# Patient Record
Sex: Female | Born: 1950
Health system: Southern US, Community
[De-identification: ages and names within clinical notes are randomized; demographics above are authoritative.]

## PROBLEM LIST (undated history)

## (undated) DIAGNOSIS — K760 Fatty (change of) liver, not elsewhere classified: Secondary | ICD-10-CM

## (undated) DIAGNOSIS — E785 Hyperlipidemia, unspecified: Secondary | ICD-10-CM

## (undated) DIAGNOSIS — R748 Abnormal levels of other serum enzymes: Secondary | ICD-10-CM

## (undated) DIAGNOSIS — F419 Anxiety disorder, unspecified: Secondary | ICD-10-CM

## (undated) DIAGNOSIS — B019 Varicella without complication: Secondary | ICD-10-CM

## (undated) DIAGNOSIS — I10 Essential (primary) hypertension: Secondary | ICD-10-CM

## (undated) DIAGNOSIS — B029 Zoster without complications: Secondary | ICD-10-CM

## (undated) DIAGNOSIS — Z973 Presence of spectacles and contact lenses: Secondary | ICD-10-CM

## (undated) DIAGNOSIS — R42 Dizziness and giddiness: Secondary | ICD-10-CM

## (undated) DIAGNOSIS — M1990 Unspecified osteoarthritis, unspecified site: Secondary | ICD-10-CM

## (undated) DIAGNOSIS — F32A Depression, unspecified: Secondary | ICD-10-CM

## (undated) DIAGNOSIS — F329 Major depressive disorder, single episode, unspecified: Secondary | ICD-10-CM

## (undated) HISTORY — DX: Hyperlipidemia, unspecified: E78.5

## (undated) HISTORY — DX: Abnormal levels of other serum enzymes: R74.8

## (undated) HISTORY — DX: Varicella without complication: B01.9

## (undated) HISTORY — DX: Anxiety disorder, unspecified: F41.9

## (undated) HISTORY — DX: Essential (primary) hypertension: I10

## (undated) HISTORY — DX: Depression, unspecified: F32.A

## (undated) HISTORY — DX: Major depressive disorder, single episode, unspecified: F32.9

## (undated) HISTORY — DX: Fatty (change of) liver, not elsewhere classified: K76.0

---
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
## 1984-06-23 HISTORY — PX: DILATION AND CURETTAGE OF UTERUS: SHX78

## 2014-10-31 ENCOUNTER — Ambulatory Visit: Payer: Self-pay | Admitting: Internal Medicine

## 2014-11-13 ENCOUNTER — Ambulatory Visit (INDEPENDENT_AMBULATORY_CARE_PROVIDER_SITE_OTHER): Payer: Managed Care, Other (non HMO) | Admitting: Internal Medicine

## 2014-11-13 ENCOUNTER — Encounter (INDEPENDENT_AMBULATORY_CARE_PROVIDER_SITE_OTHER): Payer: Self-pay

## 2014-11-13 ENCOUNTER — Encounter: Payer: Self-pay | Admitting: Internal Medicine

## 2014-11-13 VITALS — BP 150/100 | HR 65 | Temp 98.6°F | Ht 63.75 in | Wt 153.0 lb

## 2014-11-13 DIAGNOSIS — K700 Alcoholic fatty liver: Secondary | ICD-10-CM | POA: Insufficient documentation

## 2014-11-13 DIAGNOSIS — F32A Depression, unspecified: Secondary | ICD-10-CM

## 2014-11-13 DIAGNOSIS — F419 Anxiety disorder, unspecified: Secondary | ICD-10-CM

## 2014-11-13 DIAGNOSIS — Z720 Tobacco use: Secondary | ICD-10-CM

## 2014-11-13 DIAGNOSIS — E785 Hyperlipidemia, unspecified: Secondary | ICD-10-CM | POA: Diagnosis not present

## 2014-11-13 DIAGNOSIS — I10 Essential (primary) hypertension: Secondary | ICD-10-CM | POA: Diagnosis not present

## 2014-11-13 DIAGNOSIS — F329 Major depressive disorder, single episode, unspecified: Secondary | ICD-10-CM | POA: Insufficient documentation

## 2014-11-13 DIAGNOSIS — F418 Other specified anxiety disorders: Secondary | ICD-10-CM

## 2014-11-13 DIAGNOSIS — F17200 Nicotine dependence, unspecified, uncomplicated: Secondary | ICD-10-CM

## 2014-11-13 MED ORDER — ATORVASTATIN CALCIUM 20 MG PO TABS: 20.0000 mg | ORAL_TABLET | Freq: Every day | ORAL | Status: DC

## 2014-11-13 MED ORDER — MIRTAZAPINE 15 MG PO TABS: 15.0000 mg | ORAL_TABLET | Freq: Every day | ORAL | Status: DC

## 2014-11-13 MED ORDER — AMLODIPINE BESYLATE-VALSARTAN 5-320 MG PO TABS: 1.0000 | ORAL_TABLET | Freq: Every day | ORAL | Status: DC

## 2014-11-13 MED ORDER — PRISTIQ 100 MG PO TB24: 100.0000 mg | ORAL_TABLET | Freq: Every day | ORAL | Status: DC

## 2014-11-13 NOTE — Assessment & Plan Note (Signed)
Will check CMET and Lipid profile at her next visit Encouraged her to consume a low fat diet

## 2014-11-13 NOTE — Assessment & Plan Note (Signed)
Handout given on smoking cessation: tips for success

## 2014-11-13 NOTE — Progress Notes (Signed)
Pre visit review using our clinic review tool, if applicable. No additional management support is needed unless otherwise documented below in the visit note. 
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
## 2014-11-13 NOTE — Assessment & Plan Note (Signed)
Discussed how chronic alcohol intake affects the liver She understands but is not ready to abstain at this time Discussed referral for therapy to better help her manage her anxiety and depression so that she can cut back on the alcohol, she declines at this time

## 2014-11-13 NOTE — Assessment & Plan Note (Signed)
BP elevated today but she seems very anxious Will check CBC and CMET at next visit Advised her to keep a record of her home BP and bring them to me at her next visit Discussed DASH diet

## 2014-11-13 NOTE — Patient Instructions (Signed)
Smoking Cessation, Tips for Success  If you are ready to quit smoking, congratulations! You have chosen to help yourself be healthier. Cigarettes bring nicotine, tar, carbon monoxide, and other irritants into your body. Your lungs, heart, and blood vessels will be able to work better without these poisons. There are many different ways to quit smoking. Nicotine gum, nicotine patches, a nicotine inhaler, or nicotine nasal spray can help with physical craving. Hypnosis, support groups, and medicines help break the habit of smoking.  WHAT THINGS CAN I DO TO MAKE QUITTING EASIER?   Here are some tips to help you quit for good:  · Pick a date when you will quit smoking completely. Tell all of your friends and family about your plan to quit on that date.  · Do not try to slowly cut down on the number of cigarettes you are smoking. Pick a quit date and quit smoking completely starting on that day.  · Throw away all cigarettes.    · Clean and remove all ashtrays from your home, work, and car.  · On a card, write down your reasons for quitting. Carry the card with you and read it when you get the urge to smoke.  · Cleanse your body of nicotine. Drink enough water and fluids to keep your urine clear or pale yellow. Do this after quitting to flush the nicotine from your body.  · Learn to predict your moods. Do not let a bad situation be your excuse to have a cigarette. Some situations in your life might tempt you into wanting a cigarette.  · Never have "just one" cigarette. It leads to wanting another and another. Remind yourself of your decision to quit.  · Change habits associated with smoking. If you smoked while driving or when feeling stressed, try other activities to replace smoking. Stand up when drinking your coffee. Brush your teeth after eating. Sit in a different chair when you read the paper. Avoid alcohol while trying to quit, and try to drink fewer caffeinated beverages. Alcohol and caffeine may urge you to  smoke.  · Avoid foods and drinks that can trigger a desire to smoke, such as sugary or spicy foods and alcohol.  · Ask people who smoke not to smoke around you.  · Have something planned to do right after eating or having a cup of coffee. For example, plan to take a walk or exercise.  · Try a relaxation exercise to calm you down and decrease your stress. Remember, you may be tense and nervous for the first 2 weeks after you quit, but this will pass.  · Find new activities to keep your hands busy. Play with a pen, coin, or rubber band. Doodle or draw things on paper.  · Brush your teeth right after eating. This will help cut down on the craving for the taste of tobacco after meals. You can also try mouthwash.    · Use oral substitutes in place of cigarettes. Try using lemon drops, carrots, cinnamon sticks, or chewing gum. Keep them handy so they are available when you have the urge to smoke.  · When you have the urge to smoke, try deep breathing.  · Designate your home as a nonsmoking area.  · If you are a heavy smoker, ask your health care provider about a prescription for nicotine chewing gum. It can ease your withdrawal from nicotine.  · Reward yourself. Set aside the cigarette money you save and buy yourself something nice.  · Look for   support from others. Join a support group or smoking cessation program. Ask someone at home or at work to help you with your plan to quit smoking.  · Always ask yourself, "Do I need this cigarette or is this just a reflex?" Tell yourself, "Today, I choose not to smoke," or "I do not want to smoke." You are reminding yourself of your decision to quit.  · Do not replace cigarette smoking with electronic cigarettes (commonly called e-cigarettes). The safety of e-cigarettes is unknown, and some may contain harmful chemicals.  · If you relapse, do not give up! Plan ahead and think about what you will do the next time you get the urge to smoke.  HOW WILL I FEEL WHEN I QUIT SMOKING?  You  may have symptoms of withdrawal because your body is used to nicotine (the addictive substance in cigarettes). You may crave cigarettes, be irritable, feel very hungry, cough often, get headaches, or have difficulty concentrating. The withdrawal symptoms are only temporary. They are strongest when you first quit but will go away within 10-14 days. When withdrawal symptoms occur, stay in control. Think about your reasons for quitting. Remind yourself that these are signs that your body is healing and getting used to being without cigarettes. Remember that withdrawal symptoms are easier to treat than the major diseases that smoking can cause.   Even after the withdrawal is over, expect periodic urges to smoke. However, these cravings are generally short lived and will go away whether you smoke or not. Do not smoke!  WHAT RESOURCES ARE AVAILABLE TO HELP ME QUIT SMOKING?  Your health care provider can direct you to community resources or hospitals for support, which may include:  · Group support.  · Education.  · Hypnosis.  · Therapy.  Document Released: 03/07/2004 Document Revised: 10/24/2013 Document Reviewed: 11/25/2012  Exit[employer]® Patient Information ©2015 [employer]. This information is not intended to replace advice given to you by your health care provider. Make sure you discuss any questions you have with your health care provider.
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
## 2014-12-08 ENCOUNTER — Encounter: Payer: Self-pay | Admitting: Internal Medicine

## 2014-12-08 ENCOUNTER — Ambulatory Visit (INDEPENDENT_AMBULATORY_CARE_PROVIDER_SITE_OTHER): Payer: Managed Care, Other (non HMO) | Admitting: Internal Medicine

## 2014-12-08 ENCOUNTER — Other Ambulatory Visit (HOSPITAL_COMMUNITY)
Admission: RE | Admit: 2014-12-08 | Discharge: 2014-12-08 | Disposition: A | Discharge status: Home / Self Care | Payer: Managed Care, Other (non HMO) | Source: Ambulatory Visit | Attending: Internal Medicine | Admitting: Internal Medicine

## 2014-12-08 VITALS — BP 148/94 | HR 78 | Temp 98.9°F | Ht 63.75 in | Wt 147.0 lb

## 2014-12-08 DIAGNOSIS — Z13820 Encounter for screening for osteoporosis: Secondary | ICD-10-CM | POA: Diagnosis not present

## 2014-12-08 DIAGNOSIS — Z1211 Encounter for screening for malignant neoplasm of colon: Secondary | ICD-10-CM | POA: Diagnosis not present

## 2014-12-08 DIAGNOSIS — Z01419 Encounter for gynecological examination (general) (routine) without abnormal findings: Secondary | ICD-10-CM | POA: Diagnosis not present

## 2014-12-08 DIAGNOSIS — Z124 Encounter for screening for malignant neoplasm of cervix: Secondary | ICD-10-CM

## 2014-12-08 DIAGNOSIS — Z1239 Encounter for other screening for malignant neoplasm of breast: Secondary | ICD-10-CM

## 2014-12-08 DIAGNOSIS — Z1151 Encounter for screening for human papillomavirus (HPV): Secondary | ICD-10-CM | POA: Insufficient documentation

## 2014-12-08 DIAGNOSIS — Z0000 Encounter for general adult medical examination without abnormal findings: Secondary | ICD-10-CM | POA: Diagnosis not present

## 2014-12-08 LAB — CBC
HCT: 38.9 % (ref 36.0–46.0)
Hemoglobin: 13.3 g/dL (ref 12.0–15.0)
MCH: 32.1 pg (ref 26.0–34.0)
MCHC: 34.2 g/dL (ref 30.0–36.0)
MCV: 94 fL (ref 78.0–100.0)
MPV: 9.9 fL (ref 8.6–12.4)
Platelets: 185 10*3/uL (ref 150–400)
RBC: 4.14 MIL/uL (ref 3.87–5.11)
RDW: 13.3 % (ref 11.5–15.5)
WBC: 6.9 10*3/uL (ref 4.0–10.5)

## 2014-12-08 LAB — COMPREHENSIVE METABOLIC PANEL
ALT: 16 U/L (ref 0–35)
AST: 15 U/L (ref 0–37)
Albumin: 4.4 g/dL (ref 3.5–5.2)
Alkaline Phosphatase: 69 U/L (ref 39–117)
BILIRUBIN TOTAL: 0.5 mg/dL (ref 0.2–1.2)
BUN: 12 mg/dL (ref 6–23)
CHLORIDE: 99 meq/L (ref 96–112)
CO2: 28 mEq/L (ref 19–32)
Calcium: 9.4 mg/dL (ref 8.4–10.5)
Creat: 0.68 mg/dL (ref 0.50–1.10)
Glucose, Bld: 81 mg/dL (ref 70–99)
Potassium: 4.4 mEq/L (ref 3.5–5.3)
Sodium: 135 mEq/L (ref 135–145)
Total Protein: 6.3 g/dL (ref 6.0–8.3)

## 2014-12-08 LAB — LIPID PANEL
Cholesterol: 133 mg/dL (ref 0–200)
HDL: 47 mg/dL (ref >=46)
LDL Cholesterol: 73 mg/dL (ref 0–99)
TRIGLYCERIDES: 67 mg/dL (ref <150)
Total CHOL/HDL Ratio: 2.8 Ratio
VLDL: 13 mg/dL (ref 0–40)

## 2014-12-08 NOTE — Addendum Note (Signed)
Addended by: Roena Malady on: 12/08/2014 03:52 PM   Modules accepted: Orders

## 2014-12-08 NOTE — Progress Notes (Signed)
Pre visit review using our clinic review tool, if applicable. No additional management support is needed unless otherwise documented below in the visit note. 

## 2014-12-08 NOTE — Patient Instructions (Signed)

## 2014-12-08 NOTE — Progress Notes (Signed)
Subjective:    Patient ID: Audrey [name], female    DOB: [birth year], 64 y.o.   MRN: [id number]  HPI  Pt presents to the clinic today for her annual exam.  Flu: 2014 Tetanus: 2012 Zostovax: 12/2013 Pap Smear: > 5 years ago Mammogram,: > 5 years ago Bone Density:  never Colonoocopy: 2013 Vision Screening: as needed Dentist: biannually   Diet: she is on a "stay fit" She does not eat a lot of fast food or processed fruits. She does consume low fat meat, fruits and veggies.  Exercise: She walks 3.7 miles per day.  Review of Systems      Past Medical History  Diagnosis Date  . Hyperlipidemia   . Hypertension   . Depression   . Chicken pox   . Fatty liver   . Elevated liver enzymes     Current Outpatient Prescriptions  Medication Sig Dispense Refill  . amLODipine-valsartan (EXFORGE) 5-320 MG per tablet Take 1 tablet by mouth daily. 90 tablet 1  . atorvastatin (LIPITOR) 20 MG tablet Take 1 tablet (20 mg total) by mouth daily at 6 PM. 90 tablet 1  . metoprolol (LOPRESSOR) 100 MG tablet Take 100 mg by mouth 2 (two) times daily.    . mirtazapine (REMERON) 15 MG tablet Take 1 tablet (15 mg total) by mouth at bedtime. 90 tablet 1  . PRISTIQ 100 MG 24 hr tablet Take 1 tablet (100 mg total) by mouth daily. 90 tablet 1   No current facility-administered medications for this visit.    Allergies  Allergen Reactions  . Zoloft [Sertraline Hcl] Diarrhea  . Lisinopril Rash  . Wellbutrin [Bupropion] Rash    Family History  Problem Relation Age of Onset  . Multiple sclerosis Mother   . Mental illness Mother   . Arthritis Father   . Cancer Maternal Aunt     colon  . Mental illness Maternal Aunt   . Heart disease Maternal Grandmother   . Arthritis Paternal Grandmother   . Hyperlipidemia Paternal Grandmother   . Hypertension Paternal Grandmother   . Heart disease Paternal Grandfather   . Hypertension Paternal Grandfather   . Mental illness Paternal Grandfather      History   Social History  . Marital Status: Married    Spouse Name: N/A  . Number of Children: N/A  . Years of Education: N/A   Occupational History  . Not on file.   Social History Main Topics  . Smoking status: Current Every Day Smoker -- 1.00 packs/day    Types: Cigarettes  . Smokeless tobacco: Never Used  . Alcohol Use: 15.0 oz/week    0 Standard drinks or equivalent, 15 Glasses of wine, 10 Cans of beer per week     Comment: moderate--red wine and beer--daily at times   . Drug Use: Yes    Special: Marijuana  . Sexual Activity: Yes   Other Topics Concern  . Not on file   Social History Narrative     Constitutional: Denies fever, malaise, fatigue, headache or abrupt weight changes.  HEENT: Denies eye pain, eye redness, ear pain, ringing in the ears, wax buildup, runny nose, nasal congestion, bloody nose, or sore throat. Respiratory: Denies difficulty breathing, shortness of breath, cough or sputum production.   Cardiovascular: Denies chest pain, chest tightness, palpitations or swelling in the hands or feet.  Gastrointestinal: Denies abdominal pain, bloating, constipation, diarrhea or blood in the stool.  GU: Denies urgency, frequency, pain with urination, burning sensation, blood in urine,  odor or discharge. Musculoskeletal: Denies decrease in range of motion, difficulty with gait, muscle pain or joint pain and swelling.  Skin: Denies redness, rashes, lesions or ulcercations.  Neurological: Denies dizziness, difficulty with memory, difficulty with speech or problems with balance and coordination.  Psych: Pt reports anxiety. Denies depression, SI/HI.  No other specific complaints in a complete review of systems (except as listed in HPI above).  Objective:   Physical Exam   BP 148/94 mmHg  Pulse 78  Temp(Src) 98.9 F (37.2 C) (Oral)  Ht 5' 3.75" (1.619 m)  Wt 147 lb (66.679 kg)  BMI 25.44 kg/m2  SpO2 98% Wt Readings from Last 3 Encounters:  12/08/14 147  lb (66.679 kg)  11/13/14 153 lb (69.4 kg)    Constitutional:  Alert, oriented x 4, well developed, well nourished in no apparent distress. Skin: Skin is warm and dry.  No erythema, lesion or ulceration noted. HEENT: Head: normal shape and size; Eyes: sclera white, no icterus, conjunctiva pink, PERRLA and EOMs intact; Ears: Tm's gray and intact, normal light reflex; Nose: mucosa pink and moist, septum midline; Throat/Mouth: Teeth present, , mucosa pink and moist, no lesions or ulcerations noted. Neck:  Neck supple, trachea midline. No masses, lumps or thyromegaly present.  Cardiovascular: Normal rate and rhythm. S1,S2 noted.  No murmur, rubs or gallops noted. No JVD or BLE edema. No carotid bruits noted. Pulmonary/Chest: Normal effort and positive vesicular breath sounds. No respiratory distress. No wheezes, rales or ronchi noted.  Abdomenl: Soft and nontender. Normal bowel sounds, no bruits noted. No distention or masses noted. Liver, spleen and kidneys non palpable. Genitourinary: Normal female anatomy. Uterus midline, anterior and soft. No CMT or discharge noted. [place] non palpable. Breast without lumps or masses.  Musculoskeletal: Normal range of motion. Patient exhibits no effusions.  Neurological: Alert and oriented. Cranial nerves II-XII grossly intact. Coordination normal.  Psychiatric: She seems anxious. Behavior is normal. Judgment and thought content normal.        Assessment & Plan:   Preventative Health Maintenance:  Encouraged her to get a flu shot in the fall Tetanus UTD Pap smear obtained today, will call you with the results Mammogram ordered, she will call [facility] to schedule Bone density ordered, see Shirlee Limerick to schedule GI referral for repeat colonoscopy, see Shirlee Limerick to schedule Encouraged her to see a eye doctor at least annually CBC, CMET, Lipid and Vit D today  RTC in 6 months to follow up chronic conditions

## 2014-12-09 LAB — VITAMIN D 25 HYDROXY (VIT D DEFICIENCY, FRACTURES): Vit D, 25-Hydroxy: 51 ng/mL (ref 30–100)

## 2014-12-11 ENCOUNTER — Encounter: Payer: Self-pay | Admitting: Internal Medicine

## 2014-12-12 LAB — CYTOLOGY - PAP

## 2014-12-19 ENCOUNTER — Ambulatory Visit
Admission: RE | Admit: 2014-12-19 | Discharge: 2014-12-19 | Disposition: A | Discharge status: Home / Self Care | Payer: Managed Care, Other (non HMO) | Source: Ambulatory Visit | Attending: Internal Medicine | Admitting: Internal Medicine

## 2014-12-19 DIAGNOSIS — Z1231 Encounter for screening mammogram for malignant neoplasm of breast: Secondary | ICD-10-CM | POA: Diagnosis present

## 2014-12-19 DIAGNOSIS — Z1239 Encounter for other screening for malignant neoplasm of breast: Secondary | ICD-10-CM

## 2014-12-19 DIAGNOSIS — Z13820 Encounter for screening for osteoporosis: Secondary | ICD-10-CM

## 2014-12-21 ENCOUNTER — Encounter: Payer: Managed Care, Other (non HMO) | Admitting: Internal Medicine

## 2015-01-05 ENCOUNTER — Other Ambulatory Visit: Payer: Self-pay

## 2015-01-05 MED ORDER — METOPROLOL SUCCINATE ER 100 MG PO TB24: 100.0000 mg | ORAL_TABLET | Freq: Every day | ORAL | Status: DC

## 2015-01-05 NOTE — Telephone Encounter (Signed)
This medication has not been filled by you, please advise if okay to refill

## 2015-01-05 NOTE — Telephone Encounter (Signed)
Med sent electronically

## 2015-03-01 ENCOUNTER — Encounter: Payer: Self-pay | Admitting: Gastroenterology

## 2015-03-01 ENCOUNTER — Ambulatory Visit (AMBULATORY_SURGERY_CENTER): Payer: Self-pay | Admitting: *Deleted

## 2015-03-01 VITALS — Ht 64.0 in | Wt 156.4 lb

## 2015-03-01 DIAGNOSIS — Z1211 Encounter for screening for malignant neoplasm of colon: Secondary | ICD-10-CM

## 2015-03-01 MED ORDER — NA SULFATE-K SULFATE-MG SULF 17.5-3.13-1.6 GM/177ML PO SOLN: ORAL | Status: DC

## 2015-03-01 NOTE — Progress Notes (Signed)
No allergies to eggs or soy. No problems with anesthesia.  Pt given Emmi instructions for colonoscopy  No oxygen use  No diet drug use  

## 2015-03-15 ENCOUNTER — Encounter: Payer: Self-pay | Admitting: Gastroenterology

## 2015-03-15 ENCOUNTER — Ambulatory Visit (AMBULATORY_SURGERY_CENTER): Payer: Managed Care, Other (non HMO) | Admitting: Gastroenterology

## 2015-03-15 VITALS — BP 123/73 | HR 53 | Temp 98.3°F | Resp 24 | Ht 64.0 in | Wt 156.0 lb

## 2015-03-15 DIAGNOSIS — Z1211 Encounter for screening for malignant neoplasm of colon: Secondary | ICD-10-CM | POA: Diagnosis not present

## 2015-03-15 MED ORDER — SODIUM CHLORIDE 0.9 % IV SOLN: 500.0000 mL | INTRAVENOUS | Status: DC

## 2015-03-15 NOTE — Op Note (Signed)
[facility] [address] [place], [address]   COLONOSCOPY PROCEDURE REPORT  PATIENT: Audrey [name], Audrey [name]  MR#: [id number] BIRTHDATE: September 07, 1950 , 64  yrs. old GENDER: female ENDOSCOPIST: Louis Meckel, MD REFERRED BY: Nicki Reaper, NP PROCEDURE DATE:  03/15/2015 PROCEDURE:   Colonoscopy, screening First Screening Colonoscopy - Avg.  risk and is 50 yrs.  old or older - No.  Prior Negative Screening - Now for repeat screening. 10 or more years since last screening  History of Adenoma - Now for follow-up colonoscopy & has been > or = to 3 yrs.  N/A  Polyps removed today? No Recommend repeat exam, <10 yrs? No ASA CLASS:   Class II INDICATIONS:Colorectal Neoplasm Risk Assessment for this procedure is average risk. MEDICATIONS: Monitored anesthesia care and Propofol 350 mg IV  DESCRIPTION OF PROCEDURE:   After the risks benefits and alternatives of the procedure were thoroughly explained, informed consent was obtained.  The digital rectal exam revealed no abnormalities of the rectum.   The [id number]  endoscope was introduced through the anus and advanced to the cecum, which was identified by both the appendix and ileocecal valve. No adverse events experienced.   The quality of the prep was (Suprep was used) excellent.  The instrument was then slowly withdrawn as the colon was fully examined. Estimated blood loss is zero unless otherwise noted in this procedure report.      COLON FINDINGS: A normal appearing cecum, ileocecal valve, and appendiceal orifice were identified.  The ascending, transverse, descending, sigmoid colon, and rectum appeared unremarkable. Retroflexed views revealed no abnormalities. The time to cecum = 3.7 Withdrawal time = 6.2   The scope was withdrawn and the procedure completed. COMPLICATIONS: There were no immediate complications.  ENDOSCOPIC IMPRESSION: Normal colonoscopy  RECOMMENDATIONS: Continue current colorectal  screening recommendations for "routine risk" patients with a repeat colonoscopy in 10 years.  eSigned:  Louis Meckel, MD 03/15/2015 10:00 AM   cc:

## 2015-03-15 NOTE — Patient Instructions (Signed)
YOU HAD AN ENDOSCOPIC PROCEDURE TODAY AT THE [facility]:   Refer to the procedure report that was given to you for any specific questions about what was found during the examination.  If the procedure report does not answer your questions, please call your gastroenterologist to clarify.  If you requested that your care partner not be given the details of your procedure findings, then the procedure report has been included in a sealed envelope for you to review at your convenience later.  YOU SHOULD EXPECT: Some feelings of bloating in the abdomen. Passage of more gas than usual.  Walking can help get rid of the air that was put into your GI tract during the procedure and reduce the bloating. If you had a lower endoscopy (such as a colonoscopy or flexible sigmoidoscopy) you may notice spotting of blood in your stool or on the toilet paper. If you underwent a bowel prep for your procedure, you may not have a normal bowel movement for a few days.  Please Note:  You might notice some irritation and congestion in your nose or some drainage.  This is from the oxygen used during your procedure.  There is no need for concern and it should clear up in a day or so.  SYMPTOMS TO REPORT IMMEDIATELY:   Following lower endoscopy (colonoscopy or flexible sigmoidoscopy):  Excessive amounts of blood in the stool  Significant tenderness or worsening of abdominal pains  Swelling of the abdomen that is new, acute  Fever of 100F or higher   For urgent or emergent issues, a gastroenterologist can be reached at any hour by calling [phone].   DIET: Your first meal following the procedure should be a small meal and then it is ok to progress to your normal diet. Heavy or fried foods are harder to digest and may make you feel nauseous or bloated.  Likewise, meals heavy in dairy and vegetables can increase bloating.  Drink plenty of fluids but you should avoid alcoholic beverages for 24  hours.  ACTIVITY:  You should plan to take it easy for the rest of today and you should NOT DRIVE or use heavy machinery until tomorrow (because of the sedation medicines used during the test).    FOLLOW UP: Our staff will call the number listed on your records the next business day following your procedure to check on you and address any questions or concerns that you may have regarding the information given to you following your procedure. If we do not reach you, we will leave a message.  However, if you are feeling well and you are not experiencing any problems, there is no need to return our call.  We will assume that you have returned to your regular daily activities without incident.  If any biopsies were taken you will be contacted by phone or by letter within the next 1-3 weeks.  Please call us at [phone] if you have not heard about the biopsies in 3 weeks.    SIGNATURES/CONFIDENTIALITY: You and/or your care partner have signed paperwork which will be entered into your electronic medical record.  These signatures attest to the fact that that the information above on your After Visit Summary has been reviewed and is understood.  Full responsibility of the confidentiality of this discharge information lies with you and/or your care-partner. 

## 2015-03-15 NOTE — Progress Notes (Signed)
Transferred to recovery room. A/O x3, pleased with MAC.  VSS.  Report to Jill, RN. 

## 2015-03-16 ENCOUNTER — Telehealth: Payer: Self-pay | Admitting: Emergency Medicine

## 2015-03-16 NOTE — Telephone Encounter (Signed)
Left message, no identifier 

## 2015-06-04 ENCOUNTER — Ambulatory Visit: Payer: Managed Care, Other (non HMO) | Admitting: Internal Medicine

## 2015-06-06 ENCOUNTER — Ambulatory Visit (INDEPENDENT_AMBULATORY_CARE_PROVIDER_SITE_OTHER)
Admission: RE | Admit: 2015-06-06 | Discharge: 2015-06-06 | Disposition: A | Discharge status: Home / Self Care | Payer: Managed Care, Other (non HMO) | Source: Ambulatory Visit | Attending: Internal Medicine | Admitting: Internal Medicine

## 2015-06-06 ENCOUNTER — Encounter: Payer: Self-pay | Admitting: Internal Medicine

## 2015-06-06 ENCOUNTER — Ambulatory Visit (INDEPENDENT_AMBULATORY_CARE_PROVIDER_SITE_OTHER): Payer: Managed Care, Other (non HMO) | Admitting: Internal Medicine

## 2015-06-06 VITALS — BP 146/88 | HR 78 | Temp 98.3°F | Wt 159.0 lb

## 2015-06-06 DIAGNOSIS — Z720 Tobacco use: Secondary | ICD-10-CM

## 2015-06-06 DIAGNOSIS — Z23 Encounter for immunization: Secondary | ICD-10-CM | POA: Diagnosis not present

## 2015-06-06 DIAGNOSIS — F418 Other specified anxiety disorders: Secondary | ICD-10-CM

## 2015-06-06 DIAGNOSIS — F32A Depression, unspecified: Secondary | ICD-10-CM

## 2015-06-06 DIAGNOSIS — K700 Alcoholic fatty liver: Secondary | ICD-10-CM | POA: Diagnosis not present

## 2015-06-06 DIAGNOSIS — I10 Essential (primary) hypertension: Secondary | ICD-10-CM | POA: Diagnosis not present

## 2015-06-06 DIAGNOSIS — M25551 Pain in right hip: Secondary | ICD-10-CM | POA: Diagnosis not present

## 2015-06-06 DIAGNOSIS — F419 Anxiety disorder, unspecified: Secondary | ICD-10-CM

## 2015-06-06 DIAGNOSIS — F17200 Nicotine dependence, unspecified, uncomplicated: Secondary | ICD-10-CM

## 2015-06-06 DIAGNOSIS — F329 Major depressive disorder, single episode, unspecified: Secondary | ICD-10-CM

## 2015-06-06 DIAGNOSIS — E785 Hyperlipidemia, unspecified: Secondary | ICD-10-CM

## 2015-06-06 NOTE — Patient Instructions (Signed)
Hip Bursitis Bursitis is a swelling and soreness (inflammation) of a fluid-filled sac (bursa). This sac overlies and protects the joints.  CAUSES   Injury.  Overuse of the muscles surrounding the joint.  Arthritis.  Gout.  Infection.  Cold weather.  Inadequate warm-up and conditioning prior to activities. The cause may not be known.  SYMPTOMS   Mild to severe irritation.  Tenderness and swelling over the outside of the hip.  Pain with motion of the hip.  If the bursa becomes infected, a fever may be present. Redness, tenderness, and warmth will develop over the hip. Symptoms usually lessen in 3 to 4 weeks with treatment, but can come back. TREATMENT If conservative treatment does not work, your caregiver may advise draining the bursa and injecting cortisone into the area. This may speed up the healing process. This may also be used as an initial treatment of choice. HOME CARE INSTRUCTIONS   Apply ice to the affected area for 15-20 minutes every 3 to 4 hours while awake for the first 2 days. Put the ice in a plastic bag and place a towel between the bag of ice and your skin.  Rest the painful joint as much as possible, but continue to put the joint through a normal range of motion at least 4 times per day. When the pain lessens, begin normal, slow movements and usual activities to help prevent stiffness of the hip.  Only take over-the-counter or prescription medicines for pain, discomfort, or fever as directed by your caregiver.  Use crutches to limit weight bearing on the hip joint, if advised.  Elevate your painful hip to reduce swelling. Use pillows for propping and cushioning your legs and hips.  Gentle massage may provide comfort and decrease swelling. SEEK IMMEDIATE MEDICAL CARE IF:   Your pain increases even during treatment, or you are not improving.  You have a fever.  You have heat and inflammation over the involved bursa.  You have any other questions or  concerns. MAKE SURE YOU:   Understand these instructions.  Will watch your condition.  Will get help right away if you are not doing well or get worse.   This information is not intended to replace advice given to you by your health care provider. Make sure you discuss any questions you have with your health care provider.   Document Released: 11/29/2001 Document Revised: 09/01/2011 Document Reviewed: 01/09/2015 Elsevier Interactive Patient Education 2016 [employer]vier Inc.  

## 2015-06-06 NOTE — Progress Notes (Signed)
Pre visit review using our clinic review tool, if applicable. No additional management support is needed unless otherwise documented below in the visit note. 

## 2015-06-06 NOTE — Assessment & Plan Note (Signed)
Controlled on Amlodipine-Valsartan and Metoprolol Will get ECG at next annual exam

## 2015-06-06 NOTE — Assessment & Plan Note (Signed)
LDL at goal on Lipitor Encouraged her to consume a low fat diet Advised her to start a baby ASA

## 2015-06-06 NOTE — Assessment & Plan Note (Signed)
Controlled on Pristiq and Remeron Support offered today

## 2015-06-06 NOTE — Assessment & Plan Note (Signed)
Encouraged her to quit 

## 2015-06-06 NOTE — Assessment & Plan Note (Signed)
Encouraged her to cut back on her alcohol intake She is not ready to do this at this time Will check Hep C with annual exam
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
## 2015-06-07 ENCOUNTER — Other Ambulatory Visit: Payer: Self-pay | Admitting: Internal Medicine

## 2015-06-07 DIAGNOSIS — M1611 Unilateral primary osteoarthritis, right hip: Secondary | ICD-10-CM

## 2015-06-08 ENCOUNTER — Other Ambulatory Visit: Payer: Self-pay | Admitting: Internal Medicine

## 2015-06-08 MED ORDER — ALPRAZOLAM 0.5 MG PO TABS: ORAL_TABLET | ORAL | Status: DC

## 2015-06-11 ENCOUNTER — Ambulatory Visit: Payer: Managed Care, Other (non HMO) | Admitting: Internal Medicine

## 2015-06-11 ENCOUNTER — Telehealth: Payer: Self-pay | Admitting: Internal Medicine

## 2015-06-11 NOTE — Telephone Encounter (Signed)
[facility]

## 2015-06-11 NOTE — Telephone Encounter (Signed)
[name]

## 2015-06-11 NOTE — Telephone Encounter (Signed)
Xanax for MRI was called into mail order pharmacy. Please call into local pharmacy [facility] at [phone], [place]. Please advise. MRI is scheduled for tomorrow 06/12/15 at 315p.

## 2015-06-19 ENCOUNTER — Encounter: Payer: Self-pay | Admitting: Family Medicine

## 2015-06-21 ENCOUNTER — Other Ambulatory Visit: Payer: Self-pay | Admitting: Internal Medicine

## 2015-06-21 NOTE — Telephone Encounter (Signed)
Pt wanted to verify multiple meds were refilled today to [facility]; advised pt already done. Pt voiced understanding.

## 2015-07-02 ENCOUNTER — Encounter: Payer: Self-pay | Admitting: Family Medicine

## 2015-07-02 ENCOUNTER — Ambulatory Visit: Payer: Managed Care, Other (non HMO) | Admitting: Family Medicine

## 2015-07-02 ENCOUNTER — Ambulatory Visit (INDEPENDENT_AMBULATORY_CARE_PROVIDER_SITE_OTHER): Payer: Managed Care, Other (non HMO) | Admitting: Family Medicine

## 2015-07-02 VITALS — BP 168/86 | HR 72 | Temp 98.6°F | Ht 63.75 in | Wt 162.5 lb

## 2015-07-02 DIAGNOSIS — M1611 Unilateral primary osteoarthritis, right hip: Secondary | ICD-10-CM

## 2015-07-02 MED ORDER — TRAMADOL HCL 50 MG PO TABS: 50.0000 mg | ORAL_TABLET | Freq: Four times a day (QID) | ORAL | Status: DC | PRN

## 2015-07-02 NOTE — Progress Notes (Signed)
Dr. Karleen Hampshire T. Fatuma Dowers, MD, CAQ [facility] [address] [place], [address] Phone: [phone] Fax: [phone]  07/02/2015  Patient: Audrey [name], MRN: [id number], DOB: 1950-12-17, 65 y.o.  Primary Physician:  Nicki Reaper, NP   Chief Complaint  Patient presents with  . Hip Pain    Right   Subjective:   Audrey [name] is a 65 y.o. very pleasant female patient who presents with the following:  R, Severe hip OA:  Long time bothering her - at least a couple of years. Pain with walking. Able to walk now 3-5 miles. Really trying to get her in shape. She has pain posteriorly as well as anteriorly, it is a deep ache, and it is bothering her much of the time.  She has recently lost 30 pounds, and she has been exercising as much as she can walking a lot, and her hip is really impairing her functionally.  Plain films were reviewed with the patient face-to-face, and they show advanced degenerative changes with flattening of the femoral head.  Radiology raised a concern about potential avascular necrosis, so the patient had a follow-up MRI of the right hip.  I do not have these films for my independent review, but reports indicate that the patient has extensive advanced arthritis without evidence for avascular necrosis.  Lost 30+ pounds. Got a dog.   Hurts the most posteriorly.   Past Medical History, Surgical History, Social History, Family History, Problem List, Medications, and Allergies have been reviewed and updated if relevant.  Patient Active Problem List   Diagnosis Date Noted  . Anxiety and depression 11/13/2014  . HLD (hyperlipidemia) 11/13/2014  . Essential hypertension 11/13/2014  . Smoker 11/13/2014  . Fatty liver, alcoholic 11/13/2014    Past Medical History  Diagnosis Date  . Hyperlipidemia   . Hypertension   . Depression   . Chicken pox   . Fatty liver   . Elevated liver enzymes   . Anxiety     Past Surgical  History  Procedure Laterality Date  . Tonsillectomy  1958  . Pilonidal cyst excision  1972  . Cesarean section  1987, 1989  . Dilation and curettage of uterus  1986    Social History   Social History  . Marital Status: Married    Spouse Name: N/A  . Number of Children: N/A  . Years of Education: N/A   Occupational History  . Not on file.   Social History Main Topics  . Smoking status: Former Smoker -- 1.00 packs/day    Types: Cigarettes    Quit date: 12/28/2014  . Smokeless tobacco: Never Used  . Alcohol Use: 15.0 oz/week    15 Glasses of wine, 10 Cans of beer, 0 Standard drinks or equivalent per week     Comment: moderate--red wine and beer--daily at times   . Drug Use: Yes    Special: Marijuana     Comment: last use: 02/23/15  . Sexual Activity: Yes   Other Topics Concern  . Not on file   Social History Narrative    Family History  Problem Relation Age of Onset  . Multiple sclerosis Mother   . Mental illness Mother   . Arthritis Father   . Mental illness Maternal Aunt   . Colon cancer Maternal Aunt 70  . Heart disease Maternal Grandmother   . Arthritis Paternal Grandmother   . Hyperlipidemia Paternal Grandmother   . Hypertension Paternal Grandmother   . Heart  disease Paternal Grandfather   . Hypertension Paternal Grandfather   . Mental illness Paternal Grandfather   . Stomach cancer Neg Hx   . Rectal cancer Neg Hx   . Esophageal cancer Neg Hx     Allergies  Allergen Reactions  . Zoloft [Sertraline Hcl] Diarrhea  . Lisinopril Rash  . Wellbutrin [Bupropion] Rash    Medication list reviewed and updated in full in [facility].  GEN: No fevers, chills. Nontoxic. Primarily MSK c/o today. MSK: Detailed in the HPI GI: tolerating PO intake without difficulty Neuro: No numbness, parasthesias, or tingling associated. Otherwise the pertinent positives of the ROS are noted above.   Objective:   BP 168/86 mmHg  Pulse 72  Temp(Src) 98.6 F (37 C)  (Oral)  Ht 5' 3.75" (1.619 m)  Wt 162 lb 8 oz (73.71 kg)  BMI 28.12 kg/m2   GEN: WDWN, NAD, Non-toxic, Alert & Oriented x 3 HEENT: Atraumatic, Normocephalic.  Ears and Nose: No external deformity. EXTR: No clubbing/cyanosis/edema NEURO: Normal gait.  PSYCH: Normally interactive. Conversant. Not depressed or anxious appearing.  Calm demeanor.   HIP EXAM: SIDE: R ROM: Abduction, Flexion, Internal and External range of motion: abduction limited to 10 deg. Cannot flex to 90. approx 10-15 deg of rotational movement Pain with terminal IROM and EROM: yes GTB: NT SLR: NEG Knees: No effusion FABER: NT REVERSE FABER: NT, neg Piriformis: NT at direct palpation Str: flexion: 4++/5 abduction: 4++/5 adduction: 5/5 Strength testing non-tender  Radiology: Dg Hip Unilat W Or W/o Pelvis 2-3 Views Right  06/06/2015  CLINICAL DATA:  Right hip pain. EXAM: DG HIP (WITH OR WITHOUT PELVIS) 2-3V RIGHT COMPARISON:  No prior. FINDINGS: Degenerative changes lumbar spine and both hips. Degenerative changes about the right hip per particular severe. Scleroses of the right femoral head noted. Avascular necrosis cannot be excluded. IMPRESSION: Degenerative changes lumbar spine and both hips. Degenerative changes about the right hip are particularly severe. Right femoral head is sclerotic, this may be secondary to degenerative change however avascular necrosis cannot be excluded. MRI of the right hip can be obtained to further evaluate. Electronically Signed   By: Maisie Fus  Register   On: 06/06/2015 16:22    Assessment and Plan:   Primary osteoarthritis of right hip - Plan: Ambulatory referral to Orthopedic Surgery, CANCELED: Ambulatory referral to Orthopedic Surgery  The patient has end-stage degenerative joint disease of the right hip, and I do not think that anything short of the total hip arthroplasty will give the patient significant relief and increase in her functionality.  She failed traditional  conservative management with multiple medications including Tylenol, NSAIDs, tramadol, as well as trousers of exercise.  Consult either Dr. Merlyn Albert or Charlann Boxer for their expertise.   Follow-up: No Follow-up on file.  New Prescriptions   TRAMADOL (ULTRAM) 50 MG TABLET    Take 1 tablet (50 mg total) by mouth every 6 (six) hours as needed.   Modified Medications   No medications on file   Orders Placed This Encounter  Procedures  . Ambulatory referral to Orthopedic Surgery    Signed,  Karleen Hampshire T. Johnie Makki, MD   Patient's Medications  New Prescriptions   TRAMADOL (ULTRAM) 50 MG TABLET    Take 1 tablet (50 mg total) by mouth every 6 (six) hours as needed.  Previous Medications   ALPRAZOLAM (XANAX) 0.5 MG TABLET    Take 1-2 tabs 30 minutes prior to MRI   AMLODIPINE-VALSARTAN (EXFORGE) 5-320 MG TABLET    Take 1  tablet by mouth daily. MUST SCHEDULE ANNUAL PHYSICAL   ATORVASTATIN (LIPITOR) 20 MG TABLET    Take 1 tablet (20 mg total) by mouth daily at 6 PM. MUST SCHEDULE ANNUAL PHYSICAL   IBUPROFEN (ADVIL) 200 MG CAPS    Take by mouth as needed.   METOPROLOL SUCCINATE (TOPROL-XL) 100 MG 24 HR TABLET    Take 1 tablet (100 mg total) by mouth daily. MUST SCHEDULE ANNUAL PHYSICAL   MIRTAZAPINE (REMERON) 15 MG TABLET    Take 1 tablet (15 mg total) by mouth at bedtime. MUST SCHEDULE ANNUAL PHYSICAL   PRISTIQ 100 MG 24 HR TABLET    Take 1 tablet (100 mg total) by mouth daily. MUST SCHEDULE ANNUAL PHYSICAL  Modified Medications   No medications on file  Discontinued Medications   No medications on file

## 2015-07-02 NOTE — Progress Notes (Signed)
Pre visit review using our clinic review tool, if applicable. No additional management support is needed unless otherwise documented below in the visit note. 

## 2015-08-31 ENCOUNTER — Telehealth: Payer: Self-pay | Admitting: Internal Medicine

## 2015-08-31 NOTE — Telephone Encounter (Signed)
Pt called wanting to see if you could get her a handicap sticker .She is having  hip replacement surgery 4/25   She would like the sticker from now until her recovery

## 2015-09-03 NOTE — Telephone Encounter (Signed)
Pt is aware as instructed 

## 2015-09-26 NOTE — H&P (Signed)
TOTAL HIP ADMISSION H&P  Patient is admitted for right total hip arthroplasty, anterior approach.  Subjective:  Chief Complaint:    Right hip primary OA / pain  HPI: [name], 65 y.o. female, has a history of pain and functional disability in the right hip(s) due to arthritis and patient has failed non-surgical conservative treatments for greater than 12 weeks to include NSAID's and/or analgesics and activity modification.  Onset of symptoms was gradual starting 5+ years ago with gradually worsening course since that time.The patient noted no past surgery on the right hip(s).  Patient currently rates pain in the right hip at 8 out of 10 with activity. Patient has night pain, worsening of pain with activity and weight bearing, trendelenberg gait, pain that interfers with activities of daily living and pain with passive range of motion. Patient has evidence of periarticular osteophytes and joint space narrowing by imaging studies. This condition presents safety issues increasing the risk of falls.   There is no current active infection.   Risks, benefits and expectations were discussed with the patient.  Risks including but not limited to the risk of anesthesia, blood clots, nerve damage, blood vessel damage, failure of the prosthesis, infection and up to and including death.  Patient understand the risks, benefits and expectations and wishes to proceed with surgery.   PCP: Nicki Reaper, NP  D/C Plans:      Home with HHPT  Post-op Meds:       No Rx given  Tranexamic Acid:      To be given - IV   Decadron:      Is to be given  FYI:     ASA  Norco    Patient Active Problem List   Diagnosis Date Noted  . Anxiety and depression 11/13/2014  . HLD (hyperlipidemia) 11/13/2014  . Essential hypertension 11/13/2014  . Smoker 11/13/2014  . Fatty liver, alcoholic 11/13/2014   Past Medical History  Diagnosis Date  . Hyperlipidemia   . Hypertension   . Depression   . Chicken pox   .  Fatty liver   . Elevated liver enzymes   . Anxiety     Past Surgical History  Procedure Laterality Date  . Tonsillectomy  1958  . Pilonidal cyst excision  1972  . Cesarean section  1987, 1989  . Dilation and curettage of uterus  1986    No prescriptions prior to admission   Allergies  Allergen Reactions  . Zoloft [Sertraline Hcl] Diarrhea  . Lisinopril Rash  . Wellbutrin [Bupropion] Rash    Social History  Substance Use Topics  . Smoking status: Former Smoker -- 1.00 packs/day    Types: Cigarettes    Quit date: 12/28/2014  . Smokeless tobacco: Never Used  . Alcohol Use: 15.0 oz/week    15 Glasses of wine, 10 Cans of beer, 0 Standard drinks or equivalent per week     Comment: moderate--red wine and beer--daily at times     Family History  Problem Relation Age of Onset  . Multiple sclerosis Mother   . Mental illness Mother   . Arthritis Father   . Mental illness Maternal Aunt   . Colon cancer Maternal Aunt 70  . Heart disease Maternal Grandmother   . Arthritis Paternal Grandmother   . Hyperlipidemia Paternal Grandmother   . Hypertension Paternal Grandmother   . Heart disease Paternal Grandfather   . Hypertension Paternal Grandfather   . Mental illness Paternal Grandfather   . Stomach cancer Neg  Hx   . Rectal cancer Neg Hx   . Esophageal cancer Neg Hx      Review of Systems  Constitutional: Negative.   HENT: Negative.   Eyes: Negative.   Respiratory: Negative.   Cardiovascular: Negative.   Gastrointestinal: Negative.   Genitourinary: Negative.   Musculoskeletal: Positive for back pain and joint pain.  Skin: Negative.   Neurological: Negative.   Endo/Heme/Allergies: Negative.   Psychiatric/Behavioral: Positive for depression. The patient is nervous/anxious.     Objective:  Physical Exam  Constitutional: She is oriented to person, place, and time. She appears well-developed.  HENT:  Head: Normocephalic.  Eyes: Pupils are equal, round, and reactive to  light.  Neck: Neck supple. No JVD present. No tracheal deviation present. No thyromegaly present.  Cardiovascular: Normal rate, regular rhythm, normal heart sounds and intact distal pulses.   Respiratory: Effort normal and breath sounds normal. No stridor. No respiratory distress. She has no wheezes.  GI: Soft. There is no tenderness. There is no guarding.  Musculoskeletal:       Right hip: She exhibits decreased range of motion, decreased strength, tenderness and bony tenderness. She exhibits no swelling, no deformity and no laceration.  Lymphadenopathy:    She has no cervical adenopathy.  Neurological: She is alert and oriented to person, place, and time.  Skin: Skin is warm and dry.  Psychiatric: She has a normal mood and affect.      Labs:  Estimated body mass index is 28.12 kg/(m^2) as calculated from the following:   Height as of 07/02/15: 5' 3.75" (1.619 m).   Weight as of 07/02/15: 73.71 kg (162 lb 8 oz).   Imaging Review Plain radiographs demonstrate severe degenerative joint disease of the right hip(s). The bone quality appears to be good for age and reported activity level.  Assessment/Plan:  End stage arthritis, right hip(s)  The patient history, physical examination, clinical judgement of the provider and imaging studies are consistent with end stage degenerative joint disease of the right hip(s) and total hip arthroplasty is deemed medically necessary. The treatment options including medical management, injection therapy, arthroscopy and arthroplasty were discussed at length. The risks and benefits of total hip arthroplasty were presented and reviewed. The risks due to aseptic loosening, infection, stiffness, dislocation/subluxation,  thromboembolic complications and other imponderables were discussed.  The patient acknowledged the explanation, agreed to proceed with the plan and consent was signed. Patient is being admitted for inpatient treatment for surgery, pain control,  PT, OT, prophylactic antibiotics, VTE prophylaxis, progressive ambulation and ADL's and discharge planning.The patient is planning to be discharged home with home health services.      Anastasio AuerbachMatthew S. Lexiana Spindel   PA-C  09/26/2015, 11:45 AM

## 2015-10-02 ENCOUNTER — Telehealth: Payer: Self-pay | Admitting: *Deleted

## 2015-10-02 NOTE — Telephone Encounter (Signed)
Received a fax from [facility] saying that pt's co-pay for pristiq is $100, but the copay for desvenlafaxine succinate 100mg  tabs is $25, pharmacy wants to see if it's okay to change Rx Order # on fax is [id number]

## 2015-10-03 ENCOUNTER — Other Ambulatory Visit: Payer: Self-pay | Admitting: Internal Medicine

## 2015-10-03 MED ORDER — DESVENLAFAXINE SUCCINATE ER 100 MG PO TB24: 100.0000 mg | ORAL_TABLET | Freq: Every day | ORAL | Status: DC

## 2015-10-03 NOTE — Telephone Encounter (Signed)
Generic sent to mail order  

## 2015-10-10 ENCOUNTER — Encounter (HOSPITAL_COMMUNITY): Payer: Self-pay

## 2015-10-10 ENCOUNTER — Encounter (HOSPITAL_COMMUNITY)
Admission: RE | Admit: 2015-10-10 | Discharge: 2015-10-10 | Disposition: A | Discharge status: Home / Self Care | Payer: Managed Care, Other (non HMO) | Source: Ambulatory Visit | Attending: Orthopedic Surgery | Admitting: Orthopedic Surgery

## 2015-10-10 DIAGNOSIS — Z01818 Encounter for other preprocedural examination: Secondary | ICD-10-CM | POA: Diagnosis present

## 2015-10-10 DIAGNOSIS — I10 Essential (primary) hypertension: Secondary | ICD-10-CM | POA: Insufficient documentation

## 2015-10-10 DIAGNOSIS — Z0183 Encounter for blood typing: Secondary | ICD-10-CM | POA: Diagnosis not present

## 2015-10-10 DIAGNOSIS — M1611 Unilateral primary osteoarthritis, right hip: Secondary | ICD-10-CM | POA: Diagnosis not present

## 2015-10-10 DIAGNOSIS — Z01812 Encounter for preprocedural laboratory examination: Secondary | ICD-10-CM | POA: Diagnosis not present

## 2015-10-10 HISTORY — DX: Unspecified osteoarthritis, unspecified site: M19.90

## 2015-10-10 HISTORY — DX: Dizziness and giddiness: R42

## 2015-10-10 HISTORY — DX: Presence of spectacles and contact lenses: Z97.3

## 2015-10-10 LAB — SURGICAL PCR SCREEN
MRSA, PCR: NEGATIVE
STAPHYLOCOCCUS AUREUS: POSITIVE — AB

## 2015-10-10 LAB — URINALYSIS, ROUTINE W REFLEX MICROSCOPIC
Bilirubin Urine: NEGATIVE
Glucose, UA: NEGATIVE mg/dL
Hgb urine dipstick: NEGATIVE
Ketones, ur: NEGATIVE mg/dL
Nitrite: NEGATIVE
PROTEIN: NEGATIVE mg/dL
SPECIFIC GRAVITY, URINE: 1.014 (ref 1.005–1.030)
pH: 6 (ref 5.0–8.0)

## 2015-10-10 LAB — BASIC METABOLIC PANEL
Anion gap: 7 (ref 5–15)
BUN: 12 mg/dL (ref 6–20)
CO2: 28 mmol/L (ref 22–32)
CREATININE: 0.65 mg/dL (ref 0.44–1.00)
Calcium: 9.4 mg/dL (ref 8.9–10.3)
Chloride: 106 mmol/L (ref 101–111)
GFR calc Af Amer: >60 mL/min (ref >60)
GLUCOSE: 93 mg/dL (ref 65–99)
POTASSIUM: 4.9 mmol/L (ref 3.5–5.1)
SODIUM: 141 mmol/L (ref 135–145)

## 2015-10-10 LAB — CBC
HEMATOCRIT: 37.9 % (ref 36.0–46.0)
Hemoglobin: 12.9 g/dL (ref 12.0–15.0)
MCH: 30.9 pg (ref 26.0–34.0)
MCHC: 34 g/dL (ref 30.0–36.0)
MCV: 90.9 fL (ref 78.0–100.0)
PLATELETS: 173 10*3/uL (ref 150–400)
RBC: 4.17 MIL/uL (ref 3.87–5.11)
RDW: 13.5 % (ref 11.5–15.5)
WBC: 7 10*3/uL (ref 4.0–10.5)

## 2015-10-10 LAB — PROTIME-INR
INR: 1.03 (ref 0.00–1.49)
Prothrombin Time: 13.3 seconds (ref 11.6–15.2)

## 2015-10-10 LAB — APTT: APTT: 28 s (ref 24–37)

## 2015-10-10 LAB — ABO/RH: ABO/RH(D): O POS

## 2015-10-10 LAB — URINE MICROSCOPIC-ADD ON
Bacteria, UA: NONE SEEN
RBC / HPF: NONE SEEN RBC/hpf (ref 0–5)

## 2015-10-10 NOTE — Patient Instructions (Signed)
Audrey [name]  10/10/2015   Your procedure is scheduled on: Tuesday October 16, 2015   Report to [address] [facility] Main  Entrance take [place]  elevators to 3rd floor to  Short Stay Center at 5:00 AM.  Call this number if you have problems the morning of surgery [phone]   Remember: ONLY 1 PERSON MAY GO WITH YOU TO SHORT STAY TO GET  READY MORNING OF YOUR SURGERY.  Do not eat food or drink liquids :After Midnight.     Take these medicines the morning of surgery with A SIP OF WATER: Desvenlafaxine (Pristiq); Metoprolol                                You may not have any metal on your body including hair pins and              piercings  Do not wear jewelry, make-up, lotions, powders or perfumes, deodorant             Do not wear nail polish.  Do not shave  48 hours prior to surgery.                Do not bring valuables to the hospital. [facility] IS NOT             RESPONSIBLE   FOR VALUABLES.  Contacts, dentures or bridgework may not be worn into surgery.  Leave suitcase in the car. After surgery it may be brought to your room.                Please read over the following fact sheets you were given:MRSA INFORMATION SHEET; INCENTIVE SPIROMETER; BLOOD TRANSFUSION INFORMATION SHEET  _____________________________________________________________________             [facility] - Preparing for Surgery Before surgery, you can play an important role.  Because skin is not sterile, your skin needs to be as free of germs as possible.  You can reduce the number of germs on your skin by washing with CHG (chlorahexidine gluconate) soap before surgery.  CHG is an antiseptic cleaner which kills germs and bonds with the skin to continue killing germs even after washing. Please DO NOT use if you have an allergy to CHG or antibacterial soaps.  If your skin becomes reddened/irritated stop using the CHG and inform your nurse when you arrive at Short Stay. Do not shave (including  legs and underarms) for at least 48 hours prior to the first CHG shower.  You may shave your face/neck. Please follow these instructions carefully:  1.  Shower with CHG Soap the night before surgery and the  morning of Surgery.  2.  If you choose to wash your hair, wash your hair first as usual with your  normal  shampoo.  3.  After you shampoo, rinse your hair and body thoroughly to remove the  shampoo.                           4.  Use CHG as you would any other liquid soap.  You can apply chg directly  to the skin and wash                       Gently with a scrungie or clean washcloth.  5.  Apply the CHG Soap to your body ONLY FROM THE NECK DOWN.   Do not use on face/ open                           Wound or open sores. Avoid contact with eyes, ears mouth and genitals (private parts).                       Wash face,  Genitals (private parts) with your normal soap.             6.  Wash thoroughly, paying special attention to the area where your surgery  will be performed.  7.  Thoroughly rinse your body with warm water from the neck down.  8.  DO NOT shower/wash with your normal soap after using and rinsing off  the CHG Soap.                9.  Pat yourself dry with a clean towel.            10.  Wear clean pajamas.            11.  Place clean sheets on your bed the night of your first shower and do not  sleep with pets. Day of Surgery : Do not apply any lotions/deodorants the morning of surgery.  Please wear clean clothes to the hospital/surgery center.  FAILURE TO FOLLOW THESE INSTRUCTIONS MAY RESULT IN THE CANCELLATION OF YOUR SURGERY PATIENT SIGNATURE_________________________________  NURSE SIGNATURE__________________________________  ________________________________________________________________________  WHAT IS A BLOOD TRANSFUSION? Blood Transfusion Information  A transfusion is the replacement of blood or some of its parts. Blood is made up of multiple cells which provide  different functions.  Red blood cells carry oxygen and are used for blood loss replacement.  White blood cells fight against infection.  Platelets control bleeding.  Plasma helps clot blood.  Other blood products are available for specialized needs, such as hemophilia or other clotting disorders. BEFORE THE TRANSFUSION  Who gives blood for transfusions?   Healthy volunteers who are fully evaluated to make sure their blood is safe. This is blood bank blood. Transfusion therapy is the safest it has ever been in the practice of medicine. Before blood is taken from a donor, a complete history is taken to make sure that person has no history of diseases nor engages in risky social behavior (examples are intravenous drug use or sexual activity with multiple partners). The donor's travel history is screened to minimize risk of transmitting infections, such as malaria. The donated blood is tested for signs of infectious diseases, such as HIV and hepatitis. The blood is then tested to be sure it is compatible with you in order to minimize the chance of a transfusion reaction. If you or a relative donates blood, this is often done in anticipation of surgery and is not appropriate for emergency situations. It takes many days to process the donated blood. RISKS AND COMPLICATIONS Although transfusion therapy is very safe and saves many lives, the main dangers of transfusion include:   Getting an infectious disease.  Developing a transfusion reaction. This is an allergic reaction to something in the blood you were given. Every precaution is taken to prevent this. The decision to have a blood transfusion has been considered carefully by your caregiver before blood is given. Blood is not given unless the benefits outweigh the risks. AFTER THE TRANSFUSION  Right after  receiving a blood transfusion, you will usually feel much better and more energetic. This is especially true if your red blood cells have  gotten low (anemic). The transfusion raises the level of the red blood cells which carry oxygen, and this usually causes an energy increase.  The nurse administering the transfusion will monitor you carefully for complications. HOME CARE INSTRUCTIONS  No special instructions are needed after a transfusion. You may find your energy is better. Speak with your caregiver about any limitations on activity for underlying diseases you may have. SEEK MEDICAL CARE IF:   Your condition is not improving after your transfusion.  You develop redness or irritation at the intravenous (IV) site. SEEK IMMEDIATE MEDICAL CARE IF:  Any of the following symptoms occur over the next 12 hours:  Shaking chills.  You have a temperature by mouth above 102 F (38.9 C), not controlled by medicine.  Chest, back, or muscle pain.  People around you feel you are not acting correctly or are confused.  Shortness of breath or difficulty breathing.  Dizziness and fainting.  You get a rash or develop hives.  You have a decrease in urine output.  Your urine turns a dark color or changes to pink, red, or brown. Any of the following symptoms occur over the next 10 days:  You have a temperature by mouth above 102 F (38.9 C), not controlled by medicine.  Shortness of breath.  Weakness after normal activity.  The white part of the eye turns yellow (jaundice).  You have a decrease in the amount of urine or are urinating less often.  Your urine turns a dark color or changes to pink, red, or brown. Document Released: 06/06/2000 Document Revised: 09/01/2011 Document Reviewed: 01/24/2008 [facility] Patient Information 2014 [place], [place].  _______________________________________________________________________

## 2015-10-10 NOTE — Progress Notes (Signed)
Clearance note per chart per Dr Nicki Reaperegina Baity NP 06/06/2015

## 2015-10-11 NOTE — Progress Notes (Signed)
PCR/epic per PAT visit 10/10/2015 positive for STAPH. Results sent to Dr Charlann Boxerlin. Prescription for Mupriocin Ointment called to [facility]. Pt is aware.

## 2015-10-15 NOTE — Anesthesia Preprocedure Evaluation (Addendum)
Anesthesia Evaluation  Patient identified by MRN, date of birth, ID band Patient awake    Reviewed: Allergy & Precautions, NPO status , Patient's Chart, lab work & pertinent test results, reviewed documented beta blocker date and time   Airway Mallampati: I  TM Distance: >3 FB Neck ROM: Full    Dental  (+) Teeth Intact, Dental Advisory Given   Pulmonary former smoker,    breath sounds clear to auscultation       Cardiovascular hypertension, Pt. on medications and Pt. on home beta blockers  Rhythm:Regular Rate:Normal     Neuro/Psych PSYCHIATRIC DISORDERS Anxiety Depression    GI/Hepatic negative GI ROS, Neg liver ROS,   Endo/Other  negative endocrine ROS  Renal/GU negative Renal ROS  negative genitourinary   Musculoskeletal  (+) Arthritis ,   Abdominal Normal abdominal exam  (+)   Peds negative pediatric ROS (+)  Hematology negative hematology ROS (+)   Anesthesia Other Findings - HLD  Reproductive/Obstetrics negative OB ROS                            Lab Results  Component Value Date   WBC 7.0 10/10/2015   HGB 12.9 10/10/2015   HCT 37.9 10/10/2015   MCV 90.9 10/10/2015   PLT 173 10/10/2015   Lab Results  Component Value Date   INR 1.03 10/10/2015   09/2015 EKG: normal sinus rhythm.    Anesthesia Physical Anesthesia Plan  ASA: II  Anesthesia Plan: Spinal   Post-op Pain Management:    Induction: Intravenous  Airway Management Planned: Natural Airway and Simple Face Mask  Additional Equipment:   Intra-op Plan:   Post-operative Plan:   Informed Consent: I have reviewed the patients History and Physical, chart, labs and discussed the procedure including the risks, benefits and alternatives for the proposed anesthesia with the patient or authorized representative who has indicated his/her understanding and acceptance.     Plan Discussed with: CRNA  Anesthesia Plan  Comments:         Anesthesia Quick Evaluation

## 2015-10-16 ENCOUNTER — Inpatient Hospital Stay (HOSPITAL_COMMUNITY): Payer: Managed Care, Other (non HMO) | Admitting: Anesthesiology

## 2015-10-16 ENCOUNTER — Inpatient Hospital Stay (HOSPITAL_COMMUNITY)
Admission: RE | Admit: 2015-10-16 | Discharge: 2015-10-17 | DRG: 470 | Disposition: A | Discharge status: Home Health | Payer: Managed Care, Other (non HMO) | Source: Ambulatory Visit | Attending: Orthopedic Surgery | Admitting: Orthopedic Surgery

## 2015-10-16 ENCOUNTER — Inpatient Hospital Stay (HOSPITAL_COMMUNITY): Payer: Managed Care, Other (non HMO)

## 2015-10-16 ENCOUNTER — Encounter (HOSPITAL_COMMUNITY)
Admission: RE | Disposition: A | Discharge status: Home Health | Payer: Self-pay | Source: Ambulatory Visit | Attending: Orthopedic Surgery

## 2015-10-16 ENCOUNTER — Encounter (HOSPITAL_COMMUNITY): Payer: Self-pay | Admitting: *Deleted

## 2015-10-16 DIAGNOSIS — I10 Essential (primary) hypertension: Secondary | ICD-10-CM | POA: Diagnosis present

## 2015-10-16 DIAGNOSIS — Z6826 Body mass index (BMI) 26.0-26.9, adult: Secondary | ICD-10-CM | POA: Diagnosis not present

## 2015-10-16 DIAGNOSIS — M1611 Unilateral primary osteoarthritis, right hip: Principal | ICD-10-CM | POA: Diagnosis present

## 2015-10-16 DIAGNOSIS — Z8261 Family history of arthritis: Secondary | ICD-10-CM

## 2015-10-16 DIAGNOSIS — Z87891 Personal history of nicotine dependence: Secondary | ICD-10-CM

## 2015-10-16 DIAGNOSIS — Z01812 Encounter for preprocedural laboratory examination: Secondary | ICD-10-CM | POA: Diagnosis not present

## 2015-10-16 DIAGNOSIS — Z96649 Presence of unspecified artificial hip joint: Secondary | ICD-10-CM

## 2015-10-16 DIAGNOSIS — E663 Overweight: Secondary | ICD-10-CM | POA: Diagnosis present

## 2015-10-16 DIAGNOSIS — M25551 Pain in right hip: Secondary | ICD-10-CM | POA: Diagnosis present

## 2015-10-16 HISTORY — PX: TOTAL HIP ARTHROPLASTY: SHX124

## 2015-10-16 LAB — TYPE AND SCREEN
ABO/RH(D): O POS
Antibody Screen: NEGATIVE

## 2015-10-16 SURGERY — ARTHROPLASTY, HIP, TOTAL, ANTERIOR APPROACH
Anesthesia: Spinal | Site: Hip | Laterality: Right

## 2015-10-16 MED ORDER — METHOCARBAMOL 500 MG PO TABS
500.0000 mg | ORAL_TABLET | Freq: Four times a day (QID) | ORAL | Status: DC | PRN
  Filled 2015-10-16: qty 1

## 2015-10-16 MED ORDER — LACTATED RINGERS IV SOLN
INTRAVENOUS | Status: DC | PRN
  Administered 2015-10-16 (×4): via INTRAVENOUS

## 2015-10-16 MED ORDER — MIDAZOLAM HCL 2 MG/2ML IJ SOLN
INTRAMUSCULAR | Status: AC
  Filled 2015-10-16: qty 2

## 2015-10-16 MED ORDER — AMLODIPINE BESYLATE-VALSARTAN 5-320 MG PO TABS: 1.0000 | ORAL_TABLET | Freq: Every day | ORAL | Status: DC

## 2015-10-16 MED ORDER — FENTANYL CITRATE (PF) 100 MCG/2ML IJ SOLN
INTRAMUSCULAR | Status: DC | PRN
  Administered 2015-10-16: 100 ug via INTRAVENOUS

## 2015-10-16 MED ORDER — BISACODYL 10 MG RE SUPP: 10.0000 mg | Freq: Every day | RECTAL | Status: DC | PRN

## 2015-10-16 MED ORDER — DEXAMETHASONE SODIUM PHOSPHATE 10 MG/ML IJ SOLN
10.0000 mg | Freq: Once | INTRAMUSCULAR | Status: AC
  Administered 2015-10-17: 10 mg via INTRAVENOUS
  Filled 2015-10-16: qty 1

## 2015-10-16 MED ORDER — ASPIRIN EC 325 MG PO TBEC
325.0000 mg | DELAYED_RELEASE_TABLET | Freq: Two times a day (BID) | ORAL | Status: DC
  Administered 2015-10-17: 325 mg via ORAL
  Filled 2015-10-16 (×3): qty 1

## 2015-10-16 MED ORDER — TRANEXAMIC ACID 1000 MG/10ML IV SOLN
1000.0000 mg | Freq: Once | INTRAVENOUS | Status: AC
  Administered 2015-10-16: 1000 mg via INTRAVENOUS
  Filled 2015-10-16: qty 10

## 2015-10-16 MED ORDER — HYDROMORPHONE HCL 2 MG/ML IJ SOLN
INTRAMUSCULAR | Status: AC
  Filled 2015-10-16: qty 1

## 2015-10-16 MED ORDER — METOCLOPRAMIDE HCL 10 MG PO TABS: 5.0000 mg | ORAL_TABLET | Freq: Three times a day (TID) | ORAL | Status: DC | PRN

## 2015-10-16 MED ORDER — MIDAZOLAM HCL 5 MG/5ML IJ SOLN
INTRAMUSCULAR | Status: DC | PRN
  Administered 2015-10-16: 2 mg via INTRAVENOUS

## 2015-10-16 MED ORDER — DEXAMETHASONE SODIUM PHOSPHATE 10 MG/ML IJ SOLN
INTRAMUSCULAR | Status: AC
  Filled 2015-10-16: qty 1

## 2015-10-16 MED ORDER — DEXAMETHASONE SODIUM PHOSPHATE 10 MG/ML IJ SOLN
10.0000 mg | Freq: Once | INTRAMUSCULAR | Status: AC
  Administered 2015-10-16: 10 mg via INTRAVENOUS

## 2015-10-16 MED ORDER — PROPOFOL 10 MG/ML IV BOLUS
INTRAVENOUS | Status: AC
  Filled 2015-10-16: qty 40

## 2015-10-16 MED ORDER — MEPERIDINE HCL 50 MG/ML IJ SOLN: 6.2500 mg | INTRAMUSCULAR | Status: DC | PRN

## 2015-10-16 MED ORDER — HYDROMORPHONE HCL 1 MG/ML IJ SOLN
INTRAMUSCULAR | Status: DC | PRN
  Administered 2015-10-16 (×2): 1 mg via INTRAVENOUS

## 2015-10-16 MED ORDER — FERROUS SULFATE 325 (65 FE) MG PO TABS
325.0000 mg | ORAL_TABLET | Freq: Three times a day (TID) | ORAL | Status: DC
  Administered 2015-10-16 – 2015-10-17 (×4): 325 mg via ORAL
  Filled 2015-10-16 (×6): qty 1

## 2015-10-16 MED ORDER — VENLAFAXINE HCL ER 150 MG PO CP24
150.0000 mg | ORAL_CAPSULE | Freq: Every day | ORAL | Status: DC
  Administered 2015-10-17: 150 mg via ORAL
  Filled 2015-10-16 (×2): qty 1

## 2015-10-16 MED ORDER — METOPROLOL SUCCINATE ER 100 MG PO TB24
100.0000 mg | ORAL_TABLET | Freq: Every day | ORAL | Status: DC
  Administered 2015-10-17: 100 mg via ORAL
  Filled 2015-10-16: qty 1

## 2015-10-16 MED ORDER — LACTATED RINGERS IV SOLN: INTRAVENOUS | Status: DC

## 2015-10-16 MED ORDER — AMLODIPINE BESYLATE 5 MG PO TABS
5.0000 mg | ORAL_TABLET | Freq: Every day | ORAL | Status: DC
  Administered 2015-10-16 – 2015-10-17 (×2): 5 mg via ORAL
  Filled 2015-10-16 (×2): qty 1

## 2015-10-16 MED ORDER — ONDANSETRON HCL 4 MG/2ML IJ SOLN
INTRAMUSCULAR | Status: AC
  Filled 2015-10-16: qty 2

## 2015-10-16 MED ORDER — BUPIVACAINE IN DEXTROSE 0.75-8.25 % IT SOLN
INTRATHECAL | Status: DC | PRN
  Administered 2015-10-16: 2 mL via INTRATHECAL

## 2015-10-16 MED ORDER — DIPHENHYDRAMINE HCL 25 MG PO CAPS: 25.0000 mg | ORAL_CAPSULE | Freq: Four times a day (QID) | ORAL | Status: DC | PRN

## 2015-10-16 MED ORDER — HYDROMORPHONE HCL 1 MG/ML IJ SOLN: 0.2500 mg | INTRAMUSCULAR | Status: DC | PRN

## 2015-10-16 MED ORDER — MIRTAZAPINE 15 MG PO TABS
15.0000 mg | ORAL_TABLET | Freq: Every day | ORAL | Status: DC
  Administered 2015-10-16: 15 mg via ORAL
  Filled 2015-10-16 (×2): qty 1

## 2015-10-16 MED ORDER — CHLORHEXIDINE GLUCONATE 4 % EX LIQD: 60.0000 mL | Freq: Once | CUTANEOUS | Status: DC

## 2015-10-16 MED ORDER — ATORVASTATIN CALCIUM 20 MG PO TABS
20.0000 mg | ORAL_TABLET | Freq: Every day | ORAL | Status: DC
  Administered 2015-10-16: 20 mg via ORAL
  Filled 2015-10-16 (×2): qty 1

## 2015-10-16 MED ORDER — POLYETHYLENE GLYCOL 3350 17 G PO PACK
17.0000 g | PACK | Freq: Two times a day (BID) | ORAL | Status: DC
  Administered 2015-10-17: 17 g via ORAL

## 2015-10-16 MED ORDER — PROPOFOL 500 MG/50ML IV EMUL
INTRAVENOUS | Status: DC | PRN
  Administered 2015-10-16: 100 ug/kg/min via INTRAVENOUS

## 2015-10-16 MED ORDER — DEXTROSE 5 % IV SOLN
500.0000 mg | Freq: Four times a day (QID) | INTRAVENOUS | Status: DC | PRN
  Administered 2015-10-16: 500 mg via INTRAVENOUS
  Filled 2015-10-16 (×2): qty 5

## 2015-10-16 MED ORDER — CELECOXIB 200 MG PO CAPS
200.0000 mg | ORAL_CAPSULE | Freq: Two times a day (BID) | ORAL | Status: DC
  Administered 2015-10-16 – 2015-10-17 (×3): 200 mg via ORAL
  Filled 2015-10-16 (×4): qty 1

## 2015-10-16 MED ORDER — MAGNESIUM CITRATE PO SOLN: 1.0000 | Freq: Once | ORAL | Status: DC | PRN

## 2015-10-16 MED ORDER — PHENOL 1.4 % MT LIQD: 1.0000 | OROMUCOSAL | Status: DC | PRN

## 2015-10-16 MED ORDER — SODIUM CHLORIDE 0.9 % IV SOLN
INTRAVENOUS | Status: DC
  Administered 2015-10-16: 13:00:00 via INTRAVENOUS

## 2015-10-16 MED ORDER — ALUM & MAG HYDROXIDE-SIMETH 200-200-20 MG/5ML PO SUSP: 30.0000 mL | ORAL | Status: DC | PRN

## 2015-10-16 MED ORDER — PROPOFOL 10 MG/ML IV BOLUS
INTRAVENOUS | Status: DC | PRN
  Administered 2015-10-16 (×2): 30 mg via INTRAVENOUS

## 2015-10-16 MED ORDER — DOCUSATE SODIUM 100 MG PO CAPS
100.0000 mg | ORAL_CAPSULE | Freq: Two times a day (BID) | ORAL | Status: DC
  Administered 2015-10-16 – 2015-10-17 (×3): 100 mg via ORAL

## 2015-10-16 MED ORDER — 0.9 % SODIUM CHLORIDE (POUR BTL) OPTIME
TOPICAL | Status: DC | PRN
  Administered 2015-10-16: 1000 mL

## 2015-10-16 MED ORDER — FENTANYL CITRATE (PF) 100 MCG/2ML IJ SOLN
INTRAMUSCULAR | Status: AC
  Filled 2015-10-16: qty 2

## 2015-10-16 MED ORDER — ONDANSETRON HCL 4 MG PO TABS: 4.0000 mg | ORAL_TABLET | Freq: Four times a day (QID) | ORAL | Status: DC | PRN

## 2015-10-16 MED ORDER — CEFAZOLIN SODIUM-DEXTROSE 2-4 GM/100ML-% IV SOLN
INTRAVENOUS | Status: AC
  Filled 2015-10-16: qty 100

## 2015-10-16 MED ORDER — CEFAZOLIN SODIUM-DEXTROSE 2-4 GM/100ML-% IV SOLN
2.0000 g | Freq: Four times a day (QID) | INTRAVENOUS | Status: AC
  Administered 2015-10-16 (×2): 2 g via INTRAVENOUS
  Filled 2015-10-16 (×2): qty 100

## 2015-10-16 MED ORDER — HYDROMORPHONE HCL 1 MG/ML IJ SOLN: 0.5000 mg | INTRAMUSCULAR | Status: DC | PRN

## 2015-10-16 MED ORDER — IRBESARTAN 300 MG PO TABS
300.0000 mg | ORAL_TABLET | Freq: Every day | ORAL | Status: DC
  Administered 2015-10-16 – 2015-10-17 (×2): 300 mg via ORAL
  Filled 2015-10-16 (×2): qty 1

## 2015-10-16 MED ORDER — METOCLOPRAMIDE HCL 5 MG/ML IJ SOLN: 5.0000 mg | Freq: Three times a day (TID) | INTRAMUSCULAR | Status: DC | PRN

## 2015-10-16 MED ORDER — MENTHOL 3 MG MT LOZG: 1.0000 | LOZENGE | OROMUCOSAL | Status: DC | PRN

## 2015-10-16 MED ORDER — STERILE WATER FOR IRRIGATION IR SOLN
Status: DC | PRN
  Administered 2015-10-16: 2000 mL

## 2015-10-16 MED ORDER — CEFAZOLIN SODIUM-DEXTROSE 2-4 GM/100ML-% IV SOLN
2.0000 g | INTRAVENOUS | Status: AC
  Administered 2015-10-16: 2 g via INTRAVENOUS

## 2015-10-16 MED ORDER — ONDANSETRON HCL 4 MG/2ML IJ SOLN: 4.0000 mg | Freq: Four times a day (QID) | INTRAMUSCULAR | Status: DC | PRN

## 2015-10-16 MED ORDER — HYDROCODONE-ACETAMINOPHEN 7.5-325 MG PO TABS
1.0000 | ORAL_TABLET | ORAL | Status: DC
  Administered 2015-10-16: 1 via ORAL
  Administered 2015-10-16 (×2): 2 via ORAL
  Administered 2015-10-17 (×4): 1 via ORAL
  Filled 2015-10-16 (×2): qty 1
  Filled 2015-10-16 (×3): qty 2
  Filled 2015-10-16: qty 1

## 2015-10-16 SURGICAL SUPPLY — 36 items
CAPT HIP TOTAL 2 ×2 IMPLANT
CLOTH BEACON ORANGE TIMEOUT ST (SAFETY) ×2 IMPLANT
COVER PERINEAL POST (MISCELLANEOUS) ×2 IMPLANT
DRAPE STERI IOBAN 125X83 (DRAPES) ×2 IMPLANT
DRAPE U-SHAPE 47X51 STRL (DRAPES) ×4 IMPLANT
DRSG AQUACEL AG ADV 3.5X10 (GAUZE/BANDAGES/DRESSINGS) ×2 IMPLANT
DURAPREP 26ML APPLICATOR (WOUND CARE) ×2 IMPLANT
ELECT REM PT RETURN 9FT ADLT (ELECTROSURGICAL) ×2
ELECTRODE REM PT RTRN 9FT ADLT (ELECTROSURGICAL) ×1 IMPLANT
GLOVE BIO SURGEON STRL SZ7.5 (GLOVE) ×2 IMPLANT
GLOVE BIOGEL PI IND STRL 7.0 (GLOVE) ×1 IMPLANT
GLOVE BIOGEL PI IND STRL 7.5 (GLOVE) ×3 IMPLANT
GLOVE BIOGEL PI IND STRL 8 (GLOVE) ×1 IMPLANT
GLOVE BIOGEL PI IND STRL 8.5 (GLOVE) ×1 IMPLANT
GLOVE BIOGEL PI INDICATOR 7.0 (GLOVE) ×1
GLOVE BIOGEL PI INDICATOR 7.5 (GLOVE) ×3
GLOVE BIOGEL PI INDICATOR 8 (GLOVE) ×1
GLOVE BIOGEL PI INDICATOR 8.5 (GLOVE) ×1
GLOVE ECLIPSE 8.0 STRL XLNG CF (GLOVE) ×4 IMPLANT
GLOVE ORTHO TXT STRL SZ7.5 (GLOVE) ×2 IMPLANT
GLOVE SURG SIGNA 7.5 PF LTX (GLOVE) ×2 IMPLANT
GLOVE SURG SS PI 7.5 STRL IVOR (GLOVE) ×2 IMPLANT
GOWN STRL REUS W/ TWL LRG LVL3 (GOWN DISPOSABLE) ×1 IMPLANT
GOWN STRL REUS W/TWL LRG LVL3 (GOWN DISPOSABLE) ×5 IMPLANT
GOWN STRL REUS W/TWL XL LVL3 (GOWN DISPOSABLE) ×4 IMPLANT
HOLDER FOLEY CATH W/STRAP (MISCELLANEOUS) ×2 IMPLANT
LIQUID BAND (GAUZE/BANDAGES/DRESSINGS) ×2 IMPLANT
PACK ANTERIOR HIP CUSTOM (KITS) ×2 IMPLANT
SAW OSC TIP CART 19.5X105X1.3 (SAW) ×2 IMPLANT
SUT MNCRL AB 4-0 PS2 18 (SUTURE) ×2 IMPLANT
SUT VIC AB 1 CT1 36 (SUTURE) ×6 IMPLANT
SUT VIC AB 2-0 CT1 27 (SUTURE) ×4
SUT VIC AB 2-0 CT1 TAPERPNT 27 (SUTURE) ×4 IMPLANT
SUT VLOC 180 0 24IN GS25 (SUTURE) ×2 IMPLANT
TRAY FOLEY W/METER SILVER 14FR (SET/KITS/TRAYS/PACK) ×2 IMPLANT
YANKAUER SUCT BULB TIP 10FT TU (MISCELLANEOUS) ×2 IMPLANT

## 2015-10-16 NOTE — Interval H&P Note (Signed)
History and Physical Interval Note:  10/16/2015 7:12 AM  Audrey PersonLinda V Plaisted  has presented today for surgery, with the diagnosis of RIGHT HIP OA  The various methods of treatment have been discussed with the patient and family. After consideration of risks, benefits and other options for treatment, the patient has consented to  Procedure(s): RIGHT TOTAL HIP ARTHROPLASTY ANTERIOR APPROACH (Right) as a surgical intervention .  The patient's history has been reviewed, patient examined, no change in status, stable for surgery.  I have reviewed the patient's chart and labs.  Questions were answered to the patient's satisfaction.     Shelda PalLIN,Rye Dorado D

## 2015-10-16 NOTE — Anesthesia Postprocedure Evaluation (Signed)
Anesthesia Post Note  Patient: Audrey PersonLinda V [name]  Procedure(s) Performed: Procedure(s) (LRB): RIGHT TOTAL HIP ARTHROPLASTY ANTERIOR APPROACH (Right)  Patient location during evaluation: PACU Anesthesia Type: Spinal Level of consciousness: oriented and awake and alert Pain management: pain level controlled Vital Signs Assessment: post-procedure vital signs reviewed and stable Respiratory status: spontaneous breathing, respiratory function stable and patient connected to nasal cannula oxygen Cardiovascular status: blood pressure returned to baseline and stable Postop Assessment: no headache, no backache and spinal receding Anesthetic complications: no    Last Vitals:  Filed Vitals:   10/16/15 1000 10/16/15 1024  BP: 122/66 132/78  Pulse: 61 61  Temp: 36.8 C 36.8 C  Resp: 12 15    Last Pain: There were no vitals filed for this visit.               Shelton SilvasKevin D Hollis

## 2015-10-16 NOTE — Anesthesia Procedure Notes (Signed)
Spinal Patient location during procedure: OR End time: 10/16/2015 7:29 AM Staffing Performed by: anesthesiologist  Preanesthetic Checklist Completed: patient identified, site marked, surgical consent, pre-op evaluation, timeout performed, IV checked, risks and benefits discussed and monitors and equipment checked Spinal Block Patient position: sitting Prep: ChloraPrep Patient monitoring: heart rate, continuous pulse ox and blood pressure Approach: midline Location: L4-5 Injection technique: single-shot Needle Needle type: Sprotte  Needle gauge: 24 G Needle length: 9 cm Assessment Sensory level: T6 Additional Notes Expiration date of kit checked and confirmed. Patient tolerated procedure well, without complications.

## 2015-10-16 NOTE — Op Note (Signed)
NAME:  Audrey HoughLinda V [name]                ACCOUNT NO.: [id number]      MEDICAL RECORD NO.: [id number]      [facility]      PHYSICIAN:  Durene RomansLIN,Kenlyn Lose D  DATE OF BIRTH:  1950-08-19     DATE OF PROCEDURE:  10/16/2015                                 OPERATIVE REPORT         PREOPERATIVE DIAGNOSIS: Right  hip osteoarthritis.      POSTOPERATIVE DIAGNOSIS:  Right hip osteoarthritis with severe periarticular osteophytes      PROCEDURE:  Right total hip replacement through an anterior approach   utilizing DePuy THR system, component size 52mm pinnacle cup, a size 36+4 neutral   Altrex liner, a size 2 Hi Tri Lock stem with a 36+8.5 delta ceramic   ball.      SURGEON:  Madlyn FrankelMatthew D. Charlann Boxerlin, M.D.      ASSISTANT:  Lanney GinsMatthew Babish, PA-C     ANESTHESIA:  Spinal.      SPECIMENS:  None.      COMPLICATIONS:  None.      BLOOD LOSS:  550 cc     DRAINS:  None      INDICATION OF THE PROCEDURE:  Audrey PersonLinda V [name] is a 65 y.o. female who had   presented to office for evaluation of right hip pain.  Radiographs revealed   progressive degenerative changes with bone-on-bone   articulation to the  hip joint.  The patient had painful limited range of   motion significantly affecting their overall quality of life.  The patient was failing to    respond to conservative measures, and at this point was ready   to proceed with more definitive measures.  The patient has noted progressive   degenerative changes in his hip, progressive problems and dysfunction   with regarding the hip prior to surgery.  Consent was obtained for   benefit of pain relief.  Specific risk of infection, DVT, component   failure, dislocation, need for revision surgery, as well discussion of   the anterior versus posterior approach were reviewed.  Consent was   obtained for benefit of anterior pain relief through an anterior   approach.      PROCEDURE IN DETAIL:  The patient was brought to operative theater.    Once adequate anesthesia, preoperative antibiotics, 2gm of Ancef, 1 gm of Tranexamic Acid, and 10 mg Decadron administered.   The patient was positioned supine on the OSI Hanna table.  Once adequate   padding of boney process was carried out, we had predraped out the hip, and  used fluoroscopy to confirm orientation of the pelvis and position.      The right hip was then prepped and draped from proximal iliac crest to   mid thigh with shower curtain technique.      Time-out was performed identifying the patient, planned procedure, and   extremity.     An incision was then made 2 cm distal and lateral to the   anterior superior iliac spine extending over the orientation of the   tensor fascia lata muscle and sharp dissection was carried down to the   fascia of the muscle and protractor placed in the soft tissues.  The fascia was then incised.  The muscle belly was identified and swept   laterally and retractor placed along the superior neck.  Following   cauterization of the circumflex vessels and removing some pericapsular   fat, a second cobra retractor was placed on the inferior neck.  A third   retractor was placed on the anterior acetabulum after elevating the   anterior rectus.  A L-capsulotomy was along the line of the   superior neck to the trochanteric fossa, then extended proximally and   distally.  Tag sutures were placed and the retractors were then placed   intracapsular.  We then identified the trochanteric fossa and   orientation of my neck cut, confirmed this radiographically   and then made a neck osteotomy with the femur on traction.  The femoral   head was removed without difficulty or complication.  Traction was let   off and retractors were placed posterior and anterior around the   acetabulum.      The labrum and foveal tissue were debrided.  I began reaming with a 45mm   reamer and reamed up to 51mm reamer with good bony bed preparation and a 52mm   cup  was chosen.  The final 52mm Pinnacle cup was then impacted under fluoroscopy  to confirm the depth of penetration and orientation with respect to   abduction.  A screw was placed followed by the hole eliminator.  The final   36+4 neutral Altrex liner was impacted with good visualized rim fit.  The cup was positioned anatomically within the acetabular portion of the pelvis.  I removed significant circumferential acetabular osteophytes.      At this point, the femur was rolled at 80 degrees.  Further capsule was   released off the inferior aspect of the femoral neck.  I then   released the superior capsule proximally.  The hook was placed laterally   along the femur and elevated manually and held in position with the bed   hook.  The leg was then extended and adducted with the leg rolled to 100   degrees of external rotation.  Once the proximal femur was fully   exposed, I used a box osteotome to set orientation.  I then began   broaching with the starting chili pepper broach and passed this by hand and then broached up to 2.  With the 2 broach in place I chose a high offset neck and did several trial reductions.  The offset was appropriate, leg lengths   appeared to be equal best with the +8.5 head ball, confirmed radiographically. I di identify during this process a significant hypertrophic anterior synovial capsular tissue layer which I excised to help prevent impingement issues  Given these findings, I went ahead and dislocated the hip, repositioned all   retractors and positioned the right hip in the extended and abducted position.  The final 2 Hi Tri Lock stem was   chosen and it was impacted down to the level of neck cut.  Based on this   and the trial reduction, a 36+8.5 delta ceramic ball was chosen and   impacted onto a clean and dry trunnion, and the hip was reduced.  The   hip had been irrigated throughout the case again at this point.  The fascia of the   tensor fascia lata muscle was  then reapproximated using #1 Vicryl and #0 V-lock sutures.  The   remaining wound was closed with 2-0 Vicryl and  running 4-0 Monocryl.   The hip was cleaned, dried, and dressed sterilely using Dermabond and   Aquacel dressing.  She was then brought   to recovery room in stable condition tolerating the procedure well.    Lanney Gins, PA-C was present for the entirety of the case involved from   preoperative positioning, perioperative retractor management, general   facilitation of the case, as well as primary wound closure as assistant.            Madlyn Frankel Charlann Boxer, M.D.        10/16/2015 9:01 AM

## 2015-10-16 NOTE — Evaluation (Signed)
Physical Therapy Evaluation Patient Details Name: Audrey [name] PersonLinda V Royce MRN: [id number] DOB: September 19, 1950 Today's Date: 10/16/2015   History of Present Illness  s/p R  DA THA   Clinical Impression  Pt is s/p THA resulting in the deficits listed below (see PT Problem List).   Pt will benefit from skilled PT to increase their independence and safety with mobility to allow discharge to the venue listed below.      Follow Up Recommendations Home health PT;Outpatient PT;No PT follow up (vs)    Equipment Recommendations  None recommended by PT    Recommendations for Other Services       Precautions / Restrictions Precautions Precautions: Anterior Hip;Fall Precaution Comments: per Dr. Charlann Boxerlin pt does have anterior hip precautions; Restrictions Other Position/Activity Restrictions: WBAT      Mobility  Bed Mobility Overal bed mobility: Needs Assistance Bed Mobility: Supine to Sit     Supine to sit: Min assist     General bed mobility comments: min assist with RLE, incr time and cues to self assist  Transfers Overall transfer level: Needs assistance Equipment used: Rolling walker (2 wheeled) Transfers: Sit to/from Stand Sit to Stand: Min assist         General transfer comment: cues for hand placement and LE position  Ambulation/Gait   Ambulation Distance (Feet): 10 Feet Assistive device: Rolling walker (2 wheeled) Gait Pattern/deviations: Step-to pattern;Trunk flexed     General Gait Details: cues for sequence, RW position, upward gaze; pt nauseous after ~9' --chair brought to pt  Stairs            Wheelchair Mobility    Modified Rankin (Stroke Patients Only)       Balance                                             Pertinent Vitals/Pain Pain Assessment: 0-10 Pain Score: 2  Pain Location: right hip Pain Descriptors / Indicators: Sore Pain Intervention(s): Limited activity within patient's tolerance;Monitored during  session;Premedicated before session;Repositioned;Ice applied    Home Living Family/patient expects to be discharged to:: Private residence Living Arrangements: Spouse/significant other   Type of Home: House Home Access: Stairs to enter Entrance Stairs-Rails: Right (not reliable handrail) Entrance Stairs-Number of Steps: 3 Home Layout: One level Home Equipment: None      Prior Function Level of Independence: Independent               Hand Dominance        Extremity/Trunk Assessment   Upper Extremity Assessment: Defer to OT evaluation;Overall WFL for tasks assessed           Lower Extremity Assessment: RLE deficits/detail RLE Deficits / Details: ankle WFL, hip and knee at least 3/5       Communication   Communication: No difficulties  Cognition Arousal/Alertness: Awake/alert Behavior During Therapy: WFL for tasks assessed/performed Overall Cognitive Status: Within Functional Limits for tasks assessed                      General Comments      Exercises Total Joint Exercises Ankle Circles/Pumps: AROM;Both;10 reps (deferred further ex d/t pt nauseous and tearful )      Assessment/Plan    PT Assessment Patient needs continued PT services  PT Diagnosis Difficulty walking   PT Problem List Decreased strength;Decreased range of motion;Decreased activity tolerance;Decreased mobility;Decreased  knowledge of precautions;Decreased safety awareness;Decreased knowledge of use of DME;Pain  PT Treatment Interventions DME instruction;Gait training;Functional mobility training;Stair training;Therapeutic activities;Therapeutic exercise;Patient/family education   PT Goals (Current goals can be found in the Care Plan section) Acute Rehab PT Goals Patient Stated Goal: home PT Goal Formulation: With patient Time For Goal Achievement: 10/19/15 Potential to Achieve Goals: Good    Frequency 7X/week   Barriers to discharge        Co-evaluation                End of Session   Activity Tolerance: Patient tolerated treatment well;Treatment limited secondary to medical complications (Comment) (nausea) Patient left: in chair;with call bell/phone within reach;with family/visitor present;with chair alarm set           Time: 1557-1616 PT Time Calculation (min) (ACUTE ONLY): 19 min   Charges:   PT Evaluation $PT Eval Low Complexity: 1 Procedure     PT G Codes:        Cheryllynn Sarff Nov 02, 2015, 5:17 PM

## 2015-10-16 NOTE — Transfer of Care (Signed)
Immediate Anesthesia Transfer of Care Note  Patient: Audrey [name]  Procedure(s) Performed: Procedure(s): RIGHT TOTAL HIP ARTHROPLASTY ANTERIOR APPROACH (Right)  Patient Location: PACU  Anesthesia Type:Spinal  Level of Consciousness: awake, alert , oriented and patient cooperative  Airway & Oxygen Therapy: Patient Spontanous Breathing and Patient connected to face mask oxygen  Post-op Assessment: Report given to RN, Post -op Vital signs reviewed and stable and Patient moving all extremities X 4  Post vital signs: stable  Last Vitals:  Filed Vitals:   10/16/15 0521  BP: 175/91  Pulse: 76  Temp: 36.7 C  Resp: 20    Complications: No apparent anesthesia complications able to move legs L3 level spinal

## 2015-10-17 ENCOUNTER — Encounter (HOSPITAL_COMMUNITY): Payer: Self-pay | Admitting: Orthopedic Surgery

## 2015-10-17 DIAGNOSIS — E663 Overweight: Secondary | ICD-10-CM | POA: Diagnosis present

## 2015-10-17 LAB — BASIC METABOLIC PANEL
Anion gap: 5 (ref 5–15)
BUN: 13 mg/dL (ref 6–20)
CALCIUM: 8.5 mg/dL — AB (ref 8.9–10.3)
CO2: 28 mmol/L (ref 22–32)
Chloride: 106 mmol/L (ref 101–111)
Creatinine, Ser: 0.54 mg/dL (ref 0.44–1.00)
Glucose, Bld: 147 mg/dL — ABNORMAL HIGH (ref 65–99)
Potassium: 4.6 mmol/L (ref 3.5–5.1)
Sodium: 139 mmol/L (ref 135–145)

## 2015-10-17 LAB — CBC
HCT: 25 % — ABNORMAL LOW (ref 36.0–46.0)
Hemoglobin: 8.5 g/dL — ABNORMAL LOW (ref 12.0–15.0)
MCH: 32 pg (ref 26.0–34.0)
MCHC: 34 g/dL (ref 30.0–36.0)
MCV: 94 fL (ref 78.0–100.0)
PLATELETS: 123 10*3/uL — AB (ref 150–400)
RBC: 2.66 MIL/uL — ABNORMAL LOW (ref 3.87–5.11)
RDW: 13.8 % (ref 11.5–15.5)
WBC: 6.2 10*3/uL (ref 4.0–10.5)

## 2015-10-17 MED ORDER — FERROUS SULFATE 325 (65 FE) MG PO TABS: 325.0000 mg | ORAL_TABLET | Freq: Three times a day (TID) | ORAL | Status: DC

## 2015-10-17 MED ORDER — HYDROCODONE-ACETAMINOPHEN 7.5-325 MG PO TABS: 1.0000 | ORAL_TABLET | ORAL | Status: DC | PRN

## 2015-10-17 MED ORDER — METHOCARBAMOL 500 MG PO TABS: 500.0000 mg | ORAL_TABLET | Freq: Four times a day (QID) | ORAL | Status: DC | PRN

## 2015-10-17 MED ORDER — POLYETHYLENE GLYCOL 3350 17 G PO PACK: 17.0000 g | PACK | Freq: Two times a day (BID) | ORAL | Status: DC

## 2015-10-17 MED ORDER — ASPIRIN 325 MG PO TBEC: 325.0000 mg | DELAYED_RELEASE_TABLET | Freq: Two times a day (BID) | ORAL | Status: AC

## 2015-10-17 MED ORDER — DOCUSATE SODIUM 100 MG PO CAPS: 100.0000 mg | ORAL_CAPSULE | Freq: Two times a day (BID) | ORAL | Status: DC

## 2015-10-17 NOTE — Progress Notes (Signed)
Physical Therapy Treatment Patient Details Name: Audrey [name] PersonLinda V Bergren MRN: [id number] DOB: 1951/06/01 Today's Date: 10/17/2015    History of Present Illness s/p R  DA THA     PT Comments    Pt progressing, will see second time prior to D/C  Follow Up Recommendations  Home health PT;Supervision/Assistance - 24 hour     Equipment Recommendations  None recommended by PT    Recommendations for Other Services       Precautions / Restrictions Precautions Precautions: Anterior Hip;Fall Precaution Comments: per Dr. Charlann Boxerlin pt does have anterior hip precautions; Restrictions Weight Bearing Restrictions: No Other Position/Activity Restrictions: WBAT    Mobility  Bed Mobility Overal bed mobility: Needs Assistance Bed Mobility: Supine to Sit     Supine to sit: Min guard;Min assist     General bed mobility comments: min assist with RLE, incr time and cues to self assist  Transfers Overall transfer level: Needs assistance Equipment used: Rolling walker (2 wheeled) Transfers: Sit to/from Stand Sit to Stand: Min guard;Supervision         General transfer comment: cues for hand placement and LE position  Ambulation/Gait Ambulation/Gait assistance: Min guard Ambulation Distance (Feet): 80 Feet Assistive device: Rolling walker (2 wheeled) Gait Pattern/deviations: Step-to pattern;Antalgic     General Gait Details: cues for sequence, RW position, upward gaze; pt nauseous after ~9' --chair brought to pt   Stairs            Wheelchair Mobility    Modified Rankin (Stroke Patients Only)       Balance                                    Cognition Arousal/Alertness: Awake/alert Behavior During Therapy: WFL for tasks assessed/performed Overall Cognitive Status: Within Functional Limits for tasks assessed                      Exercises Total Joint Exercises Ankle Circles/Pumps: AROM;Both;10 reps Quad Sets: AROM;Strengthening;Both;10 reps Heel  Slides: AAROM;Right;10 reps    General Comments        Pertinent Vitals/Pain Pain Assessment: 0-10 Pain Score: 2  Pain Location: right hip Pain Descriptors / Indicators: Aching Pain Intervention(s): Limited activity within patient's tolerance;Monitored during session;Premedicated before session    Home Living                      Prior Function            PT Goals (current goals can now be found in the care plan section) Acute Rehab PT Goals Patient Stated Goal: home PT Goal Formulation: With patient Time For Goal Achievement: 10/19/15 Potential to Achieve Goals: Good Progress towards PT goals: Progressing toward goals    Frequency  7X/week    PT Plan Current plan remains appropriate    Co-evaluation             End of Session Equipment Utilized During Treatment: Gait belt Activity Tolerance: Patient tolerated treatment well Patient left: in chair;with call bell/phone within reach;with family/visitor present;with chair alarm set     Time: [id number] PT Time Calculation (min) (ACUTE ONLY): 37 min  Charges:  $Gait Training: 8-22 mins $Therapeutic Exercise: 8-22 mins                    G Codes:      Kamaal Cast 10/17/2015, 10:20 AM

## 2015-10-17 NOTE — Care Management Note (Signed)
Case Management Note  Patient Details  Name: Audrey [name] MRN: [id number] Date of Birth: 01-18-1951  Subjective/Objective:                  RIGHT TOTAL HIP ARTHROPLASTY ANTERIOR APPROACH (Right)  Action/Plan: Discharge planning Expected Discharge Date:  10/17/15               Expected Discharge Plan:  [place]  In-House Referral:     Discharge planning Services  CM Consult  Post Acute Care Choice:  Home Health Choice offered to:  Patient  DME Arranged:  Walker rolling DME Agency:  [place]:  PT [facility] Agency:  [facility]  Status of Service:  Completed, signed off  Medicare Important Message Given:    Date Medicare IM Given:    Medicare IM give by:    Date Additional Medicare IM Given:    Additional Medicare Important Message give by:     If discussed at [place] of Stay Meetings, dates discussed:    Additional Comments: CM met with pt in room to offer choice of home health agency.  Pt chooses [facility].  Referral called to [phone] and request for facesheet, orders, F2F, H&P, OP note, and PT eval be faxed.  CM faxed requested information to [phone].  CM called [facility] DME rep, Lecretia to please deliver the rolling walker to room prior to discharge.  No other CM needs were communicated. Dellie Catholic, RN 10/17/2015, 12:20 PM

## 2015-10-17 NOTE — Discharge Instructions (Signed)

## 2015-10-17 NOTE — Progress Notes (Signed)
   10/17/15 1400  PT Visit Information  Last PT Received On 10/17/15  Assistance Needed +1  History of Present Illness s/p R  DA THA   Subjective Data  Patient Stated Goal home  Precautions  Precautions Anterior Hip;Fall  Precaution Comments per Dr. Charlann Boxerlin pt does have anterior hip precautions;  Restrictions  Other Position/Activity Restrictions WBAT  Pain Assessment  Pain Assessment 0-10  Pain Score 1  Pain Location R hip  Pain Descriptors / Indicators Aching  Pain Intervention(s) Limited activity within patient's tolerance;Monitored during session;Premedicated before session;Ice applied  Cognition  Arousal/Alertness Awake/alert  Behavior During Therapy WFL for tasks assessed/performed  Overall Cognitive Status Within Functional Limits for tasks assessed  Bed Mobility  General bed mobility comments oob  Transfers  Equipment used Rolling walker (2 wheeled)  Transfers Sit to/from Stand  Sit to Stand Modified independent (Device/Increase time)  Ambulation/Gait  Ambulation/Gait assistance Supervision  Ambulation Distance (Feet) 90 Feet  Assistive device Rolling walker (2 wheeled)  Gait Pattern/deviations Step-to pattern;Step-through pattern  General Gait Details cues for gait progression  Stairs Yes  Stairs assistance Min guard  Stair Management No rails;Step to pattern;Backwards;With walker  Number of Stairs 1 (x2)  General stair comments cues for sequence  General Comments  General comments (skin integrity, edema, etc.) pt assisted with UB/LB dressing  PT - End of Session  Equipment Utilized During Treatment Gait belt  Activity Tolerance Patient tolerated treatment well  Patient left in chair;with call bell/phone within reach;with family/visitor present;with chair alarm set  PT - Assessment/Plan  PT Plan Current plan remains appropriate  PT Frequency (ACUTE ONLY) 7X/week  Follow Up Recommendations Home health PT;Supervision/Assistance - 24 hour  PT equipment None  recommended by PT  PT Goal Progression  Progress towards PT goals Progressing toward goals  Acute Rehab PT Goals  PT Goal Formulation With patient  Time For Goal Achievement 10/19/15  Potential to Achieve Goals Good  PT Time Calculation  PT Start Time (ACUTE ONLY) 1340  PT Stop Time (ACUTE ONLY) 1410  PT Time Calculation (min) (ACUTE ONLY) 30 min  PT General Charges  $$ ACUTE PT VISIT 1 Procedure  PT Treatments  $Gait Training 23-37 mins

## 2015-10-17 NOTE — Progress Notes (Signed)
Nurse reviewed discharge instructions with patient and husband. Patient and husband voice understanding and have no questions at this time.  

## 2015-10-17 NOTE — Evaluation (Signed)
Occupational Therapy Evaluation Patient Details Name: Audrey PersonLinda V Whiting MRN: [id number] DOB: 1950-08-11 Today's Date: 10/17/2015    History of Present Illness s/p R  DA THA    Clinical Impression   This 65 year old female was admitted for the above surgery. All education was completed. No further OT is needed at this time    Follow Up Recommendations  No OT follow up    Equipment Recommendations  None recommended by OT    Recommendations for Other Services       Precautions / Restrictions Precautions Precautions: Anterior Hip;Fall Precaution Comments: per Dr. Charlann Boxerlin pt does have anterior hip precautions; Restrictions Weight Bearing Restrictions: No Other Position/Activity Restrictions: WBAT      Mobility Bed Mobility Overal bed mobility: Needs Assistance           General bed mobility comments: oob  Transfers Overall transfer level: Needs assistance Equipment used: Rolling walker (2 wheeled) Transfers: Sit to/from Stand Sit to Stand: Supervision         General transfer comment: cues for hand placement and LE position    Balance                                            ADL Overall ADL's : Needs assistance/impaired     Grooming: Oral care;Supervision/safety;Standing       Lower Body Bathing: Supervison/ safety;Sit to/from stand;With adaptive equipment       Lower Body Dressing: Minimal assistance;Sit to/from stand;With adaptive equipment   Toilet Transfer: Min guard;Ambulation;Comfort height toilet;RW   Toileting- ArchitectClothing Manipulation and Hygiene: Min guard;Sit to/from stand         General ADL Comments: educated on shower transfer, but pt did not want to practice.  Practiced with sock aide. She has a long sponge and reacher at home.       Vision     Perception     Praxis      Pertinent Vitals/Pain Pain Assessment: 0-10 Pain Score: 2  Pain Location: R hip Pain Descriptors / Indicators: Aching Pain  Intervention(s): Limited activity within patient's tolerance;Monitored during session;Premedicated before session;Repositioned;Ice applied     Hand Dominance     Extremity/Trunk Assessment Upper Extremity Assessment Upper Extremity Assessment: Overall WFL for tasks assessed           Communication Communication Communication: No difficulties   Cognition Arousal/Alertness: Awake/alert Behavior During Therapy: WFL for tasks assessed/performed Overall Cognitive Status: Within Functional Limits for tasks assessed                     General Comments       Exercises       Shoulder Instructions      Home Living Family/patient expects to be discharged to:: Private residence Living Arrangements: Spouse/significant other Available Help at Discharge: Family               Bathroom Shower/Tub: Walk-in Human resources officershower   Bathroom Toilet: Handicapped height         Additional Comments: has BSC without bucket and AE from husband's surgery      Prior Functioning/Environment Level of Independence: Independent             OT Diagnosis: Acute pain   OT Problem List:     OT Treatment/Interventions:      OT Goals(Current goals can be found in the care plan  section) Acute Rehab OT Goals Patient Stated Goal: home OT Goal Formulation: All assessment and education complete, DC therapy  OT Frequency:     Barriers to D/C:            Co-evaluation              End of Session    Activity Tolerance: Patient tolerated treatment well Patient left: in chair;with call bell/phone within reach;with chair alarm set;with family/visitor present   Time: 1610-9604 OT Time Calculation (min): 35 min Charges:  OT General Charges $OT Visit: 1 Procedure OT Evaluation $OT Eval Low Complexity: 1 Procedure OT Treatments $Self Care/Home Management : 8-22 mins G-Codes:    Karma Hiney 10/27/15, 12:46 PM  [name], OTR/L [phone] 10-27-2015

## 2015-10-17 NOTE — Progress Notes (Signed)
     Subjective: 1 Day Post-Op Procedure(s) (LRB): RIGHT TOTAL HIP ARTHROPLASTY ANTERIOR APPROACH (Right)   Seen by Dr. Charlann Boxerlin. Patient reports pain as mild, pain controlled. No events throughout the night. Ready to be discharged home if she does well with PT.   Objective:   VITALS:   Filed Vitals:   10/17/15 0239 10/17/15 0607  BP: 100/47 120/56  Pulse: 64 64  Temp: 98.3 F (36.8 C) 97.7 F (36.5 C)  Resp: 16 16    Dorsiflexion/Plantar flexion intact Incision: dressing C/D/I No cellulitis present Compartment soft  LABS  Recent Labs  10/17/15 0423  HGB 8.5*  HCT 25.0*  WBC 6.2  PLT 123*     Recent Labs  10/17/15 0423  NA 139  K 4.6  BUN 13  CREATININE 0.54  GLUCOSE 147*     Assessment/Plan: 1 Day Post-Op Procedure(s) (LRB): RIGHT TOTAL HIP ARTHROPLASTY ANTERIOR APPROACH (Right) Foley cath d/c'ed Advance diet Up with therapy D/C IV fluids Discharge home with home health  [facility]. Follow up with OLIN,Mckoy Bhakta D in 2 weeks.  Contact information:  [facility] [address] [place] [id number] [phone]    Overweight (BMI 25-29.9) Estimated body mass index is 26.42 kg/(m^2) as calculated from the following:   Height as of this encounter: 5\' 4"  (1.626 m).   Weight as of this encounter: 69.854 kg (154 lb). Patient also counseled that weight may inhibit the healing process Patient counseled that losing weight will help with future health issues       Anastasio AuerbachMatthew S. Janeisha Ryle   PAC  10/17/2015, 8:28 AM

## 2015-10-23 NOTE — Discharge Summary (Signed)
Physician Discharge Summary  Patient ID: Audrey [name] MRN: [id number] DOB/AGE: May 01, 1951 65 y.o.  Admit date: 10/16/2015 Discharge date: 10/17/2015   Procedures:  Procedure(s) (LRB): RIGHT TOTAL HIP ARTHROPLASTY ANTERIOR APPROACH (Right)  Attending Physician:  Dr. Durene Romans   Admission Diagnoses:   Right hip primary OA / pain  Discharge Diagnoses:  Principal Problem:   S/P right THA, AA Active Problems:   Overweight (BMI 25.0-29.9)  Past Medical History  Diagnosis Date  . Hyperlipidemia   . Hypertension   . Depression   . Chicken pox   . Fatty liver   . Elevated liver enzymes   . Anxiety   . Wears glasses   . Dizziness     when bending over   . Arthritis     HPI:    Audrey [name], 65 y.o. female, has a history of pain and functional disability in the right hip(s) due to arthritis and patient has failed non-surgical conservative treatments for greater than 12 weeks to include NSAID's and/or analgesics and activity modification. Onset of symptoms was gradual starting 5+ years ago with gradually worsening course since that time.The patient noted no past surgery on the right hip(s). Patient currently rates pain in the right hip at 8 out of 10 with activity. Patient has night pain, worsening of pain with activity and weight bearing, trendelenberg gait, pain that interfers with activities of daily living and pain with passive range of motion. Patient has evidence of periarticular osteophytes and joint space narrowing by imaging studies. This condition presents safety issues increasing the risk of falls. There is no current active infection. Risks, benefits and expectations were discussed with the patient. Risks including but not limited to the risk of anesthesia, blood clots, nerve damage, blood vessel damage, failure of the prosthesis, infection and up to and including death. Patient understand the risks, benefits and expectations and wishes to proceed with  surgery.  PCP: Nicki Reaper, NP   Discharged Condition: good  Hospital Course:  Patient underwent the above stated procedure on 10/16/2015. Patient tolerated the procedure well and brought to the recovery room in good condition and subsequently to the floor.  POD #1 BP: 120/56 ; Pulse: 64 ; Temp: 97.7 F (36.5 C) ; Resp: 16 Patient reports pain as mild, pain controlled. No events throughout the night. Ready to be discharged home. Dorsiflexion/plantar flexion intact, incision: dressing C/D/I, no cellulitis present and compartment soft.   LABS  Basename    HGB     8.5  HCT     25.0    Discharge Exam: General appearance: alert, cooperative and no distress Extremities: Homans sign is negative, no sign of DVT, no edema, redness or tenderness in the calves or thighs and no ulcers, gangrene or trophic changes  Disposition: Home with follow up in 2 weeks   Follow-up Information    Follow up with Shelda Pal, MD. Schedule an appointment as soon as possible for a visit in 2 weeks.   Specialty:  Orthopedic Surgery   Contact information:   [address] [place] [address] [phone]       Follow up with [facility].   Specialty:  Home Health Services   Why:  home health physical therapy   Contact information:   [address] [place] [address] [phone]       Follow up with [facility].   Why:  rolling walker   Contact information:  [address] [place] [address] [phone]       Discharge Instructions    Call MD / Call 911    Complete by:  As directed   If you experience chest pain or shortness of breath, CALL 911 and be transported to the hospital emergency room.  If you develope a fever above 101 F, pus (white drainage) or increased drainage or redness at the wound, or calf pain, call your surgeon's office.     Change dressing    Complete by:  As directed   Maintain surgical dressing  until follow up in the clinic. If the edges start to pull up, may reinforce with tape. If the dressing is no longer working, may remove and cover with gauze and tape, but must keep the area dry and clean.  Call with any questions or concerns.     Constipation Prevention    Complete by:  As directed   Drink plenty of fluids.  Prune juice may be helpful.  You may use a stool softener, such as Colace (over the counter) 100 mg twice a day.  Use MiraLax (over the counter) for constipation as needed.     Diet - low sodium heart healthy    Complete by:  As directed      Discharge instructions    Complete by:  As directed   Maintain surgical dressing until follow up in the clinic. If the edges start to pull up, may reinforce with tape. If the dressing is no longer working, may remove and cover with gauze and tape, but must keep the area dry and clean.  [facility] Call with any questions or concerns.     Increase activity slowly as tolerated    Complete by:  As directed   Weight bearing as tolerated with assist device (walker, cane, etc) as directed, use it as long as suggested by your surgeon or therapist, typically at least 4-6 weeks.     TED hose    Complete by:  As directed   Use stockings (TED hose) for 2 weeks on both leg(s).  You may remove them at night for sleeping.             Medication List    STOP taking these medications        acetaminophen 325 MG tablet  Commonly known as:  TYLENOL     ALPRAZolam 0.5 MG tablet  Commonly known as:  XANAX     ibuprofen 200 MG tablet  Commonly known as:  ADVIL,MOTRIN     traMADol 50 MG tablet  Commonly known as:  ULTRAM      TAKE these medications        amLODipine-valsartan 5-320 MG tablet  Commonly known as:  EXFORGE  Take 1 tablet by mouth daily. MUST SCHEDULE ANNUAL PHYSICAL     aspirin 325 MG EC tablet  Take 1 tablet (325 mg total) by mouth 2 (two) times daily.     atorvastatin 20 MG tablet   Commonly known as:  LIPITOR  Take 1 tablet (20 mg total) by mouth daily at 6 PM. MUST SCHEDULE ANNUAL PHYSICAL     desvenlafaxine 100 MG 24 hr tablet  Commonly known as:  PRISTIQ  Take 1 tablet (100 mg total) by mouth daily.     docusate sodium 100 MG capsule  Commonly known as:  COLACE  Take 1 capsule (100 mg total) by mouth 2 (two) times daily.  ferrous sulfate 325 (65 FE) MG tablet  Take 1 tablet (325 mg total) by mouth 3 (three) times daily after meals.     HYDROcodone-acetaminophen 7.5-325 MG tablet  Commonly known as:  NORCO  Take 1-2 tablets by mouth every 4 (four) hours as needed for moderate pain.     methocarbamol 500 MG tablet  Commonly known as:  ROBAXIN  Take 1 tablet (500 mg total) by mouth every 6 (six) hours as needed for muscle spasms.     metoprolol succinate 100 MG 24 hr tablet  Commonly known as:  TOPROL-XL  Take 1 tablet (100 mg total) by mouth daily. MUST SCHEDULE ANNUAL PHYSICAL     mirtazapine 15 MG tablet  Commonly known as:  REMERON  Take 1 tablet (15 mg total) by mouth at bedtime. MUST SCHEDULE ANNUAL PHYSICAL     polyethylene glycol packet  Commonly known as:  MIRALAX / GLYCOLAX  Take 17 g by mouth 2 (two) times daily.         Signed: Anastasio AuerbachMatthew S. Caelyn Route   PA-C  10/23/2015, 3:17 PM

## 2016-02-06 ENCOUNTER — Other Ambulatory Visit: Payer: Self-pay

## 2016-02-06 ENCOUNTER — Other Ambulatory Visit: Payer: Self-pay | Admitting: Internal Medicine

## 2016-02-06 DIAGNOSIS — Z1231 Encounter for screening mammogram for malignant neoplasm of breast: Secondary | ICD-10-CM

## 2016-02-06 MED ORDER — MIRTAZAPINE 15 MG PO TABS: 15.0000 mg | ORAL_TABLET | Freq: Every day | ORAL | 0 refills | Status: DC

## 2016-02-06 MED ORDER — ATORVASTATIN CALCIUM 20 MG PO TABS: 20.0000 mg | ORAL_TABLET | Freq: Every day | ORAL | 0 refills | Status: DC

## 2016-02-06 MED ORDER — AMLODIPINE BESYLATE-VALSARTAN 5-320 MG PO TABS: 1.0000 | ORAL_TABLET | Freq: Every day | ORAL | 0 refills | Status: DC

## 2016-02-06 MED ORDER — METOPROLOL SUCCINATE ER 100 MG PO TB24: 100.0000 mg | ORAL_TABLET | Freq: Every day | ORAL | 0 refills | Status: DC

## 2016-02-06 NOTE — Telephone Encounter (Signed)
Pt has new ins and has cpx scheduled for 03/04/16 but does not have med to last until appt; pt request refill amlodipine-valsartan,atorvastatin, metoprolol succinate and mirtazapine to [facility]. Pt has set up acct with [facility]. Refilled per protocol amlodipine-valsartan,metoprolol succinate and atorvastatin; sending mirtazapine request to R Baity NP.pt voiced understanding.

## 2016-02-07 ENCOUNTER — Ambulatory Visit
Admission: RE | Admit: 2016-02-07 | Discharge: 2016-02-07 | Disposition: A | Discharge status: Home / Self Care | Payer: Managed Care, Other (non HMO) | Source: Ambulatory Visit | Attending: Internal Medicine | Admitting: Internal Medicine

## 2016-02-07 DIAGNOSIS — Z1231 Encounter for screening mammogram for malignant neoplasm of breast: Secondary | ICD-10-CM | POA: Diagnosis not present

## 2016-03-04 ENCOUNTER — Ambulatory Visit (INDEPENDENT_AMBULATORY_CARE_PROVIDER_SITE_OTHER)
Admission: RE | Admit: 2016-03-04 | Discharge: 2016-03-04 | Disposition: A | Discharge status: Home / Self Care | Payer: Managed Care, Other (non HMO) | Source: Ambulatory Visit | Attending: Internal Medicine | Admitting: Internal Medicine

## 2016-03-04 ENCOUNTER — Encounter: Payer: Self-pay | Admitting: Internal Medicine

## 2016-03-04 ENCOUNTER — Ambulatory Visit (INDEPENDENT_AMBULATORY_CARE_PROVIDER_SITE_OTHER): Payer: Managed Care, Other (non HMO) | Admitting: Internal Medicine

## 2016-03-04 VITALS — BP 144/94 | HR 69 | Temp 98.4°F | Ht 64.0 in | Wt 168.5 lb

## 2016-03-04 DIAGNOSIS — Z96649 Presence of unspecified artificial hip joint: Secondary | ICD-10-CM

## 2016-03-04 DIAGNOSIS — F32A Depression, unspecified: Secondary | ICD-10-CM

## 2016-03-04 DIAGNOSIS — K700 Alcoholic fatty liver: Secondary | ICD-10-CM | POA: Diagnosis not present

## 2016-03-04 DIAGNOSIS — Z0000 Encounter for general adult medical examination without abnormal findings: Secondary | ICD-10-CM

## 2016-03-04 DIAGNOSIS — F418 Other specified anxiety disorders: Secondary | ICD-10-CM | POA: Diagnosis not present

## 2016-03-04 DIAGNOSIS — I10 Essential (primary) hypertension: Secondary | ICD-10-CM | POA: Diagnosis not present

## 2016-03-04 DIAGNOSIS — E785 Hyperlipidemia, unspecified: Secondary | ICD-10-CM | POA: Diagnosis not present

## 2016-03-04 DIAGNOSIS — F329 Major depressive disorder, single episode, unspecified: Secondary | ICD-10-CM

## 2016-03-04 DIAGNOSIS — M25531 Pain in right wrist: Secondary | ICD-10-CM

## 2016-03-04 DIAGNOSIS — Z9660 Presence of unspecified orthopedic joint implant: Secondary | ICD-10-CM

## 2016-03-04 DIAGNOSIS — F419 Anxiety disorder, unspecified: Secondary | ICD-10-CM

## 2016-03-04 LAB — LIPID PANEL
CHOL/HDL RATIO: 3
Cholesterol: 159 mg/dL (ref 0–200)
HDL: 55 mg/dL (ref >39.00)
LDL CALC: 89 mg/dL (ref 0–99)
NONHDL: 103.86
TRIGLYCERIDES: 73 mg/dL (ref 0.0–149.0)
VLDL: 14.6 mg/dL (ref 0.0–40.0)

## 2016-03-04 LAB — CBC
HCT: 38.8 % (ref 36.0–46.0)
HEMOGLOBIN: 13.1 g/dL (ref 12.0–15.0)
MCHC: 33.6 g/dL (ref 30.0–36.0)
MCV: 88.7 fl (ref 78.0–100.0)
PLATELETS: 200 10*3/uL (ref 150.0–400.0)
RBC: 4.38 Mil/uL (ref 3.87–5.11)
RDW: 16 % — AB (ref 11.5–15.5)
WBC: 8.8 10*3/uL (ref 4.0–10.5)

## 2016-03-04 LAB — COMPREHENSIVE METABOLIC PANEL
ALT: 15 U/L (ref 0–35)
AST: 15 U/L (ref 0–37)
Albumin: 4.3 g/dL (ref 3.5–5.2)
Alkaline Phosphatase: 136 U/L — ABNORMAL HIGH (ref 39–117)
BILIRUBIN TOTAL: 0.4 mg/dL (ref 0.2–1.2)
BUN: 14 mg/dL (ref 6–23)
CALCIUM: 9.1 mg/dL (ref 8.4–10.5)
CHLORIDE: 101 meq/L (ref 96–112)
CO2: 31 meq/L (ref 19–32)
Creatinine, Ser: 0.65 mg/dL (ref 0.40–1.20)
GFR: 97.41 mL/min (ref >60.00)
GLUCOSE: 89 mg/dL (ref 70–99)
POTASSIUM: 3.9 meq/L (ref 3.5–5.1)
Sodium: 136 mEq/L (ref 135–145)
Total Protein: 6.9 g/dL (ref 6.0–8.3)

## 2016-03-04 LAB — VITAMIN D 25 HYDROXY (VIT D DEFICIENCY, FRACTURES): VITD: 51.83 ng/mL (ref 30.00–100.00)

## 2016-03-04 LAB — HEMOGLOBIN A1C: Hgb A1c MFr Bld: 5.9 % (ref 4.6–6.5)

## 2016-03-04 NOTE — Assessment & Plan Note (Signed)
Will check CMET and Lipid Profile today Encouraged her to  Consume a low fat diet Continue Lipitor unless directed otherwise

## 2016-03-04 NOTE — Assessment & Plan Note (Signed)
Controlled on Exforge and Metoprolol CMET today

## 2016-03-04 NOTE — Assessment & Plan Note (Signed)
CMET today Discussed stopping the alcohol, she is not ready at this time

## 2016-03-04 NOTE — Patient Instructions (Signed)
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
## 2016-03-04 NOTE — Assessment & Plan Note (Signed)
Improved No intervention needed at this time

## 2016-03-04 NOTE — Assessment & Plan Note (Signed)
Controlled on Pristiq and Remeron Support offered today 

## 2016-04-18 ENCOUNTER — Other Ambulatory Visit: Payer: Self-pay

## 2016-04-18 MED ORDER — METOPROLOL SUCCINATE ER 100 MG PO TB24: 100.0000 mg | ORAL_TABLET | Freq: Every day | ORAL | 3 refills | Status: DC

## 2016-04-18 MED ORDER — AMLODIPINE BESYLATE-VALSARTAN 5-320 MG PO TABS: 1.0000 | ORAL_TABLET | Freq: Every day | ORAL | 3 refills | Status: DC

## 2016-04-18 MED ORDER — ATORVASTATIN CALCIUM 20 MG PO TABS: 20.0000 mg | ORAL_TABLET | Freq: Every day | ORAL | 3 refills | Status: DC

## 2016-04-21 ENCOUNTER — Other Ambulatory Visit: Payer: Self-pay

## 2016-04-21 MED ORDER — DESVENLAFAXINE SUCCINATE ER 100 MG PO TB24: 100.0000 mg | ORAL_TABLET | Freq: Every day | ORAL | 1 refills | Status: DC

## 2016-04-23 NOTE — Telephone Encounter (Signed)
Pt request status of generic pristiq to [employer]; advised pt sent electronically on 04/21/16. Pt will ck with pharmacy.

## 2016-05-28 ENCOUNTER — Other Ambulatory Visit: Payer: Self-pay

## 2016-05-28 MED ORDER — MIRTAZAPINE 15 MG PO TABS: 15.0000 mg | ORAL_TABLET | Freq: Every day | ORAL | 0 refills | Status: DC

## 2016-05-28 NOTE — Telephone Encounter (Signed)
Pt request refill mirtazapine to [employer] home delivery. Last annual 03/04/16 and last refilled # 90 on 02/06/16.pt advised refill done per protocol and pt voiced understanding.

## 2016-06-06 ENCOUNTER — Ambulatory Visit (INDEPENDENT_AMBULATORY_CARE_PROVIDER_SITE_OTHER): Payer: Managed Care, Other (non HMO) | Admitting: Family Medicine

## 2016-06-06 ENCOUNTER — Encounter: Payer: Self-pay | Admitting: Family Medicine

## 2016-06-06 DIAGNOSIS — R202 Paresthesia of skin: Secondary | ICD-10-CM

## 2016-06-06 DIAGNOSIS — M545 Low back pain, unspecified: Secondary | ICD-10-CM

## 2016-06-06 DIAGNOSIS — M25552 Pain in left hip: Secondary | ICD-10-CM | POA: Diagnosis not present

## 2016-06-06 DIAGNOSIS — Z23 Encounter for immunization: Secondary | ICD-10-CM | POA: Diagnosis not present

## 2016-06-06 DIAGNOSIS — R200 Anesthesia of skin: Secondary | ICD-10-CM | POA: Diagnosis not present

## 2016-06-06 MED ORDER — PREDNISONE 20 MG PO TABS: 40.0000 mg | ORAL_TABLET | Freq: Every day | ORAL | 0 refills | Status: DC

## 2016-06-06 NOTE — Progress Notes (Signed)
Subjective:    Patient ID: Audrey [name], female    DOB: [birth year], 65 y.o.   MRN: [id number]  HPI This is a very pleasant 65 yo female who presents today with left sided low back pain and back spasm x 2 weeks.  Has had left hip pain off and on x several months. This feels similar to how right hip pain started.  Had hip replacement 4/17. Right hip has started clicking over last couple of months- she can hear it. No pain in right hip. Did great with surgery and post op course was smooth, she was feeling great until left hip started to hurt. Surgeon was [facility] Olin. Walks 5 miles a day. Had trouble doing her daily walk yesterday. Takes advil 2 tablets 1-2 times a day with some relief.   Has numbness and tingling of right arm from hand up arm for 1-2 weeks. Remembers sleeping funny prior to pain starting and also was out of town sleeping on different bed. No headache. No weakness. Neck muscles feel tight and noticed decreased ROM to right. She is left handed.   Had eye exam last week and bp was elevated 190/90. Takes metoprolol in am and switched amlodipine-valsartan to the morning. Checks blood pressure at home. Smokes 1 ppd, had stopped for several months and restarted. Has decreased alcohol consumption to only Sunday nights- was drinking several glasses to a bottle of wine most nights.   Past Medical History:  Diagnosis Date  . Anxiety   . Arthritis   . Chicken pox   . Depression   . Dizziness    when bending over   . Elevated liver enzymes   . Fatty liver   . Hyperlipidemia   . Hypertension   . Wears glasses    Past Surgical History:  Procedure Laterality Date  . CESAREAN SECTION  1987, 1989  . DILATION AND CURETTAGE OF UTERUS  1986  . PILONIDAL CYST EXCISION  1972  . TONSILLECTOMY  1958  . TOTAL HIP ARTHROPLASTY Right 10/16/2015   Procedure: RIGHT TOTAL HIP ARTHROPLASTY ANTERIOR APPROACH;  Surgeon: Durene RomansMatthew Olin, MD;  Location: [facility];  Service: Orthopedics;  Laterality: Right;     Family History  Problem Relation Age of Onset  . Multiple sclerosis Mother   . Mental illness Mother   . Arthritis Father   . Mental illness Maternal Aunt   . Colon cancer Maternal Aunt 70  . Heart disease Maternal Grandmother   . Arthritis Paternal Grandmother   . Hyperlipidemia Paternal Grandmother   . Hypertension Paternal Grandmother   . Heart disease Paternal Grandfather   . Hypertension Paternal Grandfather   . Mental illness Paternal Grandfather   . Stomach cancer Neg Hx   . Rectal cancer Neg Hx   . Esophageal cancer Neg Hx    Social History  Substance Use Topics  . Smoking status: Former Smoker    Packs/day: 1.00    Years: 50.00    Types: Cigarettes    Quit date: 09/23/2015  . Smokeless tobacco: Never Used  . Alcohol use 15.0 oz/week    15 Glasses of wine, 10 Cans of beer per week     Comment: moderate--red wine and beer--daily at times      Review of Systems Per HPI    Objective:   Physical Exam  Constitutional: She is oriented to person, place, and time. She appears well-developed and well-nourished. No distress.  HENT:  Head: Normocephalic and atraumatic.  Eyes: Conjunctivae are normal.  Pupils are equal, round, and reactive to light.  Neck: Normal range of motion. Neck supple. Muscular tenderness present. No spinous process tenderness present.  Cardiovascular: Normal rate, regular rhythm and normal heart sounds.   Pulmonary/Chest: Effort normal and breath sounds normal.  Musculoskeletal:       Right hip: She exhibits normal range of motion, normal strength and no tenderness.       Left hip: She exhibits normal range of motion, normal strength, no tenderness and no bony tenderness.  Unable to reproduce click in right hip.  Pain with adduction and abduction, radiates to back.   Lymphadenopathy:    She has no cervical adenopathy.  Neurological: She is alert and oriented to person, place, and time.  Skin: Skin is warm and dry. She is not diaphoretic.   Psychiatric: She has a normal mood and affect. Her behavior is normal. Judgment and thought content normal.  Vitals reviewed.     BP (!) 144/80   Pulse 69   Temp 98.4 F (36.9 C) (Oral)   Wt 165 lb 8 oz (75.1 kg)   SpO2 97%   BMI 28.41 kg/m  Wt Readings from Last 3 Encounters:  06/06/16 165 lb 8 oz (75.1 kg)  03/04/16 168 lb 8 oz (76.4 kg)  10/16/15 154 lb (69.9 kg)       Assessment & Plan:  1. Numbness and tingling of right arm - acute onset, suspect pinched nerve in neck, no worrisome exam findings - predniSONE (DELTASONE) 20 MG tablet; Take 2 tablets (40 mg total) by mouth daily with breakfast.  Dispense: 10 tablet; Refill: 0- she was instructed to avoid NSAIDs while taking prednisone - follow up if no improvement in 5-7 days, sooner if worsening symptoms  2. Left hip pain - has history or right hip replacement and may need consideration for replacement of left hip, will have her go back to Dr. Charlann Boxerlin - Ambulatory referral to Orthopedic Surgery  3. Acute left-sided low back pain without sciatica - exam suggests this is related to left hip, encouraged her to do some gentle back exercises and walk shorter distances  4. Need for influenza vaccination - Flu Vaccine QUAD 36+ mos PF IM (Fluarix & Fluzone Quad PF)   Olean Reeeborah Gessner, FNP-BC  [facility], [facility]  06/06/2016 11:50 AM

## 2016-06-06 NOTE — Patient Instructions (Signed)

## 2016-07-14 ENCOUNTER — Telehealth: Payer: Self-pay | Admitting: *Deleted

## 2016-07-14 NOTE — Telephone Encounter (Signed)
PT dropped of a clearance form to be filled out. Please fill it out and fax to [phone]. Form placed in Rx tower.

## 2016-07-16 NOTE — Telephone Encounter (Signed)
Done, given to MYD to fax 

## 2016-07-17 NOTE — Telephone Encounter (Signed)
Form faxed to [facility]

## 2016-08-11 ENCOUNTER — Other Ambulatory Visit (HOSPITAL_COMMUNITY): Payer: Self-pay | Admitting: Emergency Medicine

## 2016-08-11 NOTE — H&P (Signed)
TOTAL HIP ADMISSION H&P  Patient is admitted for left total hip arthroplasty, anterior approach.  Subjective:  Chief Complaint:   Left hip primary OA / pain  HPI: Audrey [name], 67 y.o. female, has a history of pain and functional disability in the left hip(s) due to arthritis and patient has failed non-surgical conservative treatments for greater than 12 weeks to include NSAID's and/or analgesics, use of assistive devices and activity modification.  Onset of symptoms was abrupt starting 5 months ago with rapidlly worsening course since that time.The patient noted prior procedures of the hip to include arthroplasty on the right hip in April 2017.  Patient currently rates pain in the left hip at 8 out of 10 with activity. Patient has night pain, worsening of pain with activity and weight bearing, trendelenberg gait, pain that interfers with activities of daily living and pain with passive range of motion. Patient has evidence of periarticular osteophytes and joint space narrowing by imaging studies. This condition presents safety issues increasing the risk of falls.  There is no current active infection.   Risks, benefits and expectations were discussed with the patient.  Risks including but not limited to the risk of anesthesia, blood clots, nerve damage, blood vessel damage, failure of the prosthesis, infection and up to and including death.  Patient understand the risks, benefits and expectations and wishes to proceed with surgery.   PCP: Nicki Reaper, NP  D/C Plans:       Home - No PT  Post-op Meds:       No Rx given   Tranexamic Acid:      To be given - IV  Decadron:      Is to be given  FYI:     ASA  Norco    Patient Active Problem List   Diagnosis Date Noted  . Overweight (BMI 25.0-29.9) 10/17/2015  . S/P right THA, AA 10/16/2015  . Anxiety and depression 11/13/2014  . HLD (hyperlipidemia) 11/13/2014  . Essential hypertension 11/13/2014  . Smoker 11/13/2014  . Fatty liver,  alcoholic 11/13/2014   Past Medical History:  Diagnosis Date  . Anxiety   . Arthritis   . Chicken pox   . Depression   . Dizziness    when bending over   . Elevated liver enzymes   . Fatty liver   . Hyperlipidemia   . Hypertension   . Wears glasses     Past Surgical History:  Procedure Laterality Date  . CESAREAN SECTION  1987, 1989  . DILATION AND CURETTAGE OF UTERUS  1986  . PILONIDAL CYST EXCISION  1972  . TONSILLECTOMY  1958  . TOTAL HIP ARTHROPLASTY Right 10/16/2015   Procedure: RIGHT TOTAL HIP ARTHROPLASTY ANTERIOR APPROACH;  Surgeon: Durene Romans, MD;  Location: [facility];  Service: Orthopedics;  Laterality: Right;    No prescriptions prior to admission.   Allergies  Allergen Reactions  . Zoloft [Sertraline Hcl] Diarrhea  . Lisinopril Rash  . Wellbutrin [Bupropion] Rash    Social History  Substance Use Topics  . Smoking status: Former Smoker    Packs/day: 1.00    Years: 50.00    Types: Cigarettes    Quit date: 09/23/2015  . Smokeless tobacco: Never Used  . Alcohol use 15.0 oz/week    15 Glasses of wine, 10 Cans of beer per week     Comment: moderate--red wine and beer--daily at times     Family History  Problem Relation Age of Onset  . Multiple sclerosis  Mother   . Mental illness Mother   . Arthritis Father   . Mental illness Maternal Aunt   . Colon cancer Maternal Aunt 70  . Heart disease Maternal Grandmother   . Arthritis Paternal Grandmother   . Hyperlipidemia Paternal Grandmother   . Hypertension Paternal Grandmother   . Heart disease Paternal Grandfather   . Hypertension Paternal Grandfather   . Mental illness Paternal Grandfather   . Stomach cancer Neg Hx   . Rectal cancer Neg Hx   . Esophageal cancer Neg Hx      Review of Systems  Constitutional: Negative.   HENT: Negative.   Eyes: Negative.   Respiratory: Negative.   Cardiovascular: Negative.   Gastrointestinal: Negative.   Genitourinary: Negative.   Musculoskeletal: Positive for joint  pain.  Skin: Negative.   Neurological: Negative.   Endo/Heme/Allergies: Negative.   Psychiatric/Behavioral: Positive for depression. The patient is nervous/anxious.     Objective:  Physical Exam  Constitutional: She is oriented to [name], place, and time. She appears well-developed.  HENT:  Head: Normocephalic.  Eyes: Pupils are equal, round, and reactive to light.  Neck: Neck supple. No JVD present. No tracheal deviation present. No thyromegaly present.  Cardiovascular: Normal rate, regular rhythm, normal heart sounds and intact distal pulses.   Respiratory: Effort normal and breath sounds normal. No stridor. No respiratory distress. She has no wheezes.  GI: Soft. There is no tenderness. There is no guarding.  Musculoskeletal:       Left hip: She exhibits decreased range of motion, decreased strength, tenderness and bony tenderness. She exhibits no swelling, no deformity and no laceration.  Lymphadenopathy:    She has no cervical adenopathy.  Neurological: She is alert and oriented to [name], place, and time.  Skin: Skin is warm and dry.  Psychiatric: She has a normal mood and affect.      Labs:  Estimated body mass index is 28.41 kg/m as calculated from the following:   Height as of 03/04/16: 5\' 4"  (1.626 m).   Weight as of 06/06/16: 75.1 kg (165 lb 8 oz).   Imaging Review Plain radiographs demonstrate severe degenerative joint disease of the left hip(s). The bone quality appears to be good for age and reported activity level.  Assessment/Plan:  End stage arthritis, left hip(s)  The patient history, physical examination, clinical judgement of the provider and imaging studies are consistent with end stage degenerative joint disease of the left hip(s) and total hip arthroplasty is deemed medically necessary. The treatment options including medical management, injection therapy, arthroscopy and arthroplasty were discussed at length. The risks and benefits of total hip  arthroplasty were presented and reviewed. The risks due to aseptic loosening, infection, stiffness, dislocation/subluxation,  thromboembolic complications and other imponderables were discussed.  The patient acknowledged the explanation, agreed to proceed with the plan and consent was signed. Patient is being admitted for inpatient treatment for surgery, pain control, PT, OT, prophylactic antibiotics, VTE prophylaxis, progressive ambulation and ADL's and discharge planning.The patient is planning to be discharged home.     Anastasio AuerbachMatthew S. Cuauhtemoc Huegel   PA-C  08/11/2016, 5:08 PM

## 2016-08-11 NOTE — Patient Instructions (Signed)
Audrey [name]  08/11/2016   Your procedure is scheduled on: 08-19-16  Report to [facility] Main  Entrance take [facility]  elevators to 3rd floor to  Short Stay Center at 630AM.  Call this number if you have problems the morning of surgery [phone]    Remember: ONLY 1 PERSON MAY GO WITH YOU TO SHORT STAY TO GET  READY MORNING OF YOUR SURGERY.  Do not eat food or drink liquids :After Midnight.     Take these medicines the morning of surgery with A SIP OF WATER: desvenlafaxine(pristiq),  Metoprolol (toprol xl)                                You may not have any metal on your body including hair pins and              piercings  Do not wear jewelry, make-up, lotions, powders or perfumes, deodorant             Do not wear nail polish.  Do not shave  48 hours prior to surgery.              Men may shave face and neck.   Do not bring valuables to the hospital. [place] IS NOT             RESPONSIBLE   FOR VALUABLES.  Contacts, dentures or bridgework may not be worn into surgery.  Leave suitcase in the car. After surgery it may be brought to your room.               Please read over the following fact sheets you were given: _____________________________________________________________________             [facility] - Preparing for Surgery Before surgery, you can play an important role.  Because skin is not sterile, your skin needs to be as free of germs as possible.  You can reduce the number of germs on your skin by washing with CHG (chlorahexidine gluconate) soap before surgery.  CHG is an antiseptic cleaner which kills germs and bonds with the skin to continue killing germs even after washing. Please DO NOT use if you have an allergy to CHG or antibacterial soaps.  If your skin becomes reddened/irritated stop using the CHG and inform your nurse when you arrive at Short Stay. Do not shave (including legs and underarms) for at least 48 hours prior to the first  CHG shower.  You may shave your face/neck. Please follow these instructions carefully:  1.  Shower with CHG Soap the night before surgery and the  morning of Surgery.  2.  If you choose to wash your hair, wash your hair first as usual with your  normal  shampoo.  3.  After you shampoo, rinse your hair and body thoroughly to remove the  shampoo.                           4.  Use CHG as you would any other liquid soap.  You can apply chg directly  to the skin and wash                       Gently with a scrungie or clean washcloth.  5.  Apply the CHG  Soap to your body ONLY FROM THE NECK DOWN.   Do not use on face/ open                           Wound or open sores. Avoid contact with eyes, ears mouth and genitals (private parts).                       Wash face,  Genitals (private parts) with your normal soap.             6.  Wash thoroughly, paying special attention to the area where your surgery  will be performed.  7.  Thoroughly rinse your body with warm water from the neck down.  8.  DO NOT shower/wash with your normal soap after using and rinsing off  the CHG Soap.                9.  Pat yourself dry with a clean towel.            10.  Wear clean pajamas.            11.  Place clean sheets on your bed the night of your first shower and do not  sleep with pets. Day of Surgery : Do not apply any lotions/deodorants the morning of surgery.  Please wear clean clothes to the hospital/surgery center.  FAILURE TO FOLLOW THESE INSTRUCTIONS MAY RESULT IN THE CANCELLATION OF YOUR SURGERY PATIENT SIGNATURE_________________________________  NURSE SIGNATURE__________________________________  ________________________________________________________________________   Adam Phenix  An incentive spirometer is a tool that can help keep your lungs clear and active. This tool measures how well you are filling your lungs with each breath. Taking long deep breaths may help reverse or decrease the  chance of developing breathing (pulmonary) problems (especially infection) following:  A long period of time when you are unable to move or be active. BEFORE THE PROCEDURE   If the spirometer includes an indicator to show your best effort, your nurse or respiratory therapist will set it to a desired goal.  If possible, sit up straight or lean slightly forward. Try not to slouch.  Hold the incentive spirometer in an upright position. INSTRUCTIONS FOR USE  1. Sit on the edge of your bed if possible, or sit up as far as you can in bed or on a chair. 2. Hold the incentive spirometer in an upright position. 3. Breathe out normally. 4. Place the mouthpiece in your mouth and seal your lips tightly around it. 5. Breathe in slowly and as deeply as possible, raising the piston or the ball toward the top of the column. 6. Hold your breath for 3-5 seconds or for as long as possible. Allow the piston or ball to fall to the bottom of the column. 7. Remove the mouthpiece from your mouth and breathe out normally. 8. Rest for a few seconds and repeat Steps 1 through 7 at least 10 times every 1-2 hours when you are awake. Take your time and take a few normal breaths between deep breaths. 9. The spirometer may include an indicator to show your best effort. Use the indicator as a goal to work toward during each repetition. 10. After each set of 10 deep breaths, practice coughing to be sure your lungs are clear. If you have an incision (the cut made at the time of surgery), support your incision when coughing by placing a pillow or rolled up towels  firmly against it. Once you are able to get out of bed, walk around indoors and cough well. You may stop using the incentive spirometer when instructed by your caregiver.  RISKS AND COMPLICATIONS  Take your time so you do not get dizzy or light-headed.  If you are in pain, you may need to take or ask for pain medication before doing incentive spirometry. It is harder  to take a deep breath if you are having pain. AFTER USE  Rest and breathe slowly and easily.  It can be helpful to keep track of a log of your progress. Your caregiver can provide you with a simple table to help with this. If you are using the spirometer at home, follow these instructions: Jane IF:   You are having difficultly using the spirometer.  You have trouble using the spirometer as often as instructed.  Your pain medication is not giving enough relief while using the spirometer.  You develop fever of 100.5 F (38.1 C) or higher. SEEK IMMEDIATE MEDICAL CARE IF:   You cough up bloody sputum that had not been present before.  You develop fever of 102 F (38.9 C) or greater.  You develop worsening pain at or near the incision site. MAKE SURE YOU:   Understand these instructions.  Will watch your condition.  Will get help right away if you are not doing well or get worse. Document Released: 10/20/2006 Document Revised: 09/01/2011 Document Reviewed: 12/21/2006 Exit[employer] Patient Information 2014 [employer], [place].   ________________________________________________________________________  WHAT IS A BLOOD TRANSFUSION? Blood Transfusion Information  A transfusion is the replacement of blood or some of its parts. Blood is made up of multiple cells which provide different functions.  Red blood cells carry oxygen and are used for blood loss replacement.  White blood cells fight against infection.  Platelets control bleeding.  Plasma helps clot blood.  Other blood products are available for specialized needs, such as hemophilia or other clotting disorders. BEFORE THE TRANSFUSION  Who gives blood for transfusions?   Healthy volunteers who are fully evaluated to make sure their blood is safe. This is blood bank blood. Transfusion therapy is the safest it has ever been in the practice of medicine. Before blood is taken from a donor, a complete history is taken  to make sure that person has no history of diseases nor engages in risky social behavior (examples are intravenous drug use or sexual activity with multiple partners). The donor's travel history is screened to minimize risk of transmitting infections, such as malaria. The donated blood is tested for signs of infectious diseases, such as HIV and hepatitis. The blood is then tested to be sure it is compatible with you in order to minimize the chance of a transfusion reaction. If you or a relative donates blood, this is often done in anticipation of surgery and is not appropriate for emergency situations. It takes many days to process the donated blood. RISKS AND COMPLICATIONS Although transfusion therapy is very safe and saves many lives, the main dangers of transfusion include:   Getting an infectious disease.  Developing a transfusion reaction. This is an allergic reaction to something in the blood you were given. Every precaution is taken to prevent this. The decision to have a blood transfusion has been considered carefully by your caregiver before blood is given. Blood is not given unless the benefits outweigh the risks. AFTER THE TRANSFUSION  Right after receiving a blood transfusion, you will usually feel much better  and more energetic. This is especially true if your red blood cells have gotten low (anemic). The transfusion raises the level of the red blood cells which carry oxygen, and this usually causes an energy increase.  The nurse administering the transfusion will monitor you carefully for complications. HOME CARE INSTRUCTIONS  No special instructions are needed after a transfusion. You may find your energy is better. Speak with your caregiver about any limitations on activity for underlying diseases you may have. SEEK MEDICAL CARE IF:   Your condition is not improving after your transfusion.  You develop redness or irritation at the intravenous (IV) site. SEEK IMMEDIATE MEDICAL CARE  IF:  Any of the following symptoms occur over the next 12 hours:  Shaking chills.  You have a temperature by mouth above 102 F (38.9 C), not controlled by medicine.  Chest, back, or muscle pain.  People around you feel you are not acting correctly or are confused.  Shortness of breath or difficulty breathing.  Dizziness and fainting.  You get a rash or develop hives.  You have a decrease in urine output.  Your urine turns a dark color or changes to pink, red, or brown. Any of the following symptoms occur over the next 10 days:  You have a temperature by mouth above 102 F (38.9 C), not controlled by medicine.  Shortness of breath.  Weakness after normal activity.  The white part of the eye turns yellow (jaundice).  You have a decrease in the amount of urine or are urinating less often.  Your urine turns a dark color or changes to pink, red, or brown. Document Released: 06/06/2000 Document Revised: 09/01/2011 Document Reviewed: 01/24/2008 [facility] Patient Information 2014 [place], [place].  _______________________________________________________________________

## 2016-08-11 NOTE — Progress Notes (Signed)
Medical clearance Baity, NP 03-04-16 in epic LOV 06-06-16 Leone PayorGessner epic EKG 10-10-15 epic

## 2016-08-12 ENCOUNTER — Encounter (HOSPITAL_COMMUNITY)
Admission: RE | Admit: 2016-08-12 | Discharge: 2016-08-12 | Disposition: A | Discharge status: Home / Self Care | Payer: Managed Care, Other (non HMO) | Source: Ambulatory Visit | Attending: Orthopedic Surgery | Admitting: Orthopedic Surgery

## 2016-08-12 ENCOUNTER — Encounter (HOSPITAL_COMMUNITY): Payer: Self-pay

## 2016-08-12 DIAGNOSIS — M1612 Unilateral primary osteoarthritis, left hip: Secondary | ICD-10-CM | POA: Diagnosis not present

## 2016-08-12 DIAGNOSIS — Z01812 Encounter for preprocedural laboratory examination: Secondary | ICD-10-CM | POA: Insufficient documentation

## 2016-08-12 DIAGNOSIS — M25552 Pain in left hip: Secondary | ICD-10-CM | POA: Insufficient documentation

## 2016-08-12 HISTORY — DX: Zoster without complications: B02.9

## 2016-08-12 LAB — COMPREHENSIVE METABOLIC PANEL
ALT: 18 U/L (ref 14–54)
AST: 18 U/L (ref 15–41)
Albumin: 4.4 g/dL (ref 3.5–5.0)
Alkaline Phosphatase: 88 U/L (ref 38–126)
Anion gap: 7 (ref 5–15)
BUN: 12 mg/dL (ref 6–20)
CALCIUM: 9.5 mg/dL (ref 8.9–10.3)
CHLORIDE: 102 mmol/L (ref 101–111)
CO2: 30 mmol/L (ref 22–32)
Creatinine, Ser: 0.61 mg/dL (ref 0.44–1.00)
Glucose, Bld: 92 mg/dL (ref 65–99)
Potassium: 4.8 mmol/L (ref 3.5–5.1)
Sodium: 139 mmol/L (ref 135–145)
Total Bilirubin: 0.5 mg/dL (ref 0.3–1.2)
Total Protein: 7.3 g/dL (ref 6.5–8.1)

## 2016-08-12 LAB — CBC
HEMATOCRIT: 39.4 % (ref 36.0–46.0)
Hemoglobin: 13.2 g/dL (ref 12.0–15.0)
MCH: 30.1 pg (ref 26.0–34.0)
MCHC: 33.5 g/dL (ref 30.0–36.0)
MCV: 90 fL (ref 78.0–100.0)
PLATELETS: 169 10*3/uL (ref 150–400)
RBC: 4.38 MIL/uL (ref 3.87–5.11)
RDW: 14.3 % (ref 11.5–15.5)
WBC: 7.5 10*3/uL (ref 4.0–10.5)

## 2016-08-12 LAB — SURGICAL PCR SCREEN
MRSA, PCR: NEGATIVE
STAPHYLOCOCCUS AUREUS: POSITIVE — AB

## 2016-08-19 ENCOUNTER — Inpatient Hospital Stay (HOSPITAL_COMMUNITY): Payer: Managed Care, Other (non HMO) | Admitting: Anesthesiology

## 2016-08-19 ENCOUNTER — Encounter (HOSPITAL_COMMUNITY): Payer: Self-pay | Admitting: Orthopedic Surgery

## 2016-08-19 ENCOUNTER — Inpatient Hospital Stay (HOSPITAL_COMMUNITY)
Admission: RE | Admit: 2016-08-19 | Discharge: 2016-08-20 | DRG: 470 | Disposition: A | Discharge status: Home / Self Care | Payer: Managed Care, Other (non HMO) | Source: Ambulatory Visit | Attending: Orthopedic Surgery | Admitting: Orthopedic Surgery

## 2016-08-19 ENCOUNTER — Inpatient Hospital Stay (HOSPITAL_COMMUNITY): Payer: Managed Care, Other (non HMO)

## 2016-08-19 ENCOUNTER — Encounter (HOSPITAL_COMMUNITY)
Admission: RE | Disposition: A | Discharge status: Home / Self Care | Payer: Self-pay | Source: Ambulatory Visit | Attending: Orthopedic Surgery

## 2016-08-19 DIAGNOSIS — Z96642 Presence of left artificial hip joint: Secondary | ICD-10-CM

## 2016-08-19 DIAGNOSIS — E663 Overweight: Secondary | ICD-10-CM | POA: Diagnosis present

## 2016-08-19 DIAGNOSIS — Z8619 Personal history of other infectious and parasitic diseases: Secondary | ICD-10-CM

## 2016-08-19 DIAGNOSIS — Z96641 Presence of right artificial hip joint: Secondary | ICD-10-CM

## 2016-08-19 DIAGNOSIS — Z6828 Body mass index (BMI) 28.0-28.9, adult: Secondary | ICD-10-CM | POA: Diagnosis not present

## 2016-08-19 DIAGNOSIS — Z888 Allergy status to other drugs, medicaments and biological substances status: Secondary | ICD-10-CM | POA: Diagnosis not present

## 2016-08-19 DIAGNOSIS — I10 Essential (primary) hypertension: Secondary | ICD-10-CM | POA: Diagnosis present

## 2016-08-19 DIAGNOSIS — K700 Alcoholic fatty liver: Secondary | ICD-10-CM | POA: Diagnosis present

## 2016-08-19 DIAGNOSIS — Z8261 Family history of arthritis: Secondary | ICD-10-CM | POA: Diagnosis not present

## 2016-08-19 DIAGNOSIS — M1612 Unilateral primary osteoarthritis, left hip: Principal | ICD-10-CM | POA: Diagnosis present

## 2016-08-19 DIAGNOSIS — M25552 Pain in left hip: Secondary | ICD-10-CM | POA: Diagnosis present

## 2016-08-19 DIAGNOSIS — F329 Major depressive disorder, single episode, unspecified: Secondary | ICD-10-CM | POA: Diagnosis present

## 2016-08-19 DIAGNOSIS — F1020 Alcohol dependence, uncomplicated: Secondary | ICD-10-CM | POA: Diagnosis present

## 2016-08-19 DIAGNOSIS — E785 Hyperlipidemia, unspecified: Secondary | ICD-10-CM | POA: Diagnosis present

## 2016-08-19 DIAGNOSIS — F419 Anxiety disorder, unspecified: Secondary | ICD-10-CM | POA: Diagnosis present

## 2016-08-19 DIAGNOSIS — Z87891 Personal history of nicotine dependence: Secondary | ICD-10-CM | POA: Diagnosis not present

## 2016-08-19 DIAGNOSIS — Z8249 Family history of ischemic heart disease and other diseases of the circulatory system: Secondary | ICD-10-CM | POA: Diagnosis not present

## 2016-08-19 DIAGNOSIS — Z818 Family history of other mental and behavioral disorders: Secondary | ICD-10-CM

## 2016-08-19 DIAGNOSIS — Z96649 Presence of unspecified artificial hip joint: Secondary | ICD-10-CM

## 2016-08-19 HISTORY — PX: TOTAL HIP ARTHROPLASTY: SHX124

## 2016-08-19 LAB — TYPE AND SCREEN
ABO/RH(D): O POS
Antibody Screen: NEGATIVE

## 2016-08-19 SURGERY — ARTHROPLASTY, HIP, TOTAL, ANTERIOR APPROACH
Anesthesia: Spinal | Site: Hip | Laterality: Left

## 2016-08-19 MED ORDER — MENTHOL 3 MG MT LOZG: 1.0000 | LOZENGE | OROMUCOSAL | Status: DC | PRN

## 2016-08-19 MED ORDER — LACTATED RINGERS IV SOLN: INTRAVENOUS | Status: DC

## 2016-08-19 MED ORDER — METHOCARBAMOL 1000 MG/10ML IJ SOLN
500.0000 mg | Freq: Four times a day (QID) | INTRAVENOUS | Status: DC | PRN
  Administered 2016-08-19: 500 mg via INTRAVENOUS
  Filled 2016-08-19: qty 5
  Filled 2016-08-19: qty 550

## 2016-08-19 MED ORDER — DOCUSATE SODIUM 100 MG PO CAPS: 100.0000 mg | ORAL_CAPSULE | Freq: Two times a day (BID) | ORAL | 0 refills | Status: DC

## 2016-08-19 MED ORDER — VENLAFAXINE HCL ER 150 MG PO CP24
150.0000 mg | ORAL_CAPSULE | Freq: Every day | ORAL | Status: DC
  Administered 2016-08-20: 150 mg via ORAL
  Filled 2016-08-19: qty 1

## 2016-08-19 MED ORDER — ONDANSETRON HCL 4 MG/2ML IJ SOLN: 4.0000 mg | Freq: Four times a day (QID) | INTRAMUSCULAR | Status: DC | PRN

## 2016-08-19 MED ORDER — AMLODIPINE BESYLATE-VALSARTAN 5-320 MG PO TABS: 1.0000 | ORAL_TABLET | Freq: Every day | ORAL | Status: DC

## 2016-08-19 MED ORDER — CEFAZOLIN SODIUM-DEXTROSE 2-4 GM/100ML-% IV SOLN
2.0000 g | Freq: Four times a day (QID) | INTRAVENOUS | Status: AC
  Administered 2016-08-19 (×2): 2 g via INTRAVENOUS
  Filled 2016-08-19 (×2): qty 100

## 2016-08-19 MED ORDER — MIRTAZAPINE 15 MG PO TABS
15.0000 mg | ORAL_TABLET | Freq: Every day | ORAL | Status: DC
  Administered 2016-08-19: 15 mg via ORAL
  Filled 2016-08-19: qty 1

## 2016-08-19 MED ORDER — FENTANYL CITRATE (PF) 100 MCG/2ML IJ SOLN: 25.0000 ug | INTRAMUSCULAR | Status: DC | PRN

## 2016-08-19 MED ORDER — HYDROCODONE-ACETAMINOPHEN 7.5-325 MG PO TABS
1.0000 | ORAL_TABLET | ORAL | Status: DC
  Administered 2016-08-19 (×2): 1 via ORAL
  Administered 2016-08-19 – 2016-08-20 (×6): 2 via ORAL
  Filled 2016-08-19: qty 1
  Filled 2016-08-19: qty 2
  Filled 2016-08-19: qty 1
  Filled 2016-08-19 (×5): qty 2

## 2016-08-19 MED ORDER — BISACODYL 10 MG RE SUPP: 10.0000 mg | Freq: Every day | RECTAL | Status: DC | PRN

## 2016-08-19 MED ORDER — MAGNESIUM CITRATE PO SOLN: 1.0000 | Freq: Once | ORAL | Status: DC | PRN

## 2016-08-19 MED ORDER — HYDROMORPHONE HCL 1 MG/ML IJ SOLN: 0.5000 mg | INTRAMUSCULAR | Status: DC | PRN

## 2016-08-19 MED ORDER — HYDROCODONE-ACETAMINOPHEN 7.5-325 MG PO TABS: 1.0000 | ORAL_TABLET | ORAL | 0 refills | Status: DC | PRN

## 2016-08-19 MED ORDER — LACTATED RINGERS IV SOLN
INTRAVENOUS | Status: DC
  Administered 2016-08-19 (×2): via INTRAVENOUS

## 2016-08-19 MED ORDER — METOCLOPRAMIDE HCL 5 MG/ML IJ SOLN: 10.0000 mg | Freq: Once | INTRAMUSCULAR | Status: DC | PRN

## 2016-08-19 MED ORDER — ONDANSETRON HCL 4 MG/2ML IJ SOLN
INTRAMUSCULAR | Status: AC
  Filled 2016-08-19: qty 2

## 2016-08-19 MED ORDER — MEPERIDINE HCL 50 MG/ML IJ SOLN: 6.2500 mg | INTRAMUSCULAR | Status: DC | PRN

## 2016-08-19 MED ORDER — DEXAMETHASONE SODIUM PHOSPHATE 10 MG/ML IJ SOLN
10.0000 mg | Freq: Once | INTRAMUSCULAR | Status: DC
  Filled 2016-08-19: qty 1

## 2016-08-19 MED ORDER — PROPOFOL 10 MG/ML IV BOLUS
INTRAVENOUS | Status: AC
  Filled 2016-08-19: qty 60

## 2016-08-19 MED ORDER — CEFAZOLIN SODIUM-DEXTROSE 2-4 GM/100ML-% IV SOLN
2.0000 g | INTRAVENOUS | Status: AC
  Administered 2016-08-19: 2 g via INTRAVENOUS
  Filled 2016-08-19: qty 100

## 2016-08-19 MED ORDER — FERROUS SULFATE 325 (65 FE) MG PO TABS
325.0000 mg | ORAL_TABLET | Freq: Three times a day (TID) | ORAL | Status: DC
  Administered 2016-08-20 (×2): 325 mg via ORAL
  Filled 2016-08-19 (×2): qty 1

## 2016-08-19 MED ORDER — EPHEDRINE SULFATE 50 MG/ML IJ SOLN
INTRAMUSCULAR | Status: DC | PRN
  Administered 2016-08-19: 10 mg via INTRAVENOUS
  Administered 2016-08-19: 15 mg via INTRAVENOUS

## 2016-08-19 MED ORDER — METOPROLOL SUCCINATE ER 100 MG PO TB24
100.0000 mg | ORAL_TABLET | Freq: Every day | ORAL | Status: DC
  Administered 2016-08-20: 100 mg via ORAL
  Filled 2016-08-19: qty 1

## 2016-08-19 MED ORDER — ASPIRIN 81 MG PO CHEW: 81.0000 mg | CHEWABLE_TABLET | Freq: Two times a day (BID) | ORAL | 0 refills | Status: AC

## 2016-08-19 MED ORDER — DIPHENHYDRAMINE HCL 25 MG PO CAPS: 25.0000 mg | ORAL_CAPSULE | Freq: Four times a day (QID) | ORAL | Status: DC | PRN

## 2016-08-19 MED ORDER — PHENOL 1.4 % MT LIQD: 1.0000 | OROMUCOSAL | Status: DC | PRN

## 2016-08-19 MED ORDER — MIDAZOLAM HCL 2 MG/2ML IJ SOLN
INTRAMUSCULAR | Status: AC
  Filled 2016-08-19: qty 2

## 2016-08-19 MED ORDER — FERROUS SULFATE 325 (65 FE) MG PO TABS: 325.0000 mg | ORAL_TABLET | Freq: Three times a day (TID) | ORAL | Status: DC

## 2016-08-19 MED ORDER — ATORVASTATIN CALCIUM 20 MG PO TABS
20.0000 mg | ORAL_TABLET | Freq: Every day | ORAL | Status: DC
  Administered 2016-08-19: 20 mg via ORAL
  Filled 2016-08-19: qty 1

## 2016-08-19 MED ORDER — CELECOXIB 200 MG PO CAPS
200.0000 mg | ORAL_CAPSULE | Freq: Two times a day (BID) | ORAL | Status: DC
  Administered 2016-08-19 – 2016-08-20 (×2): 200 mg via ORAL
  Filled 2016-08-19 (×2): qty 1

## 2016-08-19 MED ORDER — METHOCARBAMOL 500 MG PO TABS: 500.0000 mg | ORAL_TABLET | Freq: Four times a day (QID) | ORAL | 0 refills | Status: DC | PRN

## 2016-08-19 MED ORDER — MIDAZOLAM HCL 2 MG/2ML IJ SOLN
INTRAMUSCULAR | Status: DC | PRN
  Administered 2016-08-19: 2 mg via INTRAVENOUS

## 2016-08-19 MED ORDER — SODIUM CHLORIDE 0.9 % IR SOLN
Status: DC | PRN
  Administered 2016-08-19: 1000 mL

## 2016-08-19 MED ORDER — CHLORHEXIDINE GLUCONATE 4 % EX LIQD: 60.0000 mL | Freq: Once | CUTANEOUS | Status: DC

## 2016-08-19 MED ORDER — ALUM & MAG HYDROXIDE-SIMETH 200-200-20 MG/5ML PO SUSP: 30.0000 mL | ORAL | Status: DC | PRN

## 2016-08-19 MED ORDER — METOCLOPRAMIDE HCL 5 MG PO TABS: 5.0000 mg | ORAL_TABLET | Freq: Three times a day (TID) | ORAL | Status: DC | PRN

## 2016-08-19 MED ORDER — IRBESARTAN 150 MG PO TABS
300.0000 mg | ORAL_TABLET | Freq: Every day | ORAL | Status: DC
  Administered 2016-08-20: 300 mg via ORAL
  Filled 2016-08-19: qty 2

## 2016-08-19 MED ORDER — DOCUSATE SODIUM 100 MG PO CAPS
100.0000 mg | ORAL_CAPSULE | Freq: Two times a day (BID) | ORAL | Status: DC
  Administered 2016-08-19 – 2016-08-20 (×2): 100 mg via ORAL
  Filled 2016-08-19 (×2): qty 1

## 2016-08-19 MED ORDER — PROPOFOL 10 MG/ML IV BOLUS
INTRAVENOUS | Status: DC | PRN
  Administered 2016-08-19: 10 mg via INTRAVENOUS
  Administered 2016-08-19: 20 mg via INTRAVENOUS

## 2016-08-19 MED ORDER — AMLODIPINE BESYLATE 5 MG PO TABS
5.0000 mg | ORAL_TABLET | Freq: Every day | ORAL | Status: DC
  Administered 2016-08-20: 5 mg via ORAL
  Filled 2016-08-19: qty 1

## 2016-08-19 MED ORDER — ONDANSETRON HCL 4 MG PO TABS: 4.0000 mg | ORAL_TABLET | Freq: Four times a day (QID) | ORAL | Status: DC | PRN

## 2016-08-19 MED ORDER — BUPIVACAINE IN DEXTROSE 0.75-8.25 % IT SOLN
INTRATHECAL | Status: DC | PRN
  Administered 2016-08-19: 1.6 mL via INTRATHECAL

## 2016-08-19 MED ORDER — POLYETHYLENE GLYCOL 3350 17 G PO PACK
17.0000 g | PACK | Freq: Two times a day (BID) | ORAL | Status: DC
  Administered 2016-08-20: 17 g via ORAL
  Filled 2016-08-19: qty 1

## 2016-08-19 MED ORDER — LIDOCAINE 2% (20 MG/ML) 5 ML SYRINGE
INTRAMUSCULAR | Status: AC
  Filled 2016-08-19: qty 5

## 2016-08-19 MED ORDER — TRANEXAMIC ACID 1000 MG/10ML IV SOLN
1000.0000 mg | INTRAVENOUS | Status: AC
  Administered 2016-08-19: 1000 mg via INTRAVENOUS
  Filled 2016-08-19: qty 1100

## 2016-08-19 MED ORDER — ONDANSETRON HCL 4 MG/2ML IJ SOLN
INTRAMUSCULAR | Status: DC | PRN
  Administered 2016-08-19: 4 mg via INTRAVENOUS

## 2016-08-19 MED ORDER — EPHEDRINE 5 MG/ML INJ
INTRAVENOUS | Status: AC
  Filled 2016-08-19: qty 10

## 2016-08-19 MED ORDER — DEXAMETHASONE SODIUM PHOSPHATE 10 MG/ML IJ SOLN
INTRAMUSCULAR | Status: AC
  Filled 2016-08-19: qty 1

## 2016-08-19 MED ORDER — LIDOCAINE 2% (20 MG/ML) 5 ML SYRINGE
INTRAMUSCULAR | Status: DC | PRN
  Administered 2016-08-19 (×2): 20 mg via INTRAVENOUS

## 2016-08-19 MED ORDER — ASPIRIN 81 MG PO CHEW
81.0000 mg | CHEWABLE_TABLET | Freq: Two times a day (BID) | ORAL | Status: DC
  Administered 2016-08-19 – 2016-08-20 (×2): 81 mg via ORAL
  Filled 2016-08-19 (×2): qty 1

## 2016-08-19 MED ORDER — PROPOFOL 500 MG/50ML IV EMUL
INTRAVENOUS | Status: DC | PRN
  Administered 2016-08-19: 25 ug/kg/min via INTRAVENOUS

## 2016-08-19 MED ORDER — METOCLOPRAMIDE HCL 5 MG/ML IJ SOLN: 5.0000 mg | Freq: Three times a day (TID) | INTRAMUSCULAR | Status: DC | PRN

## 2016-08-19 MED ORDER — METHOCARBAMOL 500 MG PO TABS
500.0000 mg | ORAL_TABLET | Freq: Four times a day (QID) | ORAL | Status: DC | PRN
  Administered 2016-08-19 – 2016-08-20 (×2): 500 mg via ORAL
  Filled 2016-08-19 (×2): qty 1

## 2016-08-19 MED ORDER — POLYETHYLENE GLYCOL 3350 17 G PO PACK: 17.0000 g | PACK | Freq: Two times a day (BID) | ORAL | 0 refills | Status: DC

## 2016-08-19 MED ORDER — SODIUM CHLORIDE 0.9 % IV SOLN
INTRAVENOUS | Status: DC
  Administered 2016-08-19: 21:00:00 via INTRAVENOUS
  Administered 2016-08-19: 100 mL/h via INTRAVENOUS

## 2016-08-19 MED ORDER — DEXAMETHASONE SODIUM PHOSPHATE 10 MG/ML IJ SOLN
INTRAMUSCULAR | Status: DC | PRN
  Administered 2016-08-19: 10 mg via INTRAVENOUS

## 2016-08-19 SURGICAL SUPPLY — 36 items
BAG DECANTER FOR FLEXI CONT (MISCELLANEOUS) IMPLANT
BAG ZIPLOCK 12X15 (MISCELLANEOUS) IMPLANT
BLADE SAG 18X100X1.27 (BLADE) ×2 IMPLANT
CAPT HIP TOTAL 2 ×2 IMPLANT
CLOTH BEACON ORANGE TIMEOUT ST (SAFETY) ×2 IMPLANT
COVER PERINEAL POST (MISCELLANEOUS) ×2 IMPLANT
DERMABOND ADVANCED (GAUZE/BANDAGES/DRESSINGS) ×1
DERMABOND ADVANCED .7 DNX12 (GAUZE/BANDAGES/DRESSINGS) ×1 IMPLANT
DRAPE STERI IOBAN 125X83 (DRAPES) ×2 IMPLANT
DRAPE U-SHAPE 47X51 STRL (DRAPES) ×4 IMPLANT
DRESSING AQUACEL AG SP 3.5X10 (GAUZE/BANDAGES/DRESSINGS) ×1 IMPLANT
DRSG AQUACEL AG SP 3.5X10 (GAUZE/BANDAGES/DRESSINGS) ×2
DURAPREP 26ML APPLICATOR (WOUND CARE) ×2 IMPLANT
ELECT REM PT RETURN 15FT ADLT (MISCELLANEOUS) IMPLANT
ELECT REM PT RETURN 9FT ADLT (ELECTROSURGICAL) ×2
ELECTRODE REM PT RTRN 9FT ADLT (ELECTROSURGICAL) ×1 IMPLANT
GLOVE BIOGEL M STRL SZ7.5 (GLOVE) ×4 IMPLANT
GLOVE BIOGEL PI IND STRL 7.5 (GLOVE) ×2 IMPLANT
GLOVE BIOGEL PI IND STRL 8.5 (GLOVE) IMPLANT
GLOVE BIOGEL PI INDICATOR 7.5 (GLOVE) ×2
GLOVE BIOGEL PI INDICATOR 8.5 (GLOVE)
GLOVE ECLIPSE 8.0 STRL XLNG CF (GLOVE) IMPLANT
GLOVE ORTHO TXT STRL SZ7.5 (GLOVE) ×2 IMPLANT
GOWN STRL REUS W/TWL LRG LVL3 (GOWN DISPOSABLE) ×2 IMPLANT
GOWN STRL REUS W/TWL XL LVL3 (GOWN DISPOSABLE) ×2 IMPLANT
HOLDER FOLEY CATH W/STRAP (MISCELLANEOUS) ×2 IMPLANT
PACK ANTERIOR HIP CUSTOM (KITS) ×2 IMPLANT
SUT MNCRL AB 4-0 PS2 18 (SUTURE) ×2 IMPLANT
SUT VIC AB 1 CT1 36 (SUTURE) ×6 IMPLANT
SUT VIC AB 2-0 CT1 27 (SUTURE) ×2
SUT VIC AB 2-0 CT1 TAPERPNT 27 (SUTURE) ×2 IMPLANT
SUT VLOC 180 0 24IN GS25 (SUTURE) ×2 IMPLANT
TRAY FOLEY CATH 14FRSI W/METER (CATHETERS) ×2 IMPLANT
TRAY FOLEY W/METER SILVER 16FR (SET/KITS/TRAYS/PACK) IMPLANT
WATER STERILE IRR 1500ML POUR (IV SOLUTION) ×2 IMPLANT
YANKAUER SUCT BULB TIP 10FT TU (MISCELLANEOUS) ×2 IMPLANT

## 2016-08-19 NOTE — Evaluation (Signed)
Physical Therapy Evaluation Patient Details Name: Audrey [name] MRN: [id number] DOB: [birth year] Today's Date: 08/19/2016   History of Present Illness  s/p L DA THA; PMHx: R THA  Clinical Impression  Pt is s/p THA resulting in the deficits listed below (see PT Problem List). * Pt will benefit from skilled PT to increase their independence and safety with mobility to allow discharge to the venue listed below. Pt should progress well; will have dtr to assist for 1wk     Follow Up Recommendations Home health PT;Outpatient PT;No PT follow up (per MD)    Equipment Recommendations  None recommended by PT    Recommendations for Other Services       Precautions / Restrictions Precautions Precautions: Fall Restrictions Weight Bearing Restrictions: No Other Position/Activity Restrictions: WBAT      Mobility  Bed Mobility Overal bed mobility: Needs Assistance Bed Mobility: Supine to Sit     Supine to sit: Min assist     General bed mobility comments: assist with LLE  Transfers Overall transfer level: Needs assistance Equipment used: Rolling walker (2 wheeled) Transfers: Sit to/from Stand Sit to Stand: Min guard         General transfer comment: initial cue for hand placement  Ambulation/Gait Ambulation/Gait assistance: Min guard Ambulation Distance (Feet): 60 Feet Assistive device: Rolling walker (2 wheeled) Gait Pattern/deviations: Step-to pattern;Decreased weight shift to left     General Gait Details: cues for sequence  Stairs            Wheelchair Mobility    Modified Rankin (Stroke Patients Only)       Balance                                             Pertinent Vitals/Pain Pain Assessment: 0-10 Pain Score: 2  Pain Location: L hip Pain Descriptors / Indicators: Sore Pain Intervention(s): Limited activity within patient's tolerance;Monitored during session;Premedicated before session;Repositioned    Home Living  Family/patient expects to be discharged to:: Private residence Living Arrangements: Spouse/significant other Available Help at Discharge: Family;Available 24 hours/day (dtr) Type of Home: House Home Access: Stairs to enter Entrance Stairs-Rails: Right Entrance Stairs-Number of Steps: 3 Home Layout: One level Home Equipment: Walker - 2 wheels      Prior Function Level of Independence: Independent               Hand Dominance        Extremity/Trunk Assessment   Upper Extremity Assessment Upper Extremity Assessment: Defer to OT evaluation;Overall [facility]FL for tasks assessed    Lower Extremity Assessment Lower Extremity Assessment: LLE deficits/detail LLE Deficits / Details: knee extension/flexion 3/5, ankle WFL LLE: Unable to fully assess due to pain       Communication   Communication: No difficulties  Cognition Arousal/Alertness: Awake/alert Behavior During Therapy: WFL for tasks assessed/performed Overall Cognitive Status: Within Functional Limits for tasks assessed                      General Comments      Exercises Total Joint Exercises Ankle Circles/Pumps: AROM;Both;10 reps Quad Sets: 5 reps;Both;AROM Heel Slides: AAROM;Left;10 reps   Assessment/Plan    PT Assessment Patient needs continued PT services  PT Problem List Decreased strength;Decreased range of motion;Decreased activity tolerance;Decreased mobility;Decreased knowledge of use of DME;Pain       PT Treatment Interventions DME  instruction;Gait training;Functional mobility training;Stair training;Therapeutic activities;Therapeutic exercise;Patient/family education    PT Goals (Current goals can be found in the Care Plan section)  Acute Rehab PT Goals Patient Stated Goal: walk normal PT Goal Formulation: With patient Time For Goal Achievement: 08/26/16 Potential to Achieve Goals: Good    Frequency 7X/week   Barriers to discharge        Co-evaluation               End of  Session Equipment Utilized During Treatment: Gait belt Activity Tolerance: Patient tolerated treatment well Patient left: in chair;with call bell/phone within reach;with family/visitor present Nurse Communication: Mobility status PT Visit Diagnosis: Difficulty in walking, not elsewhere classified (R26.2)         Time: 1442-1510 PT Time Calculation (min) (ACUTE ONLY): 28 min   Charges:   PT Evaluation $PT Eval Low Complexity: 1 Procedure PT Treatments $Gait Training: 8-22 mins   PT G Codes:         Audrey [name] 2016/09/10, 5:06 PM

## 2016-08-19 NOTE — Transfer of Care (Signed)
Immediate Anesthesia Transfer of Care Note  Patient: Audrey [name]  Procedure(s) Performed: Procedure(s) with comments: LEFT TOTAL HIP ARTHROPLASTY ANTERIOR APPROACH (Left) - requests 70 mins  Patient Location: PACU  Anesthesia Type:MAC and Spinal  Level of Consciousness: awake, alert , oriented and patient cooperative  Airway & Oxygen Therapy: Patient Spontanous Breathing and Patient connected to face mask oxygen  Post-op Assessment: Report given to RN and Post -op Vital signs reviewed and stable  Post vital signs: Reviewed and stable  Last Vitals: There were no vitals filed for this visit.  Last Pain: There were no vitals filed for this visit.       Complications: No apparent anesthesia complications

## 2016-08-19 NOTE — Anesthesia Preprocedure Evaluation (Signed)
Anesthesia Evaluation  Patient identified by MRN, date of birth, ID band Patient awake    Reviewed: Allergy & Precautions, NPO status , Patient's Chart, lab work & pertinent test results, reviewed documented beta blocker date and time   Airway Mallampati: I  TM Distance: >3 FB Neck ROM: Full    Dental  (+) Teeth Intact, Dental Advisory Given   Pulmonary Current Smoker, former smoker,    breath sounds clear to auscultation       Cardiovascular hypertension, Pt. on medications and Pt. on home beta blockers  Rhythm:Regular Rate:Normal     Neuro/Psych PSYCHIATRIC DISORDERS Anxiety Depression    GI/Hepatic negative GI ROS, Neg liver ROS,   Endo/Other  negative endocrine ROS  Renal/GU negative Renal ROS  negative genitourinary   Musculoskeletal  (+) Arthritis ,   Abdominal Normal abdominal exam  (+)   Peds negative pediatric ROS (+)  Hematology negative hematology ROS (+)   Anesthesia Other Findings - HLD  Reproductive/Obstetrics negative OB ROS                             Lab Results  Component Value Date   WBC 7.5 08/12/2016   HGB 13.2 08/12/2016   HCT 39.4 08/12/2016   MCV 90.0 08/12/2016   PLT 169 08/12/2016   Lab Results  Component Value Date   INR 1.03 10/10/2015   09/2015 EKG: normal sinus rhythm.    Anesthesia Physical  Anesthesia Plan  ASA: II  Anesthesia Plan: Spinal   Post-op Pain Management:    Induction: Intravenous  Airway Management Planned: Natural Airway and Simple Face Mask  Additional Equipment:   Intra-op Plan:   Post-operative Plan:   Informed Consent: I have reviewed the patients History and Physical, chart, labs and discussed the procedure including the risks, benefits and alternatives for the proposed anesthesia with the patient or authorized representative who has indicated his/her understanding and acceptance.     Plan Discussed with:  CRNA  Anesthesia Plan Comments:         Anesthesia Quick Evaluation

## 2016-08-19 NOTE — Anesthesia Postprocedure Evaluation (Signed)
Anesthesia Post Note  Patient: Audrey PersonLinda V Blinn  Procedure(s) Performed: Procedure(s) (LRB): LEFT TOTAL HIP ARTHROPLASTY ANTERIOR APPROACH (Left)  Patient location during evaluation: PACU Anesthesia Type: Spinal Level of consciousness: awake and alert Pain management: pain level controlled Vital Signs Assessment: post-procedure vital signs reviewed and stable Respiratory status: spontaneous breathing and respiratory function stable Cardiovascular status: blood pressure returned to baseline and stable Postop Assessment: no headache, no backache and spinal receding Anesthetic complications: no       Last Vitals:  Vitals:   08/19/16 1143 08/19/16 1243  BP: 123/71 129/69  Pulse: (!) 59 66  Resp: 15 16  Temp: 36.6 C 36.7 C    Last Pain:  Vitals:   08/19/16 1221  TempSrc:   PainSc: 4                  Phillips Groutarignan, Yeimi Debnam

## 2016-08-19 NOTE — Discharge Instructions (Signed)

## 2016-08-19 NOTE — Op Note (Signed)
NAME:  Audrey [name].: [id number]      MEDICAL RECORD NO.: [id number]      FACILITY:  [facility]      PHYSICIAN:  Durene Romans D  DATE OF BIRTH:  1950/09/21     DATE OF PROCEDURE:  08/19/2016                                 OPERATIVE REPORT         PREOPERATIVE DIAGNOSIS: Left  hip osteoarthritis.      POSTOPERATIVE DIAGNOSIS:  Left hip osteoarthritis.      PROCEDURE:  Left total hip replacement through an anterior approach   utilizing DePuy THR system, component size 52mm pinnacle cup, a size 36+4 neutral   Altrex liner, a size 3 Hi Tri Lock stem with a 36+1.5 delta ceramic   ball.      SURGEON:  Madlyn Frankel. Charlann Boxer, M.D.      ASSISTANT:  Skip Mayer, PA-C     ANESTHESIA:  Spinal.      SPECIMENS:  None.      COMPLICATIONS:  None.      BLOOD LOSS:  250 cc     DRAINS:  None.      INDICATION OF THE PROCEDURE:  TEMIA DEBROUX is a 66 y.o. female who had   presented to office for evaluation of left hip pain.  Radiographs revealed   progressive degenerative changes with bone-on-bone   articulation to the  hip joint.  The patient had painful limited range of   motion significantly affecting their overall quality of life.  The patient was failing to    respond to conservative measures, and at this point was ready   to proceed with more definitive measures.  The patient has noted progressive   degenerative changes in his hip, progressive problems and dysfunction   with regarding the hip prior to surgery.  Consent was obtained for   benefit of pain relief.  Specific risk of infection, DVT, component   failure, dislocation, need for revision surgery, as well discussion of   the anterior versus posterior approach were reviewed.  Consent was   obtained for benefit of anterior pain relief through an anterior   approach.      PROCEDURE IN DETAIL:  The patient was brought to operative theater.   Once adequate anesthesia,  preoperative antibiotics, 2gm of Ancef, 1 gm of Tranexamic Acid, and 10 mg of Decadron administered.   The patient was positioned supine on the OSI Hanna table.  Once adequate   padding of boney process was carried out, we had predraped out the hip, and  used fluoroscopy to confirm orientation of the pelvis and position.      The left hip was then prepped and draped from proximal iliac crest to   mid thigh with shower curtain technique.      Time-out was performed identifying the patient, planned procedure, and   extremity.     An incision was then made 2 cm distal and lateral to the   anterior superior iliac spine extending over the orientation of the   tensor fascia lata muscle and sharp dissection was carried down to the   fascia of the muscle and protractor placed in the soft tissues.      The fascia was  then incised.  The muscle belly was identified and swept   laterally and retractor placed along the superior neck.  Following   cauterization of the circumflex vessels and removing some pericapsular   fat, a second cobra retractor was placed on the inferior neck.  A third   retractor was placed on the anterior acetabulum after elevating the   anterior rectus.  A L-capsulotomy was along the line of the   superior neck to the trochanteric fossa, then extended proximally and   distally.  Tag sutures were placed and the retractors were then placed   intracapsular.  We then identified the trochanteric fossa and   orientation of my neck cut, confirmed this radiographically   and then made a neck osteotomy with the femur on traction.  The femoral   head was removed without difficulty or complication.  Traction was let   off and retractors were placed posterior and anterior around the   acetabulum.      The labrum and foveal tissue were debrided.  I began reaming with a 48mm   reamer and reamed up to 51mm reamer with good bony bed preparation and a 52mm   cup was chosen.  The final 52mm  Pinnacle cup was then impacted under fluoroscopy  to confirm the depth of penetration and orientation with respect to   abduction.  A screw was placed followed by the hole eliminator.  The final   36+4 neutral Altrex liner was impacted with good visualized rim fit.  The cup was positioned anatomically within the acetabular portion of the pelvis.      At this point, the femur was rolled at 80 degrees.  Further capsule was   released off the inferior aspect of the femoral neck.  I then   released the superior capsule proximally.  The hook was placed laterally   along the femur and elevated manually and held in position with the bed   hook.  The leg was then extended and adducted with the leg rolled to 100   degrees of external rotation.  Once the proximal femur was fully   exposed, I used a box osteotome to set orientation.  I then began   broaching with the starting chili pepper broach and passed this by hand and then broached up to 3.  With the 3 broach in place I chose a high offset neck and did several trial reductions.  The offset was appropriate, leg lengths   appeared to be equal best matched with the +1.5 confirmed radiographically.   Given these findings, I went ahead and dislocated the hip, repositioned all   retractors and positioned the right hip in the extended and abducted position.  The final 3 Hi Tri Lock stem was   chosen and it was impacted down to the level of neck cut.  Based on this   and the trial reduction, a 36+1.5 delta ceramic ball was chosen and   impacted onto a clean and dry trunnion, and the hip was reduced.  The   hip had been irrigated throughout the case again at this point.  I did   reapproximate the superior capsular leaflet to the anterior leaflet   using #1 Vicryl.  The fascia of the   tensor fascia lata muscle was then reapproximated using #1 Vicryl.  The   remaining wound was closed with 2-0 Vicryl and running 4-0 Monocryl.   The hip was cleaned, dried,  and dressed sterilely using Dermabond and  Aquacel dressing.  She was then brought   to recovery room in stable condition tolerating the procedure well.    Skip MayerBlair Roberts, PA-C was present for the entirety of the case involved from   preoperative positioning, perioperative retractor management, general   facilitation of the case, as well as primary wound closure as assistant.            Madlyn FrankelMatthew D. Charlann Boxerlin, M.D.        08/19/2016 9:54 AM

## 2016-08-19 NOTE — Anesthesia Procedure Notes (Signed)
Procedure Name: MAC Date/Time: 08/19/2016 8:45 AM Performed by: Dione Booze Pre-anesthesia Checklist: Patient identified, Emergency Drugs available, Suction available and Patient being monitored Patient Re-evaluated:Patient Re-evaluated prior to inductionOxygen Delivery Method: Simple face mask Placement Confirmation: positive ETCO2

## 2016-08-19 NOTE — Anesthesia Procedure Notes (Signed)
Spinal  Patient location during procedure: OR Staffing Anesthesiologist: Laney Louderback Performed: anesthesiologist  Preanesthetic Checklist Completed: patient identified, site marked, surgical consent, pre-op evaluation, timeout performed, IV checked, risks and benefits discussed and monitors and equipment checked Spinal Block Patient position: sitting Prep: Betadine Patient monitoring: heart rate, continuous pulse ox and blood pressure Approach: right paramedian Location: L3-4 Injection technique: single-shot Needle Needle type: Spinocan  Needle gauge: 22 G Needle length: 9 cm Additional Notes Expiration date of kit checked and confirmed. Patient tolerated procedure well, without complications.       

## 2016-08-19 NOTE — Interval H&P Note (Signed)
History and Physical Interval Note:  08/19/2016 7:00 AM  Audrey PersonLinda V [name]  has presented today for surgery, with the diagnosis of Left hip osteoarthritis  The various methods of treatment have been discussed with the patient and family. After consideration of risks, benefits and other options for treatment, the patient has consented to  Procedure(s) with comments: LEFT TOTAL HIP ARTHROPLASTY ANTERIOR APPROACH (Left) - requests 70 mins as a surgical intervention .  The patient's history has been reviewed, patient examined, no change in status, stable for surgery.  I have reviewed the patient's chart and labs.  Questions were answered to the patient's satisfaction.     Shelda PalLIN,Deosha Werden D

## 2016-08-20 LAB — BASIC METABOLIC PANEL
ANION GAP: 5 (ref 5–15)
BUN: 11 mg/dL (ref 6–20)
CO2: 26 mmol/L (ref 22–32)
CREATININE: 0.48 mg/dL (ref 0.44–1.00)
Calcium: 8.5 mg/dL — ABNORMAL LOW (ref 8.9–10.3)
Chloride: 105 mmol/L (ref 101–111)
Glucose, Bld: 155 mg/dL — ABNORMAL HIGH (ref 65–99)
Potassium: 3.9 mmol/L (ref 3.5–5.1)
Sodium: 136 mmol/L (ref 135–145)

## 2016-08-20 LAB — CBC
HCT: 30.8 % — ABNORMAL LOW (ref 36.0–46.0)
Hemoglobin: 10.4 g/dL — ABNORMAL LOW (ref 12.0–15.0)
MCH: 30.2 pg (ref 26.0–34.0)
MCHC: 33.8 g/dL (ref 30.0–36.0)
MCV: 89.5 fL (ref 78.0–100.0)
PLATELETS: 163 10*3/uL (ref 150–400)
RBC: 3.44 MIL/uL — ABNORMAL LOW (ref 3.87–5.11)
RDW: 13.7 % (ref 11.5–15.5)
WBC: 12.2 10*3/uL — AB (ref 4.0–10.5)

## 2016-08-20 NOTE — Progress Notes (Signed)
Physical Therapy Treatment Patient Details Name: Audrey [name] MRN: [id number] DOB: 1950-09-25 Today's Date: 08/20/2016    History of Present Illness s/p L DA THA; PMHx: R THA    PT Comments    Progressing very well; all questions answered; thorough review of HEP as pt is going home without HHPT--per MD   Follow Up Recommendations  No PT follow up (per MD)     Equipment Recommendations       Recommendations for Other Services       Precautions / Restrictions Precautions Precautions: Fall Restrictions Weight Bearing Restrictions: No Other Position/Activity Restrictions: WBAT    Mobility  Bed Mobility Overal bed mobility: Needs Assistance Bed Mobility: Supine to Sit     Supine to sit: Min assist     General bed mobility comments: assist with LLE  Transfers Overall transfer level: Needs assistance Equipment used: Rolling walker (2 wheeled) Transfers: Sit to/from Stand Sit to Stand: Supervision;Modified independent (Device/Increase time)         General transfer comment: initial cue for hand placement  Ambulation/Gait Ambulation/Gait assistance: Min guard;Supervision Ambulation Distance (Feet): 120 Feet Assistive device: Rolling walker (2 wheeled) Gait Pattern/deviations: Step-to pattern;Decreased weight shift to left     General Gait Details: cues for sequence, tends to internally rotate bil hips/forefoot inversion   Stairs Stairs: Yes   Stair Management: No rails;Backwards;With walker;Step to pattern Number of Stairs: 2 General stair comments: cues for sequence  Wheelchair Mobility    Modified Rankin (Stroke Patients Only)       Balance                                    Cognition Arousal/Alertness: Awake/alert Behavior During Therapy: WFL for tasks assessed/performed Overall Cognitive Status: Within Functional Limits for tasks assessed                      Exercises Total Joint Exercises Ankle  Circles/Pumps: AROM;Both;10 reps Quad Sets: AROM;Strengthening;Both;10 reps Heel Slides: AAROM;Left;10 reps Hip ABduction/ADduction: AROM;Strengthening;Left;10 reps    General Comments        Pertinent Vitals/Pain Pain Assessment: 0-10 Pain Score: 4  Pain Location: L hip Pain Descriptors / Indicators: Sore Pain Intervention(s): Limited activity within patient's tolerance;Monitored during session    Home Living                      Prior Function            PT Goals (current goals can now be found in the care plan section) Acute Rehab PT Goals Patient Stated Goal: walk normal PT Goal Formulation: With patient Time For Goal Achievement: 08/26/16 Potential to Achieve Goals: Good    Frequency    7X/week      PT Plan Current plan remains appropriate    Co-evaluation             End of Session Equipment Utilized During Treatment: Gait belt Activity Tolerance: Patient tolerated treatment well Patient left: in chair;with call bell/phone within reach;with family/visitor present Nurse Communication: Mobility status PT Visit Diagnosis: Difficulty in walking, not elsewhere classified (R26.2)     Time: 4098-1191 PT Time Calculation (min) (ACUTE ONLY): 36 min  Charges:  $Gait Training: 8-22 mins $Therapeutic Exercise: 8-22 mins                    G Codes:  [facility]ILLIAMS,Cairo Lingenfelter 08/20/2016, 11:57 AM

## 2016-08-20 NOTE — Progress Notes (Signed)
OT Cancellation Note  Patient Details Name: Audrey PersonLinda V [name] MRN: [id number] DOB: March 23, 1951   Cancelled Treatment:    Reason Eval/Treat Not Completed: OT screened, no needs identified, will sign off  Kierre Hintz 08/20/2016, 8:20 AM. Marica OtterMaryellen [name], OTR/L [phone] 08/20/2016

## 2016-08-20 NOTE — Progress Notes (Signed)
RN reviewed discharge instructions with patient and family. All question answered.   Paperwork and prescriptions given.   NT rolled patient down with all belongings to family car.  

## 2016-08-20 NOTE — Progress Notes (Signed)
     Subjective: 1 Day Post-Op Procedure(s) (LRB): LEFT TOTAL HIP ARTHROPLASTY ANTERIOR APPROACH (Left)   Patient reports pain as mild, pain controlled.  No events throughout the night.  Very happy with how the hip feels post-operatively.  Ready to be discharged home.  Objective:   VITALS:   Vitals:   08/20/16 0504 08/20/16 0612  BP:    Pulse: 62 60  Resp: 16 16  Temp:      Dorsiflexion/Plantar flexion intact Incision: dressing C/D/I No cellulitis present Compartment soft  LABS  Recent Labs  08/20/16 0429  HGB 10.4*  HCT 30.8*  WBC 12.2*  PLT 163     Recent Labs  08/20/16 0429  NA 136  K 3.9  BUN 11  CREATININE 0.48  GLUCOSE 155*     Assessment/Plan: 1 Day Post-Op Procedure(s) (LRB): LEFT TOTAL HIP ARTHROPLASTY ANTERIOR APPROACH (Left) Foley cath d/c'ed Advance diet Up with therapy D/C IV fluids Discharge home [facility]. Follow up with OLIN,Kalel Harty D in 2 weeks.  Contact information:  [facility] [address] [place] [id number] [phone]    Overweight (BMI 25-29.9) Estimated body mass index is 28.67 kg/m as calculated from the following:   Height as of this encounter: 5\' 4"  (1.626 m).   Weight as of this encounter: 75.8 kg (167 lb). Patient also counseled that weight may inhibit the healing process Patient counseled that losing weight will help with future health issues         Anastasio AuerbachMatthew S. Akeira Lahm   PAC  08/20/2016, 8:08 AM

## 2016-08-24 NOTE — Discharge Summary (Signed)
Physician Discharge Summary  Patient ID: Audrey [name] MRN: [id number] DOB/AGE: 1951-04-28 66 y.o.  Admit date: 08/19/2016 Discharge date: 08/20/2016   Procedures:  Procedure(s) (LRB): LEFT TOTAL HIP ARTHROPLASTY ANTERIOR APPROACH (Left)  Attending Physician:  Dr. Durene Romans   Admission Diagnoses:   Left hip primary OA / pain  Discharge Diagnoses:  Principal Problem:   S/P left THA, AA Active Problems:   Overweight (BMI 25.0-29.9)  Past Medical History:  Diagnosis Date  . Anxiety   . Arthritis   . Chicken pox   . Depression   . Dizziness    when bending over   . Elevated liver enzymes   . Fatty liver   . Hyperlipidemia   . Hypertension   . Shingles   . Wears glasses     HPI:    Audrey [name], 66 y.o. female, has a history of pain and functional disability in the left hip(s) due to arthritis and patient has failed non-surgical conservative treatments for greater than 12 weeks to include NSAID's and/or analgesics, use of assistive devices and activity modification.  Onset of symptoms was abrupt starting 5 months ago with rapidlly worsening course since that time.The patient noted prior procedures of the hip to include arthroplasty on the right hip in April 2017.  Patient currently rates pain in the left hip at 8 out of 10 with activity. Patient has night pain, worsening of pain with activity and weight bearing, trendelenberg gait, pain that interfers with activities of daily living and pain with passive range of motion. Patient has evidence of periarticular osteophytes and joint space narrowing by imaging studies. This condition presents safety issues increasing the risk of falls. There is no current active infection.   Risks, benefits and expectations were discussed with the patient.  Risks including but not limited to the risk of anesthesia, blood clots, nerve damage, blood vessel damage, failure of the prosthesis, infection and up to and including death.  Patient  understand the risks, benefits and expectations and wishes to proceed with surgery.   PCP: Nicki Reaper, NP   Discharged Condition: good  Hospital Course:  Patient underwent the above stated procedure on 08/19/2016. Patient tolerated the procedure well and brought to the recovery room in good condition and subsequently to the floor.  POD #1 Pulse: 60 ; Resp: 16 Patient reports pain as mild, pain controlled.  No events throughout the night.  Very happy with how the hip feels post-operatively.  Ready to be discharged home. Dorsiflexion/plantar flexion intact, incision: dressing C/D/I, no cellulitis present and compartment soft.   LABS  Basename    HGB     10.4  HCT     30.8     Discharge Exam: General appearance: alert, cooperative and no distress Extremities: Homans sign is negative, no sign of DVT, no edema, redness or tenderness in the calves or thighs and no ulcers, gangrene or trophic changes  Disposition: Home with follow up in 2 weeks   Follow-up Information    Shelda Pal, MD. Schedule an appointment as soon as possible for a visit in 2 week(s).   Specialty:  Orthopedic Surgery Contact information: [address] [place] [address] [phone]           Discharge Instructions    Call MD / Call 911    Complete by:  As directed    If you experience chest pain or shortness of breath, CALL 911 and be transported to the hospital emergency room.  If you develope a fever above 101 F, pus (white drainage) or increased drainage or redness at the wound, or calf pain, call your surgeon's office.   Change dressing    Complete by:  As directed    Maintain surgical dressing until follow up in the clinic. If the edges start to pull up, may reinforce with tape. If the dressing is no longer working, may remove and cover with gauze and tape, but must keep the area dry and clean.  Call with any questions or concerns.   Constipation Prevention    Complete by:   As directed    Drink plenty of fluids.  Prune juice may be helpful.  You may use a stool softener, such as Colace (over the counter) 100 mg twice a day.  Use MiraLax (over the counter) for constipation as needed.   Diet - low sodium heart healthy    Complete by:  As directed    Discharge instructions    Complete by:  As directed    Maintain surgical dressing until follow up in the clinic. If the edges start to pull up, may reinforce with tape. If the dressing is no longer working, may remove and cover with gauze and tape, but must keep the area dry and clean.  Follow up in 2 weeks at [facility]. Call with any questions or concerns.   Increase activity slowly as tolerated    Complete by:  As directed    Weight bearing as tolerated with assist device (walker, cane, etc) as directed, use it as long as suggested by your surgeon or therapist, typically at least 4-6 weeks.   TED hose    Complete by:  As directed    Use stockings (TED hose) for 2 weeks on both leg(s).  You may remove them at night for sleeping.      Allergies as of 08/20/2016      Reactions   Zoloft [sertraline Hcl] Diarrhea   Lisinopril Rash   Wellbutrin [bupropion] Rash      Medication List    STOP taking these medications   ADVIL 200 MG tablet Generic drug:  ibuprofen   predniSONE 20 MG tablet Commonly known as:  DELTASONE     TAKE these medications   amLODipine-valsartan 5-320 MG tablet Commonly known as:  EXFORGE Take 1 tablet by mouth daily. MUST SCHEDULE ANNUAL PHYSICA   aspirin 81 MG chewable tablet Chew 1 tablet (81 mg total) by mouth 2 (two) times daily. Take for 4 weeks.   atorvastatin 20 MG tablet Commonly known as:  LIPITOR Take 1 tablet (20 mg total) by mouth daily at 6 PM. What changed:  when to take this   desvenlafaxine 100 MG 24 hr tablet Commonly known as:  PRISTIQ Take 1 tablet (100 mg total) by mouth daily.   docusate sodium 100 MG capsule Commonly known as:  COLACE Take  1 capsule (100 mg total) by mouth 2 (two) times daily.   ferrous sulfate 325 (65 FE) MG tablet Commonly known as:  FERROUSUL Take 1 tablet (325 mg total) by mouth 3 (three) times daily with meals.   HYDROcodone-acetaminophen 7.5-325 MG tablet Commonly known as:  NORCO Take 1-2 tablets by mouth every 4 (four) hours as needed for moderate pain or severe pain.   methocarbamol 500 MG tablet Commonly known as:  ROBAXIN Take 1 tablet (500 mg total) by mouth every 6 (six) hours as needed for muscle spasms.   metoprolol succinate 100 MG 24 hr  tablet Commonly known as:  TOPROL-XL Take 1 tablet (100 mg total) by mouth daily.   mirtazapine 15 MG tablet Commonly known as:  REMERON Take 1 tablet (15 mg total) by mouth at bedtime.   polyethylene glycol packet Commonly known as:  MIRALAX / GLYCOLAX Take 17 g by mouth 2 (two) times daily.        Signed: Anastasio AuerbachMatthew S. Shelby Peltz   PA-C  08/24/2016, 1:55 PM

## 2016-08-29 ENCOUNTER — Other Ambulatory Visit: Payer: Self-pay

## 2016-08-29 MED ORDER — MIRTAZAPINE 15 MG PO TABS: 15.0000 mg | ORAL_TABLET | Freq: Every day | ORAL | 0 refills | Status: DC

## 2016-08-29 NOTE — Telephone Encounter (Signed)
Sent to PCP ?

## 2016-08-29 NOTE — Telephone Encounter (Signed)
Baity pt---last filled 05/28/16--please advise

## 2016-10-24 ENCOUNTER — Other Ambulatory Visit: Payer: Self-pay

## 2016-10-24 MED ORDER — DESVENLAFAXINE SUCCINATE ER 100 MG PO TB24: 100.0000 mg | ORAL_TABLET | Freq: Every day | ORAL | 0 refills | Status: DC

## 2016-10-24 NOTE — Telephone Encounter (Signed)
Last filled 03/2016 with 1 refill-- please advise if okay to refill

## 2016-11-20 ENCOUNTER — Other Ambulatory Visit: Payer: Self-pay | Admitting: Primary Care

## 2016-11-20 NOTE — Telephone Encounter (Signed)
Ok to refill? Electronically refill request for mirtazapine (REMERON) 15 MG tablet. Last prescribed by Jae DireKate on 08/29/2016. Audrey [name]'s patient. Last seen with [name] on 03/04/2016.

## 2016-11-21 MED ORDER — MIRTAZAPINE 15 MG PO TABS: 15.0000 mg | ORAL_TABLET | Freq: Every day | ORAL | 0 refills | Status: DC

## 2016-11-21 NOTE — Addendum Note (Signed)
Addended by: Roena MaladyEVONTENNO, Nicco Reaume Y on: 11/21/2016 01:30 PM   Modules accepted: Orders

## 2016-12-13 IMAGING — MG MM DIGITAL SCREENING BILAT W/ CAD
4 series · 4 of 4 positions shown · non-contrast
Comparison: None.

CLINICAL DATA: Screening.

EXAM:
DIGITAL SCREENING BILATERAL MAMMOGRAM WITH CAD

[L MLO]
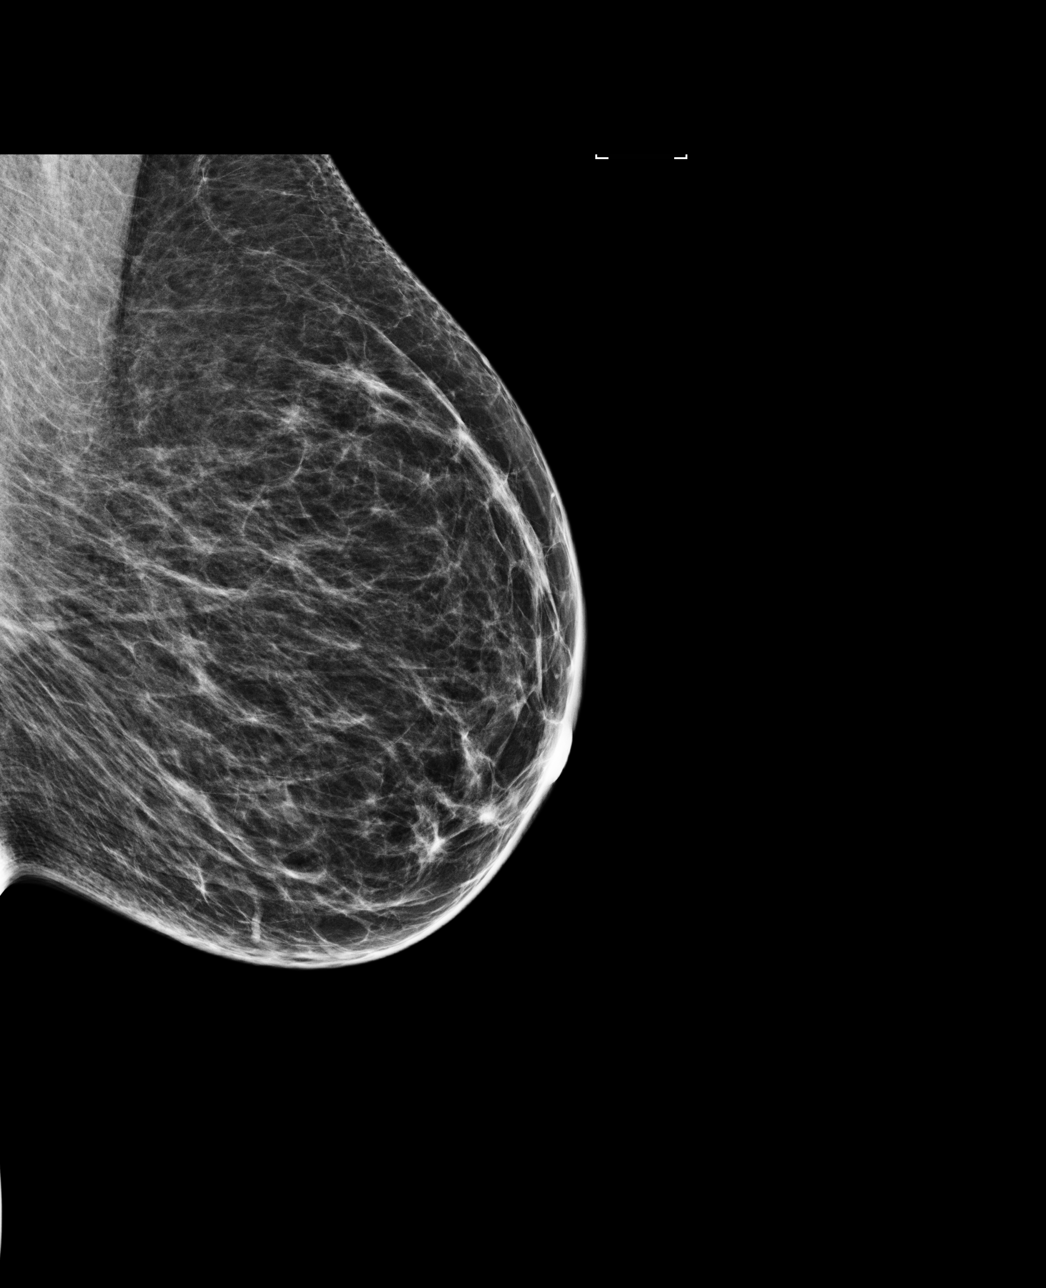

[L CC]
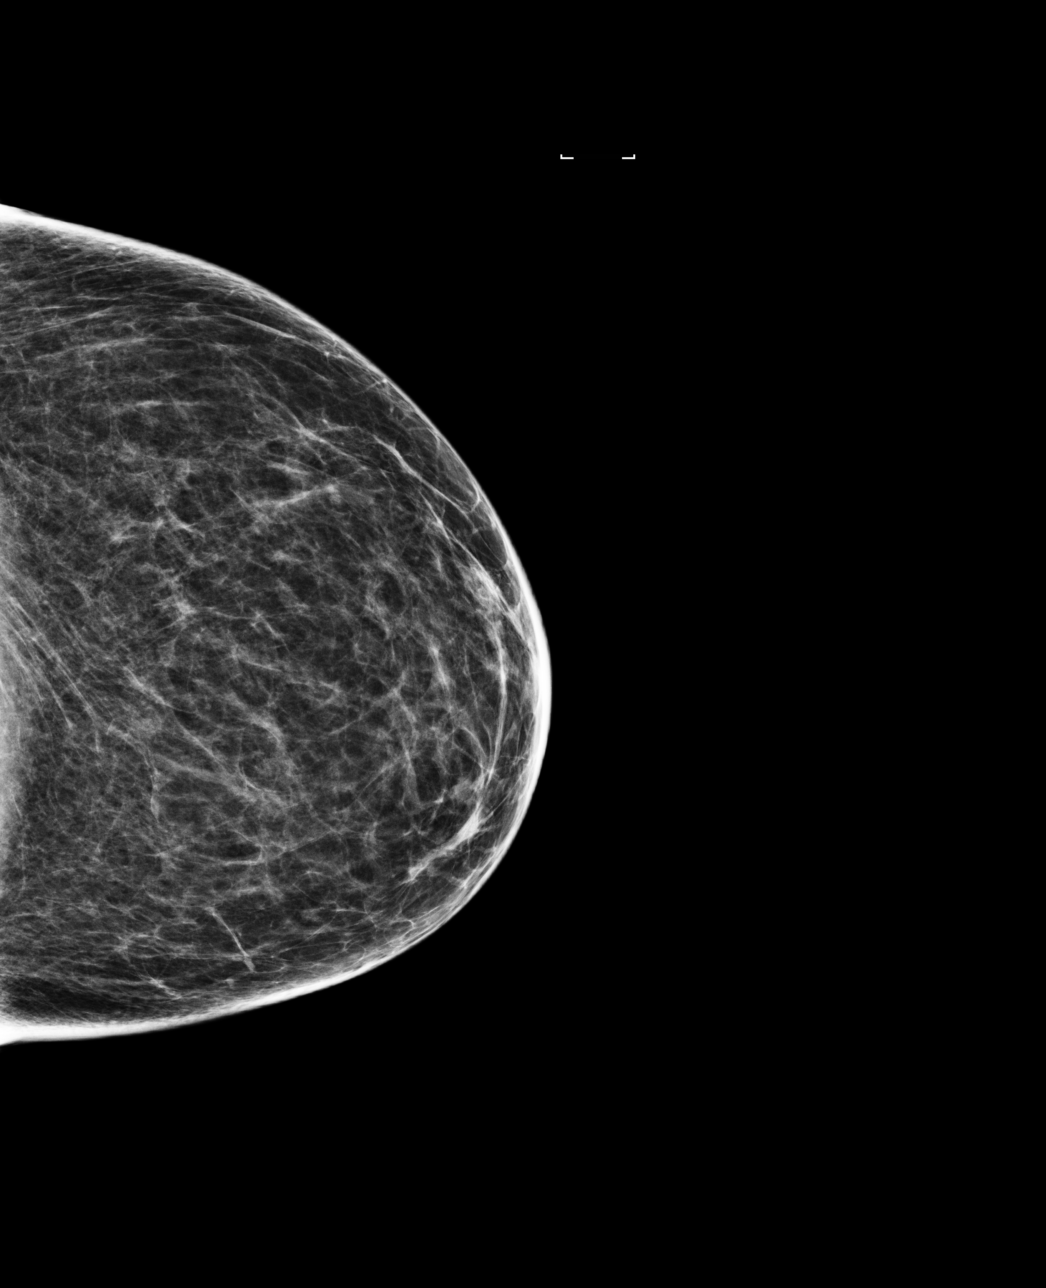

[R MLO]
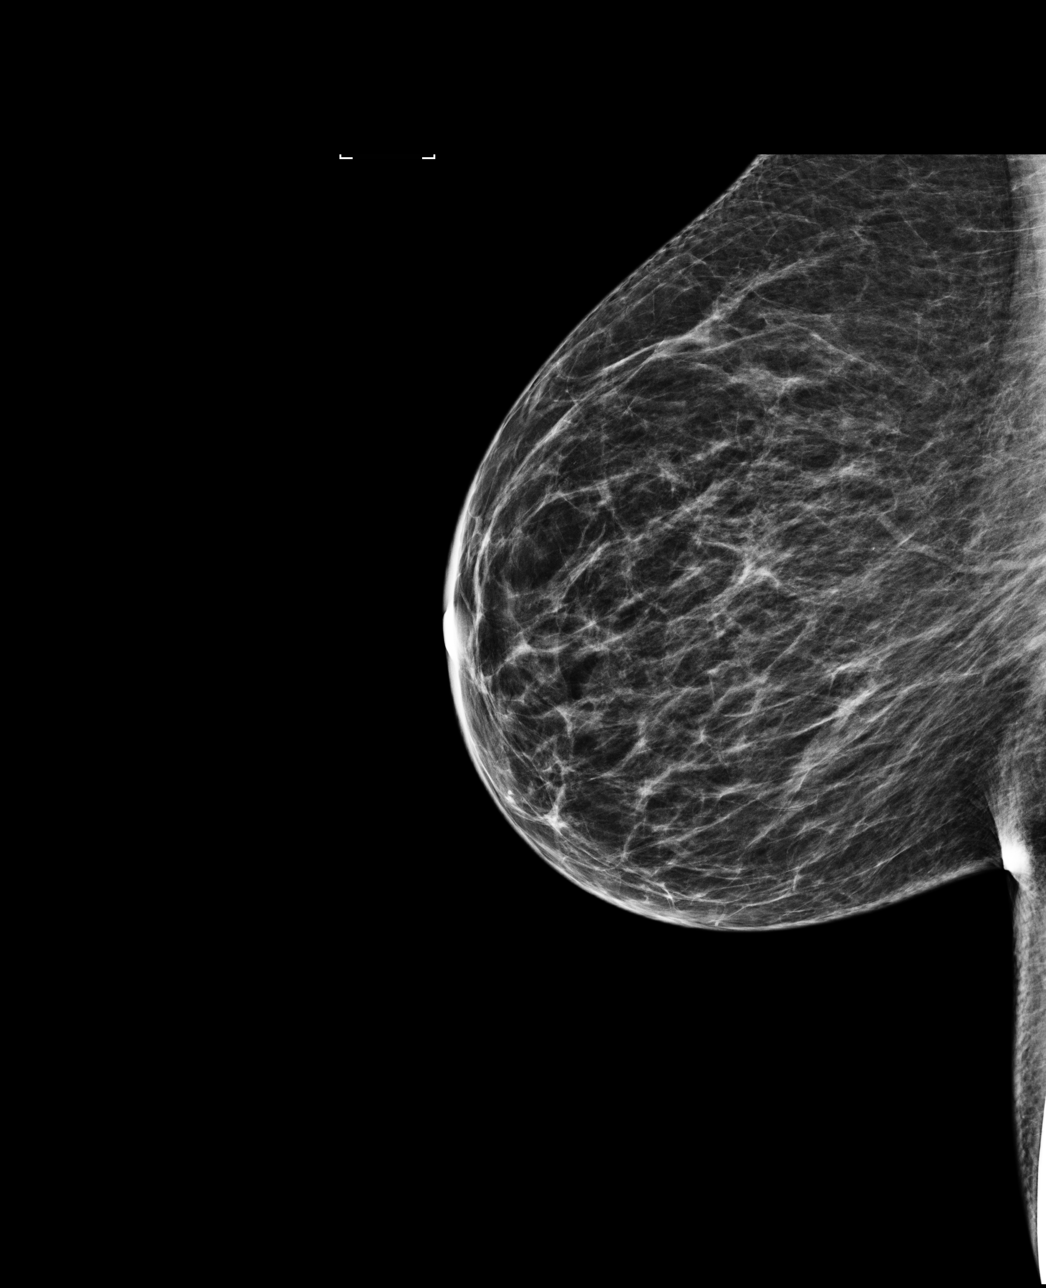

[R CC]
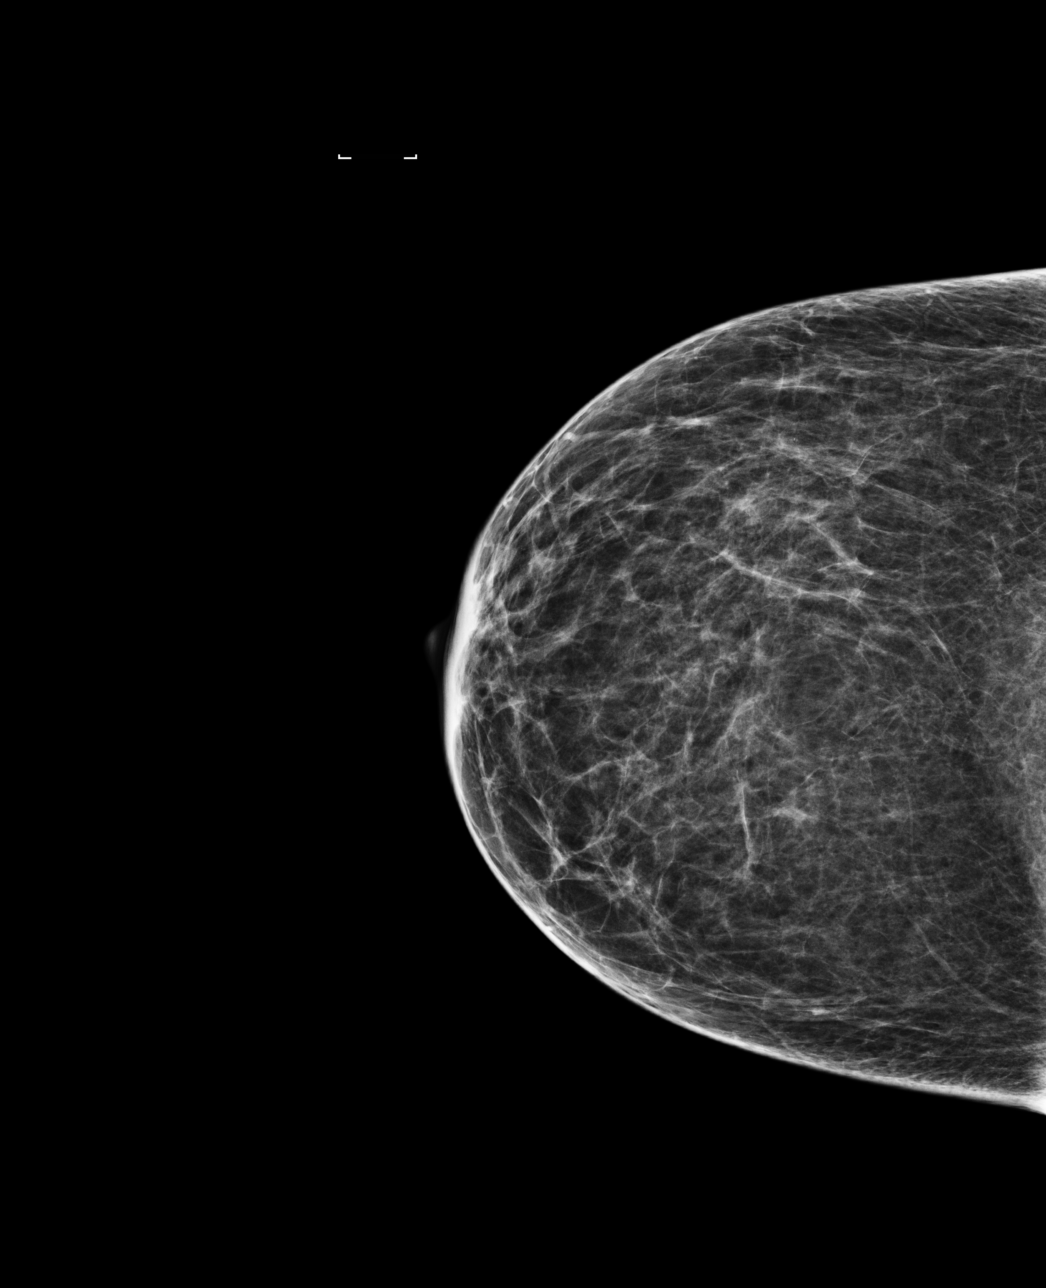

[4 of 4 positions shown; findings below may reference images not displayed]

ACR Breast Density Category b: There are scattered areas of
fibroglandular density.
FINDINGS: There are no findings suspicious for malignancy. Images were
processed with CAD.
IMPRESSION: No mammographic evidence of malignancy. A result letter of this
screening mammogram will be mailed directly to the patient.

RECOMMENDATION:
Screening mammogram in one year. (Code:[id number])

BI-RADS CATEGORY  1: Negative.

## 2017-01-02 ENCOUNTER — Other Ambulatory Visit: Payer: Self-pay

## 2017-01-02 MED ORDER — DESVENLAFAXINE SUCCINATE ER 100 MG PO TB24: 100.0000 mg | ORAL_TABLET | Freq: Every day | ORAL | 0 refills | Status: DC

## 2017-01-02 MED ORDER — MIRTAZAPINE 15 MG PO TABS: 15.0000 mg | ORAL_TABLET | Freq: Every day | ORAL | 0 refills | Status: DC

## 2017-03-05 ENCOUNTER — Encounter: Payer: Managed Care, Other (non HMO) | Admitting: Internal Medicine

## 2017-03-10 ENCOUNTER — Encounter: Payer: Self-pay | Admitting: Internal Medicine

## 2017-03-10 ENCOUNTER — Ambulatory Visit (INDEPENDENT_AMBULATORY_CARE_PROVIDER_SITE_OTHER): Payer: BLUE CROSS/BLUE SHIELD | Admitting: Internal Medicine

## 2017-03-10 VITALS — BP 136/88 | HR 74 | Temp 98.0°F | Ht 64.0 in | Wt 166.0 lb

## 2017-03-10 DIAGNOSIS — Z0000 Encounter for general adult medical examination without abnormal findings: Secondary | ICD-10-CM

## 2017-03-10 DIAGNOSIS — K760 Fatty (change of) liver, not elsewhere classified: Secondary | ICD-10-CM

## 2017-03-10 DIAGNOSIS — I10 Essential (primary) hypertension: Secondary | ICD-10-CM | POA: Diagnosis not present

## 2017-03-10 DIAGNOSIS — Z114 Encounter for screening for human immunodeficiency virus [HIV]: Secondary | ICD-10-CM | POA: Diagnosis not present

## 2017-03-10 DIAGNOSIS — F329 Major depressive disorder, single episode, unspecified: Secondary | ICD-10-CM

## 2017-03-10 DIAGNOSIS — E7800 Pure hypercholesterolemia, unspecified: Secondary | ICD-10-CM

## 2017-03-10 DIAGNOSIS — K700 Alcoholic fatty liver: Secondary | ICD-10-CM

## 2017-03-10 DIAGNOSIS — F419 Anxiety disorder, unspecified: Secondary | ICD-10-CM | POA: Diagnosis not present

## 2017-03-10 DIAGNOSIS — Z23 Encounter for immunization: Secondary | ICD-10-CM | POA: Diagnosis not present

## 2017-03-10 DIAGNOSIS — F32A Depression, unspecified: Secondary | ICD-10-CM

## 2017-03-10 DIAGNOSIS — Z1159 Encounter for screening for other viral diseases: Secondary | ICD-10-CM | POA: Diagnosis not present

## 2017-03-10 LAB — COMPREHENSIVE METABOLIC PANEL
ALT: 15 U/L (ref 0–35)
AST: 15 U/L (ref 0–37)
Albumin: 4.5 g/dL (ref 3.5–5.2)
Alkaline Phosphatase: 77 U/L (ref 39–117)
BUN: 13 mg/dL (ref 6–23)
CHLORIDE: 99 meq/L (ref 96–112)
CO2: 29 mEq/L (ref 19–32)
Calcium: 10.1 mg/dL (ref 8.4–10.5)
Creatinine, Ser: 0.67 mg/dL (ref 0.40–1.20)
GFR: 93.76 mL/min (ref >60.00)
GLUCOSE: 100 mg/dL — AB (ref 70–99)
Potassium: 4.7 mEq/L (ref 3.5–5.1)
Sodium: 135 mEq/L (ref 135–145)
Total Bilirubin: 0.6 mg/dL (ref 0.2–1.2)
Total Protein: 6.9 g/dL (ref 6.0–8.3)

## 2017-03-10 LAB — LIPID PANEL
CHOLESTEROL: 163 mg/dL (ref 0–200)
HDL: 82.2 mg/dL (ref >39.00)
LDL CALC: 68 mg/dL (ref 0–99)
NONHDL: 80.71
Total CHOL/HDL Ratio: 2
Triglycerides: 63 mg/dL (ref 0.0–149.0)
VLDL: 12.6 mg/dL (ref 0.0–40.0)

## 2017-03-10 LAB — CBC
HEMATOCRIT: 42.1 % (ref 36.0–46.0)
Hemoglobin: 14.2 g/dL (ref 12.0–15.0)
MCHC: 33.8 g/dL (ref 30.0–36.0)
MCV: 95.9 fl (ref 78.0–100.0)
Platelets: 172 10*3/uL (ref 150.0–400.0)
RBC: 4.39 Mil/uL (ref 3.87–5.11)
RDW: 15.2 % (ref 11.5–15.5)
WBC: 7.8 10*3/uL (ref 4.0–10.5)

## 2017-03-10 LAB — VITAMIN D 25 HYDROXY (VIT D DEFICIENCY, FRACTURES): VITD: 49.23 ng/mL (ref 30.00–100.00)

## 2017-03-10 MED ORDER — METOPROLOL SUCCINATE ER 100 MG PO TB24: 100.0000 mg | ORAL_TABLET | Freq: Every day | ORAL | 3 refills | Status: DC

## 2017-03-10 MED ORDER — DESVENLAFAXINE SUCCINATE ER 100 MG PO TB24: 100.0000 mg | ORAL_TABLET | Freq: Every day | ORAL | 3 refills | Status: DC

## 2017-03-10 MED ORDER — AMLODIPINE BESYLATE-VALSARTAN 5-320 MG PO TABS: 1.0000 | ORAL_TABLET | Freq: Every day | ORAL | 3 refills | Status: DC

## 2017-03-10 MED ORDER — ATORVASTATIN CALCIUM 20 MG PO TABS: 20.0000 mg | ORAL_TABLET | Freq: Every day | ORAL | 3 refills | Status: DC

## 2017-03-10 NOTE — Assessment & Plan Note (Addendum)
Controlled on Amlodipine-Valsartan and Metoprolol, refilled today CMET today Will monitor

## 2017-03-10 NOTE — Assessment & Plan Note (Addendum)
CMET and lipid profile today Encouraged her to consume a low fat diet . Continue Lipitor for now, refilled today

## 2017-03-10 NOTE — Assessment & Plan Note (Addendum)
Chronic but stable on Pristiq, refilled today Will monitor
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
## 2017-03-10 NOTE — Patient Instructions (Signed)
Health Maintenance for Postmenopausal Women Menopause is a normal process in which your reproductive ability comes to an end. This process happens gradually over a span of months to years, usually between the ages of 79 and 31. Menopause is complete when you have missed 12 consecutive menstrual periods. It is important to talk with your health care provider about some of the most common conditions that affect postmenopausal women, such as heart disease, cancer, and bone loss (osteoporosis). Adopting a healthy lifestyle and getting preventive care can help to promote your health and wellness. Those actions can also lower your chances of developing some of these common conditions. What should I know about menopause? During menopause, you may experience a number of symptoms, such as:  Moderate-to-severe hot flashes.  Night sweats.  Decrease in sex drive.  Mood swings.  Headaches.  Tiredness.  Irritability.  Memory problems.  Insomnia.  Choosing to treat or not to treat menopausal changes is an individual decision that you make with your health care provider. What should I know about hormone replacement therapy and supplements? Hormone therapy products are effective for treating symptoms that are associated with menopause, such as hot flashes and night sweats. Hormone replacement carries certain risks, especially as you become older. If you are thinking about using estrogen or estrogen with progestin treatments, discuss the benefits and risks with your health care provider. What should I know about heart disease and stroke? Heart disease, heart attack, and stroke become more likely as you age. This may be due, in part, to the hormonal changes that your body experiences during menopause. These can affect how your body processes dietary fats, triglycerides, and cholesterol. Heart attack and stroke are both medical emergencies. There are many things that you can do to help prevent heart disease  and stroke:  Have your blood pressure checked at least every 1-2 years. High blood pressure causes heart disease and increases the risk of stroke.  If you are 73-1 years old, ask your health care provider if you should take aspirin to prevent a heart attack or a stroke.  Do not use any tobacco products, including cigarettes, chewing tobacco, or electronic cigarettes. If you need help quitting, ask your health care provider.  It is important to eat a healthy diet and maintain a healthy weight. ? Be sure to include plenty of vegetables, fruits, low-fat dairy products, and lean protein. ? Avoid eating foods that are high in solid fats, added sugars, or salt (sodium).  Get regular exercise. This is one of the most important things that you can do for your health. ? Try to exercise for at least 150 minutes each week. The type of exercise that you do should increase your heart rate and make you sweat. This is known as moderate-intensity exercise. ? Try to do strengthening exercises at least twice each week. Do these in addition to the moderate-intensity exercise.  Know your numbers.Ask your health care provider to check your cholesterol and your blood glucose. Continue to have your blood tested as directed by your health care provider.  What should I know about cancer screening? There are several types of cancer. Take the following steps to reduce your risk and to catch any cancer development as early as possible. Breast Cancer  Practice breast self-awareness. ? This means understanding how your breasts normally appear and feel. ? It also means doing regular breast self-exams. Let your health care provider know about any changes, no matter how small.  If you are 40  or older, have a clinician do a breast exam (clinical breast exam or CBE) every year. Depending on your age, family history, and medical history, it may be recommended that you also have a yearly breast X-ray (mammogram).  If you  have a family history of breast cancer, talk with your health care provider about genetic screening.  If you are at high risk for breast cancer, talk with your health care provider about having an MRI and a mammogram every year.  Breast cancer (BRCA) gene test is recommended for women who have family members with BRCA-related cancers. Results of the assessment will determine the need for genetic counseling and BRCA1 and for BRCA2 testing. BRCA-related cancers include these types: ? Breast. This occurs in males or females. ? Ovarian. ? Tubal. This may also be called fallopian tube cancer. ? Cancer of the abdominal or pelvic lining (peritoneal cancer). ? Prostate. ? Pancreatic.  Cervical, Uterine, and Ovarian Cancer Your health care provider may recommend that you be screened regularly for cancer of the pelvic organs. These include your ovaries, uterus, and vagina. This screening involves a pelvic exam, which includes checking for microscopic changes to the surface of your cervix (Pap test).  For women ages 21-65, health care providers may recommend a pelvic exam and a Pap test every three years. For women ages 79-65, they may recommend the Pap test and pelvic exam, combined with testing for human papilloma virus (HPV), every five years. Some types of HPV increase your risk of cervical cancer. Testing for HPV may also be done on women of any age who have unclear Pap test results.  Other health care providers may not recommend any screening for nonpregnant women who are considered low risk for pelvic cancer and have no symptoms. Ask your health care provider if a screening pelvic exam is right for you.  If you have had past treatment for cervical cancer or a condition that could lead to cancer, you need Pap tests and screening for cancer for at least 20 years after your treatment. If Pap tests have been discontinued for you, your risk factors (such as having a new sexual partner) need to be  reassessed to determine if you should start having screenings again. Some women have medical problems that increase the chance of getting cervical cancer. In these cases, your health care provider may recommend that you have screening and Pap tests more often.  If you have a family history of uterine cancer or ovarian cancer, talk with your health care provider about genetic screening.  If you have vaginal bleeding after reaching menopause, tell your health care provider.  There are currently no reliable tests available to screen for ovarian cancer.  Lung Cancer Lung cancer screening is recommended for adults 69-62 years old who are at high risk for lung cancer because of a history of smoking. A yearly low-dose CT scan of the lungs is recommended if you:  Currently smoke.  Have a history of at least 30 pack-years of smoking and you currently smoke or have quit within the past 15 years. A pack-year is smoking an average of one pack of cigarettes per day for one year.  Yearly screening should:  Continue until it has been 15 years since you quit.  Stop if you develop a health problem that would prevent you from having lung cancer treatment.  Colorectal Cancer  This type of cancer can be detected and can often be prevented.  Routine colorectal cancer screening usually begins at  age 35 and continues through age 32.  If you have risk factors for colon cancer, your health care provider may recommend that you be screened at an earlier age.  If you have a family history of colorectal cancer, talk with your health care provider about genetic screening.  Your health care provider may also recommend using home test kits to check for hidden blood in your stool.  A small camera at the end of a tube can be used to examine your colon directly (sigmoidoscopy or colonoscopy). This is done to check for the earliest forms of colorectal cancer.  Direct examination of the colon should be repeated every  5-10 years until age 47. However, if early forms of precancerous polyps or small growths are found or if you have a family history or genetic risk for colorectal cancer, you may need to be screened more often.  Skin Cancer  Check your skin from head to toe regularly.  Monitor any moles. Be sure to tell your health care provider: ? About any new moles or changes in moles, especially if there is a change in a mole's shape or color. ? If you have a mole that is larger than the size of a pencil eraser.  If any of your family members has a history of skin cancer, especially at a young age, talk with your health care provider about genetic screening.  Always use sunscreen. Apply sunscreen liberally and repeatedly throughout the day.  Whenever you are outside, protect yourself by wearing long sleeves, pants, a wide-brimmed hat, and sunglasses.  What should I know about osteoporosis? Osteoporosis is a condition in which bone destruction happens more quickly than new bone creation. After menopause, you may be at an increased risk for osteoporosis. To help prevent osteoporosis or the bone fractures that can happen because of osteoporosis, the following is recommended:  If you are 71-28 years old, get at least 1,000 mg of calcium and at least 600 mg of vitamin D per day.  If you are older than age 75 but younger than age 40, get at least 1,200 mg of calcium and at least 600 mg of vitamin D per day.  If you are older than age 61, get at least 1,200 mg of calcium and at least 800 mg of vitamin D per day.  Smoking and excessive alcohol intake increase the risk of osteoporosis. Eat foods that are rich in calcium and vitamin D, and do weight-bearing exercises several times each week as directed by your health care provider. What should I know about how menopause affects my mental health? Depression may occur at any age, but it is more common as you become older. Common symptoms of depression  include:  Low or sad mood.  Changes in sleep patterns.  Changes in appetite or eating patterns.  Feeling an overall lack of motivation or enjoyment of activities that you previously enjoyed.  Frequent crying spells.  Talk with your health care provider if you think that you are experiencing depression. What should I know about immunizations? It is important that you get and maintain your immunizations. These include:  Tetanus, diphtheria, and pertussis (Tdap) booster vaccine.  Influenza every year before the flu season begins.  Pneumonia vaccine.  Shingles vaccine.  Your health care provider may also recommend other immunizations. This information is not intended to replace advice given to you by your health care provider. Make sure you discuss any questions you have with your health care provider. Document Released: 08/01/2005  Document Revised: 12/28/2015 Document Reviewed: 03/13/2015 Elsevier Interactive Patient Education  [employer].

## 2017-03-10 NOTE — Assessment & Plan Note (Signed)
CMET today 

## 2017-03-10 NOTE — Addendum Note (Signed)
Addended by: Roena Malady on: 03/10/2017 11:20 AM   Modules accepted: Orders

## 2017-03-11 LAB — HEPATITIS C ANTIBODY
HEP C AB: NONREACTIVE
SIGNAL TO CUT-OFF: 0.01 (ref <1.00)

## 2017-03-11 LAB — HIV ANTIBODY (ROUTINE TESTING W REFLEX): HIV: NONREACTIVE

## 2017-03-20 ENCOUNTER — Other Ambulatory Visit: Payer: Self-pay

## 2017-03-20 MED ORDER — MIRTAZAPINE 15 MG PO TABS: 15.0000 mg | ORAL_TABLET | Freq: Every day | ORAL | 0 refills | Status: DC

## 2017-03-20 NOTE — Telephone Encounter (Signed)
Pt last seen 03/10/17 for annual and advised R Baity NP that pt was not taking mirtazapine; pt has decided that she needs to take the mirtazapine and request refill to [facility] [address] Pt has 10 pills left. Last refilled # 90 on 01/02/17.

## 2017-06-10 ENCOUNTER — Other Ambulatory Visit: Payer: Self-pay | Admitting: Internal Medicine

## 2017-06-11 NOTE — Telephone Encounter (Signed)
Last filled 03/20/17 #90... Please advise CPE visit 02/2017

## 2017-06-12 NOTE — Telephone Encounter (Signed)
Medication sent electronically 

## 2017-09-06 ENCOUNTER — Other Ambulatory Visit: Payer: Self-pay | Admitting: Internal Medicine

## 2017-09-07 NOTE — Telephone Encounter (Signed)
Last filled 05/2017... In 03/10/2017 OV note it states that she is no longer taking Remeron... Please advise if okay to refill

## 2017-09-08 NOTE — Telephone Encounter (Signed)
Can you call her and see if she has restarted this or is wanting to restart this?

## 2017-09-09 NOTE — Telephone Encounter (Signed)
Pt states she did request for the refill and would like to restart medication... Please advise

## 2017-12-04 ENCOUNTER — Other Ambulatory Visit: Payer: Self-pay | Admitting: Internal Medicine

## 2017-12-04 NOTE — Telephone Encounter (Signed)
Last filled 09/09/2017 #90...last OV 02/2017 for CPE... Please advise

## 2018-03-01 ENCOUNTER — Other Ambulatory Visit: Payer: Self-pay | Admitting: Internal Medicine

## 2018-03-01 DIAGNOSIS — Z1231 Encounter for screening mammogram for malignant neoplasm of breast: Secondary | ICD-10-CM

## 2018-03-02 ENCOUNTER — Other Ambulatory Visit: Payer: Self-pay | Admitting: Internal Medicine

## 2018-03-17 ENCOUNTER — Ambulatory Visit
Admission: RE | Admit: 2018-03-17 | Discharge: 2018-03-17 | Disposition: A | Discharge status: Home / Self Care | Payer: BLUE CROSS/BLUE SHIELD | Source: Ambulatory Visit | Attending: Internal Medicine | Admitting: Internal Medicine

## 2018-03-17 DIAGNOSIS — Z1231 Encounter for screening mammogram for malignant neoplasm of breast: Secondary | ICD-10-CM | POA: Insufficient documentation

## 2018-03-19 ENCOUNTER — Encounter: Payer: Self-pay | Admitting: Internal Medicine

## 2018-03-19 ENCOUNTER — Ambulatory Visit (INDEPENDENT_AMBULATORY_CARE_PROVIDER_SITE_OTHER): Payer: BLUE CROSS/BLUE SHIELD | Admitting: Internal Medicine

## 2018-03-19 VITALS — BP 128/78 | HR 64 | Temp 98.4°F | Ht 64.0 in | Wt 164.0 lb

## 2018-03-19 DIAGNOSIS — Z0000 Encounter for general adult medical examination without abnormal findings: Secondary | ICD-10-CM | POA: Diagnosis not present

## 2018-03-19 DIAGNOSIS — E7800 Pure hypercholesterolemia, unspecified: Secondary | ICD-10-CM

## 2018-03-19 DIAGNOSIS — F32A Depression, unspecified: Secondary | ICD-10-CM

## 2018-03-19 DIAGNOSIS — Z23 Encounter for immunization: Secondary | ICD-10-CM

## 2018-03-19 DIAGNOSIS — F419 Anxiety disorder, unspecified: Secondary | ICD-10-CM

## 2018-03-19 DIAGNOSIS — I10 Essential (primary) hypertension: Secondary | ICD-10-CM

## 2018-03-19 DIAGNOSIS — F329 Major depressive disorder, single episode, unspecified: Secondary | ICD-10-CM

## 2018-03-19 NOTE — Assessment & Plan Note (Signed)
Controlled on Amlodipine-Valsartan and Metoprolol Reinforced DASH diet and exercise for weigh loss CBC and CMET today

## 2018-03-19 NOTE — Patient Instructions (Signed)
Health Maintenance for Postmenopausal Women Menopause is a normal process in which your reproductive ability comes to an end. This process happens gradually over a span of months to years, usually between the ages of 22 and 9. Menopause is complete when you have missed 12 consecutive menstrual periods. It is important to talk with your health care provider about some of the most common conditions that affect postmenopausal women, such as heart disease, cancer, and bone loss (osteoporosis). Adopting a healthy lifestyle and getting preventive care can help to promote your health and wellness. Those actions can also lower your chances of developing some of these common conditions. What should I know about menopause? During menopause, you may experience a number of symptoms, such as:  Moderate-to-severe hot flashes.  Night sweats.  Decrease in sex drive.  Mood swings.  Headaches.  Tiredness.  Irritability.  Memory problems.  Insomnia.  Choosing to treat or not to treat menopausal changes is an individual decision that you make with your health care provider. What should I know about hormone replacement therapy and supplements? Hormone therapy products are effective for treating symptoms that are associated with menopause, such as hot flashes and night sweats. Hormone replacement carries certain risks, especially as you become older. If you are thinking about using estrogen or estrogen with progestin treatments, discuss the benefits and risks with your health care provider. What should I know about heart disease and stroke? Heart disease, heart attack, and stroke become more likely as you age. This may be due, in part, to the hormonal changes that your body experiences during menopause. These can affect how your body processes dietary fats, triglycerides, and cholesterol. Heart attack and stroke are both medical emergencies. There are many things that you can do to help prevent heart disease  and stroke:  Have your blood pressure checked at least every 1-2 years. High blood pressure causes heart disease and increases the risk of stroke.  If you are 53-22 years old, ask your health care provider if you should take aspirin to prevent a heart attack or a stroke.  Do not use any tobacco products, including cigarettes, chewing tobacco, or electronic cigarettes. If you need help quitting, ask your health care provider.  It is important to eat a healthy diet and maintain a healthy weight. ? Be sure to include plenty of vegetables, fruits, low-fat dairy products, and lean protein. ? Avoid eating foods that are high in solid fats, added sugars, or salt (sodium).  Get regular exercise. This is one of the most important things that you can do for your health. ? Try to exercise for at least 150 minutes each week. The type of exercise that you do should increase your heart rate and make you sweat. This is known as moderate-intensity exercise. ? Try to do strengthening exercises at least twice each week. Do these in addition to the moderate-intensity exercise.  Know your numbers.Ask your health care provider to check your cholesterol and your blood glucose. Continue to have your blood tested as directed by your health care provider.  What should I know about cancer screening? There are several types of cancer. Take the following steps to reduce your risk and to catch any cancer development as early as possible. Breast Cancer  Practice breast self-awareness. ? This means understanding how your breasts normally appear and feel. ? It also means doing regular breast self-exams. Let your health care provider know about any changes, no matter how small.  If you are 40  or older, have a clinician do a breast exam (clinical breast exam or CBE) every year. Depending on your age, family history, and medical history, it may be recommended that you also have a yearly breast X-ray (mammogram).  If you  have a family history of breast cancer, talk with your health care provider about genetic screening.  If you are at high risk for breast cancer, talk with your health care provider about having an MRI and a mammogram every year.  Breast cancer (BRCA) gene test is recommended for women who have family members with BRCA-related cancers. Results of the assessment will determine the need for genetic counseling and BRCA1 and for BRCA2 testing. BRCA-related cancers include these types: ? Breast. This occurs in males or females. ? Ovarian. ? Tubal. This may also be called fallopian tube cancer. ? Cancer of the abdominal or pelvic lining (peritoneal cancer). ? Prostate. ? Pancreatic.  Cervical, Uterine, and Ovarian Cancer Your health care provider may recommend that you be screened regularly for cancer of the pelvic organs. These include your ovaries, uterus, and vagina. This screening involves a pelvic exam, which includes checking for microscopic changes to the surface of your cervix (Pap test).  For women ages 21-65, health care providers may recommend a pelvic exam and a Pap test every three years. For women ages 79-65, they may recommend the Pap test and pelvic exam, combined with testing for human papilloma virus (HPV), every five years. Some types of HPV increase your risk of cervical cancer. Testing for HPV may also be done on women of any age who have unclear Pap test results.  Other health care providers may not recommend any screening for nonpregnant women who are considered low risk for pelvic cancer and have no symptoms. Ask your health care provider if a screening pelvic exam is right for you.  If you have had past treatment for cervical cancer or a condition that could lead to cancer, you need Pap tests and screening for cancer for at least 20 years after your treatment. If Pap tests have been discontinued for you, your risk factors (such as having a new sexual partner) need to be  reassessed to determine if you should start having screenings again. Some women have medical problems that increase the chance of getting cervical cancer. In these cases, your health care provider may recommend that you have screening and Pap tests more often.  If you have a family history of uterine cancer or ovarian cancer, talk with your health care provider about genetic screening.  If you have vaginal bleeding after reaching menopause, tell your health care provider.  There are currently no reliable tests available to screen for ovarian cancer.  Lung Cancer Lung cancer screening is recommended for adults 69-62 years old who are at high risk for lung cancer because of a history of smoking. A yearly low-dose CT scan of the lungs is recommended if you:  Currently smoke.  Have a history of at least 30 pack-years of smoking and you currently smoke or have quit within the past 15 years. A pack-year is smoking an average of one pack of cigarettes per day for one year.  Yearly screening should:  Continue until it has been 15 years since you quit.  Stop if you develop a health problem that would prevent you from having lung cancer treatment.  Colorectal Cancer  This type of cancer can be detected and can often be prevented.  Routine colorectal cancer screening usually begins at  age 42 and continues through age 45.  If you have risk factors for colon cancer, your health care provider may recommend that you be screened at an earlier age.  If you have a family history of colorectal cancer, talk with your health care provider about genetic screening.  Your health care provider may also recommend using home test kits to check for hidden blood in your stool.  A small camera at the end of a tube can be used to examine your colon directly (sigmoidoscopy or colonoscopy). This is done to check for the earliest forms of colorectal cancer.  Direct examination of the colon should be repeated every  5-10 years until age 71. However, if early forms of precancerous polyps or small growths are found or if you have a family history or genetic risk for colorectal cancer, you may need to be screened more often.  Skin Cancer  Check your skin from head to toe regularly.  Monitor any moles. Be sure to tell your health care provider: ? About any new moles or changes in moles, especially if there is a change in a mole's shape or color. ? If you have a mole that is larger than the size of a pencil eraser.  If any of your family members has a history of skin cancer, especially at a young age, talk with your health care provider about genetic screening.  Always use sunscreen. Apply sunscreen liberally and repeatedly throughout the day.  Whenever you are outside, protect yourself by wearing long sleeves, pants, a wide-brimmed hat, and sunglasses.  What should I know about osteoporosis? Osteoporosis is a condition in which bone destruction happens more quickly than new bone creation. After menopause, you may be at an increased risk for osteoporosis. To help prevent osteoporosis or the bone fractures that can happen because of osteoporosis, the following is recommended:  If you are 46-71 years old, get at least 1,000 mg of calcium and at least 600 mg of vitamin D per day.  If you are older than age 55 but younger than age 65, get at least 1,200 mg of calcium and at least 600 mg of vitamin D per day.  If you are older than age 54, get at least 1,200 mg of calcium and at least 800 mg of vitamin D per day.  Smoking and excessive alcohol intake increase the risk of osteoporosis. Eat foods that are rich in calcium and vitamin D, and do weight-bearing exercises several times each week as directed by your health care provider. What should I know about how menopause affects my mental health? Depression may occur at any age, but it is more common as you become older. Common symptoms of depression  include:  Low or sad mood.  Changes in sleep patterns.  Changes in appetite or eating patterns.  Feeling an overall lack of motivation or enjoyment of activities that you previously enjoyed.  Frequent crying spells.  Talk with your health care provider if you think that you are experiencing depression. What should I know about immunizations? It is important that you get and maintain your immunizations. These include:  Tetanus, diphtheria, and pertussis (Tdap) booster vaccine.  Influenza every year before the flu season begins.  Pneumonia vaccine.  Shingles vaccine.  Your health care provider may also recommend other immunizations. This information is not intended to replace advice given to you by your health care provider. Make sure you discuss any questions you have with your health care provider. Document Released: 08/01/2005  Document Revised: 12/28/2015 Document Reviewed: 03/13/2015 Elsevier Interactive Patient Education  2018 [employer] Inc[employer]  

## 2018-03-19 NOTE — Assessment & Plan Note (Signed)
CMET and Lipid profile today Encouraged her to consume a low fat diet Continue Atorvastatin for now 

## 2018-03-19 NOTE — Progress Notes (Signed)
Subjective:    Patient ID: Audrey [name], female    DOB: April 07, 1951, 67 y.o.   MRN: [id number]  HPI  Pt presents to the clinic today for her annual exam. She is also due to follow up chronic conditions.  Anxiety and Depression: Chronic but stable on Pristiq and Remeron. She does not follow with psychiatry. She denies SI/HI.  HTN: Her BP today is 128/78. She is taking Amlodipine-Valsartan and Metoprolol as prescribed. ECG from 09/2015 reviewed. , HLD: Her last LDL was 68, 02/2017 . She denies myalgias on Atorvastatin. She tries to consume a low fat diet.  Flu: 04/2017 Tetanus: 07/2013 Pneumovax: Prevnar: 02/2017 Zostavax: 12/2013 Shingrix: never Pap Smear: 11/2014 Mammogram: 02/2018 Bone Density: 11/2014 Colon Screening: 02/2015 Vision Screening: every 1-2 years Dentist: biannually  Diet: She does eat meat. She consumes fruits and veggies daily. She tries to avoid fried foods. She drinks mostly water. Exercise: Walking 5 miles daily.  Review of Systems      Past Medical History:  Diagnosis Date  . Anxiety   . Arthritis   . Chicken pox   . Depression   . Dizziness    when bending over   . Elevated liver enzymes   . Fatty liver   . Hyperlipidemia   . Hypertension   . Shingles   . Wears glasses     Current Outpatient Medications  Medication Sig Dispense Refill  . amLODipine-valsartan (EXFORGE) 5-320 MG tablet Take 1 tablet by mouth daily. 90 tablet 3  . atorvastatin (LIPITOR) 20 MG tablet Take 1 tablet (20 mg total) by mouth at bedtime. 90 tablet 3  . desvenlafaxine (PRISTIQ) 100 MG 24 hr tablet Take 1 tablet (100 mg total) by mouth daily. 90 tablet 3  . metoprolol succinate (TOPROL-XL) 100 MG 24 hr tablet Take 1 tablet (100 mg total) by mouth daily. 90 tablet 3  . mirtazapine (REMERON) 15 MG tablet TAKE 1 TABLET(15 MG) BY MOUTH AT BEDTIME 90 tablet 0   No current facility-administered medications for this visit.     Allergies  Allergen Reactions  . Zoloft  [Sertraline Hcl] Diarrhea  . Lisinopril Rash  . Wellbutrin [Bupropion] Rash    Family History  Problem Relation Age of Onset  . Multiple sclerosis Mother   . Mental illness Mother   . Arthritis Father   . Mental illness Maternal Aunt   . Colon cancer Maternal Aunt 70  . Heart disease Maternal Grandmother   . Arthritis Paternal Grandmother   . Hyperlipidemia Paternal Grandmother   . Hypertension Paternal Grandmother   . Heart disease Paternal Grandfather   . Hypertension Paternal Grandfather   . Mental illness Paternal Grandfather   . Stomach cancer Neg Hx   . Rectal cancer Neg Hx   . Esophageal cancer Neg Hx     Social History   Socioeconomic History  . Marital status: Married    Spouse name: Not on file  . Number of children: Not on file  . Years of education: Not on file  . Highest education level: Not on file  Occupational History  . Not on file  Social Needs  . Financial resource strain: Not on file  . Food insecurity:    Worry: Not on file    Inability: Not on file  . Transportation needs:    Medical: Not on file    Non-medical: Not on file  Tobacco Use  . Smoking status: Current Every Day Smoker    Packs/day: 1.00  Years: 50.00    Pack years: 50.00    Types: Cigarettes    Last attempt to quit: 09/23/2015    Years since quitting: 2.4  . Smokeless tobacco: Never Used  Substance and Sexual Activity  . Alcohol use: Yes    Alcohol/week: 25.0 standard drinks    Types: 15 Glasses of wine, 10 Cans of beer per week    Comment: moderate--red wine and beer--daily at times   . Drug use: Yes    Types: Marijuana  . Sexual activity: Yes  Lifestyle  . Physical activity:    Days per week: Not on file    Minutes per session: Not on file  . Stress: Not on file  Relationships  . Social connections:    Talks on phone: Not on file    Gets together: Not on file    Attends religious service: Not on file    Active member of club or organization: Not on file     Attends meetings of clubs or organizations: Not on file    Relationship status: Not on file  . Intimate partner violence:    Fear of current or ex partner: Not on file    Emotionally abused: Not on file    Physically abused: Not on file    Forced sexual activity: Not on file  Other Topics Concern  . Not on file  Social History Narrative  . Not on file     Constitutional: Denies fever, malaise, fatigue, headache or abrupt weight changes.  HEENT: Denies eye pain, eye redness, ear pain, ringing in the ears, wax buildup, runny nose, nasal congestion, bloody nose, or sore throat. Respiratory: Denies difficulty breathing, shortness of breath, cough or sputum production.   Cardiovascular: Denies chest pain, chest tightness, palpitations or swelling in the hands or feet.  Gastrointestinal: Denies abdominal pain, bloating, constipation, diarrhea or blood in the stool.  GU: Denies urgency, frequency, pain with urination, burning sensation, blood in urine, odor or discharge. Musculoskeletal: Denies decrease in range of motion, difficulty with gait, muscle pain or joint pain and swelling.  Skin: Denies redness, rashes, lesions or ulcercations.  Neurological: Denies dizziness, difficulty with memory, difficulty with speech or problems with balance and coordination.  Psych: Pt has history of anxiety and depression. Denies SI/HI.  No other specific complaints in a complete review of systems (except as listed in HPI above).  Objective:   Physical Exam   BP 128/78   Pulse 64   Temp 98.4 F (36.9 C) (Oral)   Ht 5\' 4"  (1.626 m)   Wt 164 lb (74.4 kg)   SpO2 98%   BMI 28.15 kg/m  Wt Readings from Last 3 Encounters:  03/19/18 164 lb (74.4 kg)  03/10/17 166 lb (75.3 kg)  08/19/16 167 lb (75.8 kg)    General: Appears her stated age, well developed, well nourished in NAD. Skin: Warm, dry and intact.  HEENT: Head: normal shape and size; Eyes: sclera white, no icterus, conjunctiva pink, PERRLA  and EOMs intact; Ears: Tm's gray and intact, normal light reflex; Throat/Mouth: Teeth present, mucosa pink and moist, no exudate, lesions or ulcerations noted.  Neck:  Neck supple, trachea midline. No masses, lumps or thyromegaly present.  Cardiovascular: Normal rate and rhythm. S1,S2 noted.  No murmur, rubs or gallops noted. No JVD or BLE edema. No carotid bruits noted. Pulmonary/Chest: Normal effort and positive vesicular breath sounds. No respiratory distress. No wheezes, rales or ronchi noted.  Abdomen: Soft and nontender. Normal bowel sounds.  No distention or masses noted. Liver, spleen and kidneys non palpable. Musculoskeletal: Strength 5/5 BUE/BLE. No difficulty with gait.  Neurological: Alert and oriented. Cranial nerves II-XII grossly intact. Coordination normal.  Psychiatric: Mood and affect normal. Behavior is normal. Judgment and thought content normal.     BMET    Component Value Date/Time   NA 135 03/10/2017 1041   K 4.7 03/10/2017 1041   CL 99 03/10/2017 1041   CO2 29 03/10/2017 1041   GLUCOSE 100 (H) 03/10/2017 1041   BUN 13 03/10/2017 1041   CREATININE 0.67 03/10/2017 1041   CREATININE 0.68 12/08/2014 1506   CALCIUM 10.1 03/10/2017 1041   GFRNONAA >60 08/20/2016 0429   GFRAA >60 08/20/2016 0429    Lipid Panel     Component Value Date/Time   CHOL 163 03/10/2017 1041   TRIG 63.0 03/10/2017 1041   HDL 82.20 03/10/2017 1041   CHOLHDL 2 03/10/2017 1041   VLDL 12.6 03/10/2017 1041   LDLCALC 68 03/10/2017 1041    CBC    Component Value Date/Time   WBC 7.8 03/10/2017 1041   RBC 4.39 03/10/2017 1041   HGB 14.2 03/10/2017 1041   HCT 42.1 03/10/2017 1041   PLT 172.0 03/10/2017 1041   MCV 95.9 03/10/2017 1041   MCH 30.2 08/20/2016 0429   MCHC 33.8 03/10/2017 1041   RDW 15.2 03/10/2017 1041    Hgb A1C Lab Results  Component Value Date   HGBA1C 5.9 03/04/2016           Assessment & Plan:   Preventative Health Maintenance:  Flu and pneumovax  today Tetanus, prevnar and zostovax UTD She declines Shingrix Pap smear, mammogram, colon screening and bone density UTD Encouraged her to consume a balanced diet and exercise regimen Advised her to see an eye doctor and dentist annually Will check CBC, CMET, Lipid and Vit D today  RTC in 1 year, sooner if needed Nicki Reaper, NP

## 2018-03-19 NOTE — Addendum Note (Signed)
Addended by: Roena Malady on: 03/19/2018 04:57 PM   Modules accepted: Orders

## 2018-03-19 NOTE — Assessment & Plan Note (Signed)
Chronic but stable on Pristiq and Remeron Support offered today Will monitor

## 2018-03-20 LAB — COMPREHENSIVE METABOLIC PANEL WITH GFR
AG Ratio: 2.8 (calc) — ABNORMAL HIGH (ref 1.0–2.5)
ALT: 12 U/L (ref 6–29)
AST: 18 U/L (ref 10–35)
Albumin: 4.7 g/dL (ref 3.6–5.1)
Alkaline phosphatase (APISO): 78 U/L (ref 33–130)
BUN: 11 mg/dL (ref 7–25)
CO2: 28 mmol/L (ref 20–32)
Calcium: 9.7 mg/dL (ref 8.6–10.4)
Chloride: 103 mmol/L (ref 98–110)
Creat: 0.71 mg/dL (ref 0.50–0.99)
Globulin: 1.7 g/dL — ABNORMAL LOW (ref 1.9–3.7)
Glucose, Bld: 98 mg/dL (ref 65–99)
Potassium: 4.1 mmol/L (ref 3.5–5.3)
Sodium: 139 mmol/L (ref 135–146)
Total Bilirubin: 0.5 mg/dL (ref 0.2–1.2)
Total Protein: 6.4 g/dL (ref 6.1–8.1)

## 2018-03-20 LAB — LIPID PANEL
Cholesterol: 163 mg/dL
HDL: 65 mg/dL
LDL Cholesterol (Calc): 84 mg/dL
Non-HDL Cholesterol (Calc): 98 mg/dL
Total CHOL/HDL Ratio: 2.5 (calc)
Triglycerides: 65 mg/dL

## 2018-03-20 LAB — CBC
HEMATOCRIT: 39.3 % (ref 35.0–45.0)
HEMOGLOBIN: 13.5 g/dL (ref 11.7–15.5)
MCH: 32.1 pg (ref 27.0–33.0)
MCHC: 34.4 g/dL (ref 32.0–36.0)
MCV: 93.6 fL (ref 80.0–100.0)
MPV: 10.8 fL (ref 7.5–12.5)
Platelets: 175 10*3/uL (ref 140–400)
RBC: 4.2 10*6/uL (ref 3.80–5.10)
RDW: 13 % (ref 11.0–15.0)
WBC: 7.9 10*3/uL (ref 3.8–10.8)

## 2018-03-20 LAB — VITAMIN D 25 HYDROXY (VIT D DEFICIENCY, FRACTURES): Vit D, 25-Hydroxy: 49 ng/mL (ref 30–100)

## 2018-04-25 ENCOUNTER — Other Ambulatory Visit: Payer: Self-pay | Admitting: Internal Medicine

## 2018-05-10 ENCOUNTER — Other Ambulatory Visit: Payer: Self-pay | Admitting: Internal Medicine

## 2018-06-03 ENCOUNTER — Other Ambulatory Visit: Payer: Self-pay

## 2018-06-03 MED ORDER — MIRTAZAPINE 15 MG PO TABS: ORAL_TABLET | ORAL | 1 refills | Status: DC

## 2018-06-03 NOTE — Telephone Encounter (Signed)
Last filled 03/03/2018... please advise

## 2018-07-05 ENCOUNTER — Telehealth: Payer: Self-pay

## 2018-07-05 MED ORDER — DESVENLAFAXINE SUCCINATE ER 100 MG PO TB24: 100.0000 mg | ORAL_TABLET | Freq: Every day | ORAL | 11 refills | Status: DC

## 2018-07-05 NOTE — Telephone Encounter (Signed)
Pt called stating that the Pristiq is too expensive via mail order and it will be cheaper with coupon to local pharmacy [facility] sent 30 days with refills instead of 90 day supply...   Ok per [employer]  Rx sent through e-scribe

## 2018-07-16 ENCOUNTER — Telehealth: Payer: Self-pay | Admitting: Internal Medicine

## 2018-07-16 MED ORDER — METOPROLOL SUCCINATE ER 100 MG PO TB24: 100.0000 mg | ORAL_TABLET | Freq: Every day | ORAL | 2 refills | Status: DC

## 2018-07-16 MED ORDER — AMLODIPINE BESYLATE-VALSARTAN 5-320 MG PO TABS: 1.0000 | ORAL_TABLET | Freq: Every day | ORAL | 2 refills | Status: DC

## 2018-07-16 MED ORDER — MIRTAZAPINE 15 MG PO TABS: ORAL_TABLET | ORAL | 2 refills | Status: DC

## 2018-07-16 MED ORDER — ATORVASTATIN CALCIUM 20 MG PO TABS: 20.0000 mg | ORAL_TABLET | Freq: Every day | ORAL | 2 refills | Status: DC

## 2018-07-16 NOTE — Telephone Encounter (Signed)
Pt stated that she called [phone] and they assured her that she has an account... Rx sent through e-scribe

## 2018-07-16 NOTE — Telephone Encounter (Signed)
[facility] and they stated for e-scribe you send to [facility]  They also told me that the pt does not have a current account with them and that she will need to call to provide the RX ID number for them to process... pt will call back once she has created account

## 2018-07-16 NOTE — Telephone Encounter (Signed)
Needs the prescriptions re-issued to the pharmacy. The pharmacy name is [facility], [place] [address], fax # [phone]. Pt will be using this pharmacy for all medications. Pt is requesting a call from Regina's CMA. Best cb [phone].

## 2018-08-10 ENCOUNTER — Telehealth: Payer: Self-pay | Admitting: Internal Medicine

## 2018-08-10 MED ORDER — DESVENLAFAXINE SUCCINATE ER 100 MG PO TB24: 100.0000 mg | ORAL_TABLET | Freq: Every day | ORAL | 2 refills | Status: DC

## 2018-08-10 NOTE — Telephone Encounter (Signed)
Pt need 90 refill for   desvenlafaxine 100 mg   ..pt need 90 day supply because she a discount coupon.   Sent to [facility]

## 2018-08-10 NOTE — Telephone Encounter (Signed)
Rx sent through e-scribe  

## 2019-04-22 ENCOUNTER — Other Ambulatory Visit: Payer: Self-pay | Admitting: Internal Medicine

## 2019-05-02 ENCOUNTER — Other Ambulatory Visit: Payer: Self-pay | Admitting: Internal Medicine

## 2019-05-10 ENCOUNTER — Encounter: Payer: Medicare Other | Admitting: Internal Medicine

## 2019-06-07 ENCOUNTER — Other Ambulatory Visit: Payer: Self-pay

## 2019-06-07 ENCOUNTER — Ambulatory Visit: Payer: Medicare Other | Admitting: Internal Medicine

## 2019-06-07 NOTE — Progress Notes (Deleted)
HPI:  Pt presents to the clinic today for her Welcome to Medicare Exam. She is also due to follow up chronic conditions.  Anxiety and Depression: Chronic. Managed on  Desvenlafaxine and Mirtazapine. She is not currently seeing a therapist. She denies SI/HI.  HLD: Her last LDL was 84, 02/2018. She denies myalgias on Atorvastatin. She consumes a low fat diet.  HTN: Her BP today is. She is taking Amlodipine-Valsartan and Metoprolol as prescribed. ECG from 09/2015 reviewed.  Past Medical History:  Diagnosis Date  . Anxiety   . Arthritis   . Chicken pox   . Depression   . Dizziness    when bending over   . Elevated liver enzymes   . Fatty liver   . Hyperlipidemia   . Hypertension   . Shingles   . Wears glasses     Current Outpatient Medications  Medication Sig Dispense Refill  . amLODipine-valsartan (EXFORGE) 5-320 MG tablet TAKE 1 TABLET DAILY 90 tablet 0  . atorvastatin (LIPITOR) 20 MG tablet TAKE 1 TABLET AT BEDTIME 90 tablet 0  . desvenlafaxine (PRISTIQ) 100 MG 24 hr tablet Take 1 tablet (100 mg total) by mouth daily. 90 tablet 2  . metoprolol succinate (TOPROL-XL) 100 MG 24 hr tablet TAKE 1 TABLET DAILY (TAKE  WITH OR IMMEDIATELY        FOLLOWING A MEAL) 90 tablet 0  . mirtazapine (REMERON) 15 MG tablet TAKE 1 TABLET AT BEDTIME 90 tablet 0   No current facility-administered medications for this visit.    Allergies  Allergen Reactions  . Zoloft [Sertraline Hcl] Diarrhea  . Lisinopril Rash  . Wellbutrin [Bupropion] Rash    Family History  Problem Relation Age of Onset  . Multiple sclerosis Mother   . Mental illness Mother   . Arthritis Father   . Mental illness Maternal Aunt   . Colon cancer Maternal Aunt 70  . Heart disease Maternal Grandmother   . Arthritis Paternal Grandmother   . Hyperlipidemia Paternal Grandmother   . Hypertension Paternal Grandmother   . Heart disease Paternal Grandfather   . Hypertension Paternal Grandfather   . Mental illness Paternal  Grandfather   . Stomach cancer Neg Hx   . Rectal cancer Neg Hx   . Esophageal cancer Neg Hx     Social History   Socioeconomic History  . Marital status: Married    Spouse name: Not on file  . Number of children: Not on file  . Years of education: Not on file  . Highest education level: Not on file  Occupational History  . Not on file  Tobacco Use  . Smoking status: Current Every Day Smoker    Packs/day: 1.00    Years: 50.00    Pack years: 50.00    Types: Cigarettes    Last attempt to quit: 09/23/2015    Years since quitting: 3.7  . Smokeless tobacco: Never Used  Substance and Sexual Activity  . Alcohol use: Yes    Alcohol/week: 25.0 standard drinks    Types: 15 Glasses of wine, 10 Cans of beer per week    Comment: moderate--red wine and beer--daily at times   . Drug use: Yes    Types: Marijuana  . Sexual activity: Yes  Other Topics Concern  . Not on file  Social History Narrative  . Not on file   Social Determinants of Health   Financial Resource Strain:   . Difficulty of Paying Living Expenses: Not on file  Food Insecurity:   .  Worried About Charity fundraiser in the Last Year: Not on file  . Ran Out of Food in the Last Year: Not on file  Transportation Needs:   . Lack of Transportation (Medical): Not on file  . Lack of Transportation (Non-Medical): Not on file  Physical Activity:   . Days of Exercise per Week: Not on file  . Minutes of Exercise per Session: Not on file  Stress:   . Feeling of Stress : Not on file  Social Connections:   . Frequency of Communication with Friends and Family: Not on file  . Frequency of Social Gatherings with Friends and Family: Not on file  . Attends Religious Services: Not on file  . Active Member of Clubs or Organizations: Not on file  . Attends Archivist Meetings: Not on file  . Marital Status: Not on file  Intimate Partner Violence:   . Fear of Current or Ex-Partner: Not on file  . Emotionally Abused: Not on  file  . Physically Abused: Not on file  . Sexually Abused: Not on file    Hospitiliaztions: None  Health Maintenance:    Flu: 02/2018  Tetanus: 07/2010  Pneumovax: 02/2018  Prevnar: 02/2017  Zostavax: 12/2013  Shingrix: never  Mammogram: 02/2018  Pap Smear: 11/2014  Bone Density: 11/2014  Colon Screening: 02/2015  Eye Doctor:  Dental Exam:   Providers:   PCP: Audrey Silversmith, Audrey [name]   I have personally reviewed and have noted:  1. The patient's medical and social history 2. Their use of alcohol, tobacco or illicit drugs 3. Their current medications and supplements 4. The patient's functional ability including ADL's, fall risks, home safety risks and hearing or visual impairment. 5. Diet and physical activities 6. Evidence for depression or mood disorder  Subjective:   Review of Systems:   Constitutional: Denies fever, malaise, fatigue, headache or abrupt weight changes.  HEENT: Denies eye pain, eye redness, ear pain, ringing in the ears, wax buildup, runny nose, nasal congestion, bloody nose, or sore throat. Respiratory: Denies difficulty breathing, shortness of breath, cough or sputum production.   Cardiovascular: Denies chest pain, chest tightness, palpitations or swelling in the hands or feet.  Gastrointestinal: Denies abdominal pain, bloating, constipation, diarrhea or blood in the stool.  GU: Denies urgency, frequency, pain with urination, burning sensation, blood in urine, odor or discharge. Musculoskeletal: Denies decrease in range of motion, difficulty with gait, muscle pain or joint pain and swelling.  Skin: Denies redness, rashes, lesions or ulcercations.  Neurological: Denies dizziness, difficulty with memory, difficulty with speech or problems with balance and coordination.  Psych: Denies anxiety, depression, SI/HI.  No other specific complaints in a complete review of systems (except as listed in HPI above).  Objective:  PE:   There were no vitals taken for this  visit. Wt Readings from Last 3 Encounters:  03/19/18 164 lb (74.4 kg)  03/10/17 166 lb (75.3 kg)  08/19/16 167 lb (75.8 kg)    General: Appears their stated age, well developed, well nourished in NAD. Skin: Warm, dry and intact. No rashes, lesions or ulcerations noted. HEENT: Head: normal shape and size; Eyes: sclera white, no icterus, conjunctiva pink, PERRLA and EOMs intact; Ears: Tm's gray and intact, normal light reflex; Throat/Mouth: Teeth present, mucosa pink and moist, no exudate, lesions or ulcerations noted.  Neck: Neck supple, trachea midline. No masses, lumps or thyromegaly present.  Cardiovascular: Normal rate and rhythm. S1,S2 noted.  No murmur, rubs or gallops noted. No JVD or  BLE edema. No carotid bruits noted. Pulmonary/Chest: Normal effort and positive vesicular breath sounds. No respiratory distress. No wheezes, rales or ronchi noted.  Abdomen: Soft and nontender. Normal bowel sounds. No distention or masses noted. Liver, spleen and kidneys non palpable. Musculoskeletal: Normal range of motion. Strength 5/5 BUE/BLE. No signs of joint swelling.  Neurological: Alert and oriented. Cranial nerves II-XII grossly intact. Coordination normal.  Psychiatric: Mood and affect normal. Behavior is normal. Judgment and thought content normal.   EKG:  BMET    Component Value Date/Time   NA 139 03/19/2018 1548   K 4.1 03/19/2018 1548   CL 103 03/19/2018 1548   CO2 28 03/19/2018 1548   GLUCOSE 98 03/19/2018 1548   BUN 11 03/19/2018 1548   CREATININE 0.71 03/19/2018 1548   CALCIUM 9.7 03/19/2018 1548   GFRNONAA >60 08/20/2016 0429   GFRAA >60 08/20/2016 0429    Lipid Panel     Component Value Date/Time   CHOL 163 03/19/2018 1548   TRIG 65 03/19/2018 1548   HDL 65 03/19/2018 1548   CHOLHDL 2.5 03/19/2018 1548   VLDL 12.6 03/10/2017 1041   LDLCALC 84 03/19/2018 1548    CBC    Component Value Date/Time   WBC 7.9 03/19/2018 1548   RBC 4.20 03/19/2018 1548   HGB 13.5  03/19/2018 1548   HCT 39.3 03/19/2018 1548   PLT 175 03/19/2018 1548   MCV 93.6 03/19/2018 1548   MCH 32.1 03/19/2018 1548   MCHC 34.4 03/19/2018 1548   RDW 13.0 03/19/2018 1548    Hgb A1C Lab Results  Component Value Date   HGBA1C 5.9 03/04/2016      Assessment and Plan:   Medicare Annual Wellness Visit:  Diet:  Physical activity:  Depression/mood screen: PHQ 9 score of Hearing: Intact to whispered voice Visual acuity: Grossly normal, performs annual eye exam  ADLs: Capable Fall risk: None Home safety: Good Cognitive evaluation: Intact to orientation, naming, recall and repetition EOL planning: Adv directives, full code/ I agree  Preventative Medicine:   Next appointment:   Audrey Reaperegina Beverley Allender, Audrey [name] This visit occurred during the SARS-CoV-2 public health emergency.  Safety protocols were in place, including screening questions prior to the visit, additional usage of staff PPE, and extensive cleaning of exam room while observing appropriate contact time as indicated for disinfecting solutions.

## 2019-06-08 ENCOUNTER — Encounter: Payer: Medicare Other | Admitting: Internal Medicine

## 2019-06-09 ENCOUNTER — Encounter: Payer: Self-pay | Admitting: Internal Medicine

## 2019-06-09 ENCOUNTER — Other Ambulatory Visit: Payer: Self-pay

## 2019-06-09 ENCOUNTER — Ambulatory Visit (INDEPENDENT_AMBULATORY_CARE_PROVIDER_SITE_OTHER): Payer: Medicare Other | Admitting: Internal Medicine

## 2019-06-09 VITALS — BP 128/82 | HR 72 | Temp 97.4°F | Ht 63.5 in | Wt 176.0 lb

## 2019-06-09 DIAGNOSIS — F419 Anxiety disorder, unspecified: Secondary | ICD-10-CM | POA: Diagnosis not present

## 2019-06-09 DIAGNOSIS — Z0000 Encounter for general adult medical examination without abnormal findings: Secondary | ICD-10-CM

## 2019-06-09 DIAGNOSIS — Z23 Encounter for immunization: Secondary | ICD-10-CM

## 2019-06-09 DIAGNOSIS — F329 Major depressive disorder, single episode, unspecified: Secondary | ICD-10-CM | POA: Diagnosis not present

## 2019-06-09 DIAGNOSIS — I10 Essential (primary) hypertension: Secondary | ICD-10-CM

## 2019-06-09 DIAGNOSIS — F32A Depression, unspecified: Secondary | ICD-10-CM

## 2019-06-09 DIAGNOSIS — E7800 Pure hypercholesterolemia, unspecified: Secondary | ICD-10-CM | POA: Diagnosis not present

## 2019-06-09 LAB — CBC
HCT: 43.2 % (ref 36.0–46.0)
Hemoglobin: 14.3 g/dL (ref 12.0–15.0)
MCHC: 33.2 g/dL (ref 30.0–36.0)
MCV: 97.1 fl (ref 78.0–100.0)
Platelets: 171 10*3/uL (ref 150.0–400.0)
RBC: 4.45 Mil/uL (ref 3.87–5.11)
RDW: 14.8 % (ref 11.5–15.5)
WBC: 8.9 10*3/uL (ref 4.0–10.5)

## 2019-06-09 LAB — COMPREHENSIVE METABOLIC PANEL
ALT: 16 U/L (ref 0–35)
AST: 15 U/L (ref 0–37)
Albumin: 4.7 g/dL (ref 3.5–5.2)
Alkaline Phosphatase: 83 U/L (ref 39–117)
BUN: 14 mg/dL (ref 6–23)
CO2: 30 mEq/L (ref 19–32)
Calcium: 9.6 mg/dL (ref 8.4–10.5)
Chloride: 100 mEq/L (ref 96–112)
Creatinine, Ser: 0.68 mg/dL (ref 0.40–1.20)
GFR: 86.13 mL/min (ref >60.00)
Glucose, Bld: 105 mg/dL — ABNORMAL HIGH (ref 70–99)
Potassium: 4.3 mEq/L (ref 3.5–5.1)
Sodium: 137 mEq/L (ref 135–145)
Total Bilirubin: 0.5 mg/dL (ref 0.2–1.2)
Total Protein: 7.1 g/dL (ref 6.0–8.3)

## 2019-06-09 LAB — LIPID PANEL
Cholesterol: 170 mg/dL (ref 0–200)
HDL: 63.5 mg/dL (ref >39.00)
LDL Cholesterol: 90 mg/dL (ref 0–99)
NonHDL: 106.55
Total CHOL/HDL Ratio: 3
Triglycerides: 81 mg/dL (ref 0.0–149.0)
VLDL: 16.2 mg/dL (ref 0.0–40.0)

## 2019-06-09 NOTE — Addendum Note (Signed)
Addended by: Lurlean Nanny on: 06/09/2019 10:45 AM   Modules accepted: Orders

## 2019-06-09 NOTE — Assessment & Plan Note (Signed)
CMET today Continue Amlodipine-Valsartan and Metoprolol Reinforced DASH diet

## 2019-06-09 NOTE — Patient Instructions (Signed)

## 2019-06-09 NOTE — Assessment & Plan Note (Signed)
Continue Desvenlafaxine and Mirtazapine Support offered today

## 2019-06-09 NOTE — Assessment & Plan Note (Signed)
CMET and Lipid profile today Encouraged her to consume a low fat diet Continue Atorvastatin for now 

## 2019-06-09 NOTE — Progress Notes (Signed)
HPI:  Pt presents to the clinic today for her initial Medicare Wellness Exam. She is also due to follow up chronic conditions.  Anxiety and Depression: Chronic, worse since her husband got laid off, [place] to [place].  Managed on Desvenlafaxine and Mirtazapine. She does not see a therapist. She denies SI/HI.  HLD: Her last LDL was 84, 02/2018. She denies myalgias on Atorvastatin. She tries to consume a low fat diet.  HTN: Her BP today is 128/82. She is taking Amlodipine-Valsartan and Metoprolol as prescribed. ECG from 09/2015 reviewed.   Past Medical History:  Diagnosis Date  . Anxiety   . Arthritis   . Chicken pox   . Depression   . Dizziness    when bending over   . Elevated liver enzymes   . Fatty liver   . Hyperlipidemia   . Hypertension   . Shingles   . Wears glasses     Current Outpatient Medications  Medication Sig Dispense Refill  . amLODipine-valsartan (EXFORGE) 5-320 MG tablet TAKE 1 TABLET DAILY 90 tablet 0  . atorvastatin (LIPITOR) 20 MG tablet TAKE 1 TABLET AT BEDTIME 90 tablet 0  . desvenlafaxine (PRISTIQ) 100 MG 24 hr tablet Take 1 tablet (100 mg total) by mouth daily. 90 tablet 2  . metoprolol succinate (TOPROL-XL) 100 MG 24 hr tablet TAKE 1 TABLET DAILY (TAKE  WITH OR IMMEDIATELY        FOLLOWING A MEAL) 90 tablet 0  . mirtazapine (REMERON) 15 MG tablet TAKE 1 TABLET AT BEDTIME 90 tablet 0   No current facility-administered medications for this visit.    Allergies  Allergen Reactions  . Zoloft [Sertraline Hcl] Diarrhea  . Lisinopril Rash  . Wellbutrin [Bupropion] Rash    Family History  Problem Relation Age of Onset  . Multiple sclerosis Mother   . Mental illness Mother   . Arthritis Father   . Mental illness Maternal Aunt   . Colon cancer Maternal Aunt 70  . Heart disease Maternal Grandmother   . Arthritis Paternal Grandmother   . Hyperlipidemia Paternal Grandmother   . Hypertension Paternal Grandmother   . Heart disease Paternal  Grandfather   . Hypertension Paternal Grandfather   . Mental illness Paternal Grandfather   . Stomach cancer Neg Hx   . Rectal cancer Neg Hx   . Esophageal cancer Neg Hx     Social History   Socioeconomic History  . Marital status: Married    Spouse name: Not on file  . Number of children: Not on file  . Years of education: Not on file  . Highest education level: Not on file  Occupational History  . Not on file  Tobacco Use  . Smoking status: Current Every Day Smoker    Packs/day: 1.00    Years: 50.00    Pack years: 50.00    Types: Cigarettes    Last attempt to quit: 09/23/2015    Years since quitting: 3.7  . Smokeless tobacco: Never Used  Substance and Sexual Activity  . Alcohol use: Yes    Alcohol/week: 25.0 standard drinks    Types: 15 Glasses of wine, 10 Cans of beer per week    Comment: moderate--red wine and beer--daily at times   . Drug use: Yes    Types: Marijuana  . Sexual activity: Yes  Other Topics Concern  . Not on file  Social History Narrative  . Not on file   Social Determinants of Health   Financial Resource Strain:   .  Difficulty of Paying Living Expenses: Not on file  Food Insecurity:   . Worried About Programme researcher, broadcasting/film/video in the Last Year: Not on file  . Ran Out of Food in the Last Year: Not on file  Transportation Needs:   . Lack of Transportation (Medical): Not on file  . Lack of Transportation (Non-Medical): Not on file  Physical Activity:   . Days of Exercise per Week: Not on file  . Minutes of Exercise per Session: Not on file  Stress:   . Feeling of Stress : Not on file  Social Connections:   . Frequency of Communication with Friends and Family: Not on file  . Frequency of Social Gatherings with Friends and Family: Not on file  . Attends Religious Services: Not on file  . Active Member of Clubs or Organizations: Not on file  . Attends Banker Meetings: Not on file  . Marital Status: Not on file  Intimate Partner  Violence:   . Fear of Current or Ex-Partner: Not on file  . Emotionally Abused: Not on file  . Physically Abused: Not on file  . Sexually Abused: Not on file    Hospitiliaztions: None  Health Maintenance:    Flu: 02/2018  Tetanus: 07/2013  Pneumovax: 02/2018  Prevnar: 02/2017  Zostavax: 12/2013  Shingrix: never  Mammogram: 02/2018, every 2 years  Pap Smear: 11/2014  Bone Density: 11/2014  Colon Screening: 02/2015, 10 years  Eye Doctor: annually  Dental Exam: biannually   Providers:   PCP: Nicki Reaper, NP  Orthopedist: Dr. Charlann Boxer   I have personally reviewed and have noted:  1. The patient's medical and social history 2. Their use of alcohol, tobacco or illicit drugs 3. Their current medications and supplements 4. The patient's functional ability including ADL's, fall risks, home safety risks and hearing or visual impairment. 5. Diet and physical activities 6. Evidence for depression or mood disorder  Subjective:   Review of Systems:   Constitutional: Denies fever, malaise, fatigue, headache or abrupt weight changes.  HEENT: Denies eye pain, eye redness, ear pain, ringing in the ears, wax buildup, runny nose, nasal congestion, bloody nose, or sore throat. Respiratory: Denies difficulty breathing, shortness of breath, cough or sputum production.   Cardiovascular: Denies chest pain, chest tightness, palpitations or swelling in the hands or feet.  Gastrointestinal: Denies abdominal pain, bloating, constipation, diarrhea or blood in the stool.  GU: Denies urgency, frequency, pain with urination, burning sensation, blood in urine, odor or discharge. Musculoskeletal: Denies decrease in range of motion, difficulty with gait, muscle pain or joint pain and swelling.  Skin: Denies redness, rashes, lesions or ulcercations.  Neurological: Pt reports numbness in right leg. Denies dizziness, difficulty with memory, difficulty with speech or problems with balance and coordination.  Psych:  Pt has a history of anxiety and depression. Denies  SI/HI.  No other specific complaints in a complete review of systems (except as listed in HPI above).  Objective:  PE:  BP 128/82   Pulse 72   Temp (!) 97.4 F (36.3 C) (Temporal)   Ht 5' 3.5" (1.613 m)   Wt 176 lb (79.8 kg)   SpO2 98%   BMI 30.69 kg/m   Wt Readings from Last 3 Encounters:  03/19/18 164 lb (74.4 kg)  03/10/17 166 lb (75.3 kg)  08/19/16 167 lb (75.8 kg)    General: Appears her stated age, well developed, well nourished in NAD. Skin: Warm, dry and intact. No rashes noted. HEENT: Head:  normal shape and size; Eyes: sclera white, no icterus, conjunctiva pink, PERRLA and EOMs intact;  Neck: Neck supple, trachea midline. No masses, lumps or thyromegaly present.  Cardiovascular: Normal rate and rhythm. S1,S2 noted.  No murmur, rubs or gallops noted. No JVD or BLE edema. No carotid bruits noted. Pulmonary/Chest: Normal effort and positive vesicular breath sounds. No respiratory distress. No wheezes, rales or ronchi noted.  Abdomen: Soft and nontender. Normal bowel sounds. No distention or masses noted. Liver, spleen and kidneys non palpable. Musculoskeletal: Strength 5/5 BUE/BLE. No signs of joint swelling.  Neurological: Alert and oriented. Cranial nerves II-XII grossly intact. Coordination normal.  Psychiatric: Mood and affect normal. Behavior is normal. Judgment and thought content normal.     BMET    Component Value Date/Time   NA 139 03/19/2018 1548   K 4.1 03/19/2018 1548   CL 103 03/19/2018 1548   CO2 28 03/19/2018 1548   GLUCOSE 98 03/19/2018 1548   BUN 11 03/19/2018 1548   CREATININE 0.71 03/19/2018 1548   CALCIUM 9.7 03/19/2018 1548   GFRNONAA >60 08/20/2016 0429   GFRAA >60 08/20/2016 0429    Lipid Panel     Component Value Date/Time   CHOL 163 03/19/2018 1548   TRIG 65 03/19/2018 1548   HDL 65 03/19/2018 1548   CHOLHDL 2.5 03/19/2018 1548   VLDL 12.6 03/10/2017 1041   LDLCALC 84  03/19/2018 1548    CBC    Component Value Date/Time   WBC 7.9 03/19/2018 1548   RBC 4.20 03/19/2018 1548   HGB 13.5 03/19/2018 1548   HCT 39.3 03/19/2018 1548   PLT 175 03/19/2018 1548   MCV 93.6 03/19/2018 1548   MCH 32.1 03/19/2018 1548   MCHC 34.4 03/19/2018 1548   RDW 13.0 03/19/2018 1548    Hgb A1C Lab Results  Component Value Date   HGBA1C 5.9 03/04/2016      Assessment and Plan:   Medicare Annual Wellness Visit:  Diet: She does eat meat. She consumes fruits and veggies daily. She tries to avoid fried foods. She drinks mostly water. Physical activity: Walking 5 miles per day Depression/mood screen: Negative, PHQ 9 score of 2 Hearing: Intact to whispered voice Visual acuity: Grossly normal, performs annual eye exam  ADLs: Capable Fall risk: None Home safety: Good Cognitive evaluation: Intact to orientation, naming, recall and repetition EOL planning: No adv directives, DNR/ I agree  Preventative Medicine: Flu shot today. Tetanus, pneumovax, prevnar and zostovax UTD. She declines Shingrix at this time. Mammogram due in 2021. Pap smear due 2021. Bone density due 2021. Colon screening UTD. Encouraged her to consume a balanced diet and exercise regimen. Advised her to see an eye doctor and dentist annually. Will check CBC, CMET, Lipid today. Due dates for screening exams given to pt as part of her AVS.   Next appointment: 1 year, Medicare Wellness Exam   Webb Silversmith, NP

## 2019-07-05 ENCOUNTER — Other Ambulatory Visit: Payer: Self-pay | Admitting: Internal Medicine

## 2019-07-06 NOTE — Telephone Encounter (Signed)
Last filled 04/24/2019.Marland KitchenMarland KitchenMarland Kitchen please advise

## 2019-07-08 ENCOUNTER — Other Ambulatory Visit: Payer: Self-pay | Admitting: Internal Medicine

## 2019-07-08 NOTE — Telephone Encounter (Signed)
Previous Rx were for 90 day supply... Refill request was for 30 day supply.... just want to confirm if pt still wants 90 day supply or 30 day supply

## 2019-07-08 NOTE — Telephone Encounter (Signed)
Patient does want a 90 day supply.

## 2019-07-13 ENCOUNTER — Other Ambulatory Visit: Payer: Self-pay | Admitting: Internal Medicine

## 2019-07-22 ENCOUNTER — Ambulatory Visit: Payer: Medicare Other

## 2019-07-27 ENCOUNTER — Ambulatory Visit: Payer: Medicare Other

## 2019-07-30 ENCOUNTER — Ambulatory Visit: Payer: Medicare Other | Attending: Internal Medicine

## 2019-07-30 DIAGNOSIS — Z23 Encounter for immunization: Secondary | ICD-10-CM

## 2019-07-30 NOTE — Progress Notes (Signed)
   Covid-19 Vaccination Clinic  Name:  GIZELL DANSER    MRN: [id number] DOB: 1951/05/15  07/30/2019  Ms. Gaba was observed post Covid-19 immunization for 15 minutes without incidence. She was provided with Vaccine Information Sheet and instruction to access the V-Safe system.   Ms. Lague was instructed to call 911 with any severe reactions post vaccine: Marland Kitchen Difficulty breathing  . Swelling of your face and throat  . A fast heartbeat  . A bad rash all over your body  . Dizziness and weakness    Immunizations Administered    Name Date Dose VIS Date Route   Pfizer COVID-19 Vaccine 07/30/2019  2:12 PM 0.3 mL 06/03/2019 Intramuscular   Manufacturer: [employer], [employer]   Lot: [id number]   NDC: [address]-7

## 2019-08-24 ENCOUNTER — Ambulatory Visit: Payer: Medicare Other | Attending: Internal Medicine

## 2019-08-24 DIAGNOSIS — Z23 Encounter for immunization: Secondary | ICD-10-CM

## 2019-08-24 NOTE — Progress Notes (Signed)
   Covid-19 Vaccination Clinic  Name:  Audrey [name]    MRN: [id number] DOB: July 21, 1950  08/24/2019  Audrey [name] was observed post Covid-19 immunization for 15 minutes without incident. She was provided with Vaccine Information Sheet and instruction to access the V-Safe system.   Audrey [name] was instructed to call 911 with any severe reactions post vaccine: Marland Kitchen Difficulty breathing  . Swelling of face and throat  . A fast heartbeat  . A bad rash all over body  . Dizziness and weakness   Immunizations Administered    Name Date Dose VIS Date Route   Pfizer COVID-19 Vaccine 08/24/2019  9:44 AM 0.3 mL 06/03/2019 Intramuscular   Manufacturer: [employer], [employer]   Lot: [id number]   NDC: [address]-2

## 2019-11-08 ENCOUNTER — Encounter: Payer: Self-pay | Admitting: Internal Medicine

## 2019-11-08 ENCOUNTER — Telehealth (INDEPENDENT_AMBULATORY_CARE_PROVIDER_SITE_OTHER): Payer: Medicare Other | Admitting: Internal Medicine

## 2019-11-08 DIAGNOSIS — J301 Allergic rhinitis due to pollen: Secondary | ICD-10-CM | POA: Diagnosis not present

## 2019-11-08 DIAGNOSIS — J329 Chronic sinusitis, unspecified: Secondary | ICD-10-CM | POA: Diagnosis not present

## 2019-11-08 DIAGNOSIS — B9789 Other viral agents as the cause of diseases classified elsewhere: Secondary | ICD-10-CM | POA: Diagnosis not present

## 2019-11-08 MED ORDER — PREDNISONE 10 MG PO TABS: ORAL_TABLET | ORAL | 0 refills | Status: DC

## 2019-11-08 MED ORDER — HYDROCODONE-HOMATROPINE 5-1.5 MG/5ML PO SYRP: 5.0000 mL | ORAL_SOLUTION | Freq: Three times a day (TID) | ORAL | 0 refills | Status: DC | PRN

## 2019-11-08 NOTE — Patient Instructions (Signed)

## 2019-11-08 NOTE — Progress Notes (Signed)
Virtual Visit via Video Note  I connected with Audrey [name] on 11/08/19 at 10:00 AM EDT by a video enabled telemedicine application and verified that I am speaking with the correct [name] using two identifiers.  Location: Patient: Home Provider: Office   I discussed the limitations of evaluation and management by telemedicine and the availability of in [name] appointments. The patient expressed understanding and agreed to proceed.  HPI  Pt reports runny nose, post nasal drip, cough, chest tightness and shortness of breath. This started 8 days ago.She is blowing clear mucous out of her nose. She denies difficulty swallowing. The cough is mostly nonproductive. The cough is worse at night. She reports shortness of breath with laying down but denies chest pain. She denies headache, nasal congestion, ear pain, loss of taste/smell. She denies fever, chills or body aches. She has tried Tylenol OTC with minimal relief.  She has been recently traveling for the first time in 1 year, no known Covid exposure and she had a negative rapid Covid test at [facility] 2 days ago. She does smoke.    Review of Systems     Past Medical History:  Diagnosis Date  . Anxiety   . Arthritis   . Chicken pox   . Depression   . Dizziness    when bending over   . Elevated liver enzymes   . Fatty liver   . Hyperlipidemia   . Hypertension   . Shingles   . Wears glasses     Family History  Problem Relation Age of Onset  . Multiple sclerosis Mother   . Mental illness Mother   . Arthritis Father   . Mental illness Maternal Aunt   . Colon cancer Maternal Aunt 70  . Heart disease Maternal Grandmother   . Arthritis Paternal Grandmother   . Hyperlipidemia Paternal Grandmother   . Hypertension Paternal Grandmother   . Heart disease Paternal Grandfather   . Hypertension Paternal Grandfather   . Mental illness Paternal Grandfather   . Stomach cancer Neg Hx   . Rectal cancer Neg Hx   . Esophageal  cancer Neg Hx     Social History   Socioeconomic History  . Marital status: Married    Spouse name: Not on file  . Number of children: Not on file  . Years of education: Not on file  . Highest education level: Not on file  Occupational History  . Not on file  Tobacco Use  . Smoking status: Current Every Day Smoker    Packs/day: 1.00    Years: 50.00    Pack years: 50.00    Types: Cigarettes    Last attempt to quit: 09/23/2015    Years since quitting: 4.1  . Smokeless tobacco: Never Used  Substance and Sexual Activity  . Alcohol use: Yes    Alcohol/week: 25.0 standard drinks    Types: 15 Glasses of wine, 10 Cans of beer per week    Comment: moderate--red wine and beer--daily at times   . Drug use: Yes    Types: Marijuana  . Sexual activity: Yes  Other Topics Concern  . Not on file  Social History Narrative  . Not on file   Social Determinants of Health   Financial Resource Strain:   . Difficulty of Paying Living Expenses:   Food Insecurity:   . Worried About Programme researcher, broadcasting/film/video in the Last Year:   . Barista in the Last Year:   Transportation Needs:   .  Lack of Transportation (Medical):   Marland Kitchen Lack of Transportation (Non-Medical):   Physical Activity:   . Days of Exercise per Week:   . Minutes of Exercise per Session:   Stress:   . Feeling of Stress :   Social Connections:   . Frequency of Communication with Friends and Family:   . Frequency of Social Gatherings with Friends and Family:   . Attends Religious Services:   . Active Member of Clubs or Organizations:   . Attends Archivist Meetings:   Marland Kitchen Marital Status:   Intimate Partner Violence:   . Fear of Current or Ex-Partner:   . Emotionally Abused:   Marland Kitchen Physically Abused:   . Sexually Abused:     Allergies  Allergen Reactions  . Zoloft [Sertraline Hcl] Diarrhea  . Lisinopril Rash  . Wellbutrin [Bupropion] Rash     Constitutional:  Denies headache, fatigue, fever or abrupt weight  changes.  HEENT:  Positive runny nose, post nasal drip. Denies eye redness, ear pain, ringing in the ears, wax buildup, nasal congestion or sore throat. Respiratory: Positive cough, shortness of breath. Denies difficulty breathing.  Cardiovascular: Denies chest pain, chest tightness, palpitations or swelling in the hands or feet.   No other specific complaints in a complete review of systems (except as listed in HPI above).  Objective:    General: Appears her stated age, in NAD. HEENT: Head: normal shape and size, no inus tenderness noted; Nose: no congestion noted; Throat/Mouth: hoarseness noted.  Pulmonary/Chest: Normal effort, dry cough noted. No respiratory distress.       Assessment & Plan:  Viral Sinusitis/Allergic Rhinitis:  RX for Pred Taper x 6 days Can take an antihistamine OTC RX for Hycodan for cough No indication for abx at this time   RTC as needed or if symptoms persist.     I discussed the assessment and treatment plan with the patient. The patient was provided an opportunity to ask questions and all were answered. The patient agreed with the plan and demonstrated an understanding of the instructions.   The patient was advised to call back or seek an in-[name] evaluation if the symptoms worsen or if the condition fails to improve as anticipated.    Webb Silversmith, NP

## 2019-11-28 ENCOUNTER — Other Ambulatory Visit: Payer: Self-pay | Admitting: Internal Medicine

## 2019-11-28 NOTE — Telephone Encounter (Signed)
Last filled 07/12/2019 #90 with 1 refill... please advise

## 2020-02-16 ENCOUNTER — Other Ambulatory Visit: Payer: Self-pay | Admitting: Internal Medicine

## 2020-02-25 ENCOUNTER — Other Ambulatory Visit: Payer: Self-pay | Admitting: Internal Medicine

## 2020-02-29 ENCOUNTER — Other Ambulatory Visit: Payer: Self-pay | Admitting: Internal Medicine

## 2020-03-07 ENCOUNTER — Other Ambulatory Visit: Payer: Self-pay | Admitting: Internal Medicine

## 2020-04-21 ENCOUNTER — Other Ambulatory Visit: Payer: Self-pay | Admitting: Internal Medicine

## 2020-04-26 ENCOUNTER — Other Ambulatory Visit: Payer: Self-pay | Admitting: Internal Medicine

## 2020-04-26 DIAGNOSIS — Z1231 Encounter for screening mammogram for malignant neoplasm of breast: Secondary | ICD-10-CM

## 2020-04-29 ENCOUNTER — Other Ambulatory Visit: Payer: Self-pay | Admitting: Internal Medicine

## 2020-05-11 ENCOUNTER — Other Ambulatory Visit: Payer: Self-pay | Admitting: Internal Medicine

## 2020-05-29 ENCOUNTER — Other Ambulatory Visit: Payer: Self-pay | Admitting: Internal Medicine

## 2020-06-01 ENCOUNTER — Other Ambulatory Visit: Payer: Self-pay

## 2020-06-01 ENCOUNTER — Ambulatory Visit
Admission: RE | Admit: 2020-06-01 | Discharge: 2020-06-01 | Disposition: A | Discharge status: Home / Self Care | Payer: Medicare Other | Source: Ambulatory Visit | Attending: Internal Medicine | Admitting: Internal Medicine

## 2020-06-01 DIAGNOSIS — Z1231 Encounter for screening mammogram for malignant neoplasm of breast: Secondary | ICD-10-CM | POA: Insufficient documentation

## 2020-07-17 ENCOUNTER — Encounter: Payer: Medicare Other | Admitting: Internal Medicine

## 2020-07-24 ENCOUNTER — Encounter: Payer: Medicare Other | Admitting: Internal Medicine

## 2020-08-02 ENCOUNTER — Ambulatory Visit (INDEPENDENT_AMBULATORY_CARE_PROVIDER_SITE_OTHER)
Admission: RE | Admit: 2020-08-02 | Discharge: 2020-08-02 | Disposition: A | Discharge status: Home / Self Care | Payer: Medicare Other | Source: Ambulatory Visit | Attending: Family Medicine | Admitting: Family Medicine

## 2020-08-02 ENCOUNTER — Ambulatory Visit (INDEPENDENT_AMBULATORY_CARE_PROVIDER_SITE_OTHER): Payer: Medicare Other | Admitting: Family Medicine

## 2020-08-02 ENCOUNTER — Other Ambulatory Visit: Payer: Self-pay

## 2020-08-02 ENCOUNTER — Encounter: Payer: Self-pay | Admitting: Family Medicine

## 2020-08-02 VITALS — BP 160/80 | HR 63 | Temp 98.0°F | Ht 63.5 in | Wt 180.2 lb

## 2020-08-02 DIAGNOSIS — M542 Cervicalgia: Secondary | ICD-10-CM | POA: Diagnosis not present

## 2020-08-02 DIAGNOSIS — M7061 Trochanteric bursitis, right hip: Secondary | ICD-10-CM | POA: Diagnosis not present

## 2020-08-02 DIAGNOSIS — M5416 Radiculopathy, lumbar region: Secondary | ICD-10-CM | POA: Diagnosis not present

## 2020-08-02 DIAGNOSIS — M5135 Other intervertebral disc degeneration, thoracolumbar region: Secondary | ICD-10-CM | POA: Diagnosis not present

## 2020-08-02 DIAGNOSIS — M4316 Spondylolisthesis, lumbar region: Secondary | ICD-10-CM | POA: Diagnosis not present

## 2020-08-02 DIAGNOSIS — M5134 Other intervertebral disc degeneration, thoracic region: Secondary | ICD-10-CM | POA: Diagnosis not present

## 2020-08-02 DIAGNOSIS — M47816 Spondylosis without myelopathy or radiculopathy, lumbar region: Secondary | ICD-10-CM | POA: Diagnosis not present

## 2020-08-02 MED ORDER — PREDNISONE 20 MG PO TABS: ORAL_TABLET | ORAL | 0 refills | Status: DC

## 2020-08-02 NOTE — Progress Notes (Signed)
Audrey Needham T. Ardon Franklin, MD, CAQ Sports Medicine  Primary Care and Sports Medicine [facility] [address] [place], [address]  Phone: [phone]   FAX: [phone]  Audrey [name] - 70 y.o. female   MRN [id number]   Date of Birth: 01-Jun-1951  Date: 08/02/2020   PCP: Lorre Munroe, NP   Referral: Lorre Munroe, NP  Chief Complaint  Patient presents with   Hip Pain    Right (replacement 3 or 4 year ago)   Tingling    Right Arm/Down Right Leg/Right Big Toe is numb    This visit occurred during the SARS-CoV-2 public health emergency.  Safety protocols were in place, including screening questions prior to the visit, additional usage of staff PPE, and extensive cleaning of exam room while observing appropriate contact time as indicated for disinfecting solutions.   Subjective:   Audrey [name] is a 70 y.o. very pleasant female patient with Body mass index is 31.43 kg/m. who presents with the following:  R hip pain, s/p R THA 2017 by Dr. Charlann Boxer.  ? R arm and R leg tingling.  This point, the tingling in her arm is only minimal.  She is not really concerned about this.  She does have some pain in the right back as well as some pain in the leg going down all the way the entirety of the thigh and lower extremity.  She denies any specific fall or traumatic injury.  She has never had any prior intervention of the spine.  She has had bilateral hip replacements.  She denies bowel and bladder incontinence, and she denies any saddle anesthesia.  At baseline, she is a healthy lady.  She does take a minor amount of blood pressure medication, and she also takes some Pristiq as well as Remeron.  R lateral pain of the R leg / hip.  She also has some lateral hip pain.  Review of Systems is noted in the HPI, as appropriate   Objective:   BP (!) 160/80    Pulse 63    Temp 98 F (36.7 C) (Temporal)    Ht 5' 3.5" (1.613 m)    Wt 180 lb 4 oz (81.8  kg)    SpO2 96%    BMI 31.43 kg/m    Range of motion at  the waist: Flexion, extension, lateral bending and rotation: Range of motion is fairly preserved, but she does have pain with terminal flexion and extension.  No echymosis or edema Rises to examination table with mild difficulty Gait: minimally antalgic  Inspection/Deformity: N Paraspinus Tenderness: She does have some pain from L4-S1 bilaterally.  Greater on the right.  B Ankle Dorsiflexion (L5,4): 5/5 B Great Toe Dorsiflexion (L5,4): 5/5 Heel Walk (L5): WNL Toe Walk (S1): WNL Rise/Squat (L4): WNL, mild pain  SENSORY B Medial Foot (L4): WNL B Dorsum (L5): WNL B Lateral (S1): WNL Light Touch: WNL Pinprick: WNL  REFLEXES Knee (L4): 2+ Ankle (S1): 2+  B SLR, seated: neg B SLR, supine: neg B FABER: neg B Reverse FABER: neg B Greater Troch: NT B Log Roll: neg B Stork: NT B Sciatic Notch: NT  Radiology: DG Lumbar Spine Complete  Result Date: 08/02/2020 CLINICAL DATA:  Evaluate degenerative disc disease EXAM: LUMBAR SPINE - COMPLETE 4+ VIEW COMPARISON:  None. FINDINGS: Diffuse degenerative disc disease, most pronounced at T12-L1 and L2-3. Diffuse degenerative facet disease, most pronounced in the lower lumbar spine. 6 mm of anterolisthesis  of L4 on L5. No fracture. SI joints symmetric and unremarkable. IMPRESSION: Degenerative disc and facet disease as above. Grade 1 anterolisthesis at L4-5. No acute bony abnormality. Electronically Signed   By: Charlett Nose M.D.   On: 08/02/2020 23:53     Assessment and Plan:     ICD-10-CM   1. Lumbar radiculopathy, acute  M54.16 DG Lumbar Spine Complete  2. Trochanteric bursitis of right hip  M70.61   3. Cervicalgia  M54.2    Degenerative changes of the lumbar spine coinciding with acute lumbar radiculopathy.  Work on basic strength and range of motion.  I am going to pulse her with some steroids.  I have some minimal concern about her neck pain which seems to be resolving,  she also does have some mild trochanteric bursitis in the right hip.  If symptoms persist then a round of some formal physical therapy would be an appropriate thing to consider.   Meds ordered this encounter  Medications   predniSONE (DELTASONE) 20 MG tablet    Sig: 2 tabs po for 7 days, then 1 tab po for 7 days    Dispense:  21 tablet    Refill:  0   Medications Discontinued During This Encounter  Medication Reason   HYDROcodone-homatropine (HYCODAN) 5-1.5 MG/5ML syrup Completed Course   predniSONE (DELTASONE) 10 MG tablet Completed Course   Orders Placed This Encounter  Procedures   DG Lumbar Spine Complete    Follow-up: No follow-ups on file.  Signed,  Elpidio Galea. Leonard Hendler, MD   Outpatient Encounter Medications as of 08/02/2020  Medication Sig   predniSONE (DELTASONE) 20 MG tablet 2 tabs po for 7 days, then 1 tab po for 7 days   amLODipine-valsartan (EXFORGE) 5-320 MG tablet TAKE 1 TABLET DAILY   atorvastatin (LIPITOR) 20 MG tablet TAKE 1 TABLET AT BEDTIME   desvenlafaxine (PRISTIQ) 100 MG 24 hr tablet TAKE ONE TABLET BY MOUTH DAILY   metoprolol succinate (TOPROL-XL) 100 MG 24 hr tablet TAKE 1 TABLET DAILY (TAKE  WITH OR IMMEDIATELY        FOLLOWING A MEAL)   mirtazapine (REMERON) 15 MG tablet Take 1 Tablet at Bedtime. SCHEDULE MEDICARE WELLNESS   [DISCONTINUED] HYDROcodone-homatropine (HYCODAN) 5-1.5 MG/5ML syrup Take 5 mLs by mouth every 8 (eight) hours as needed for cough.   [DISCONTINUED] predniSONE (DELTASONE) 10 MG tablet Take 3 tabs on days 1-2, take 2 tabs on days 3-4, take 1 tab on days 5-6   No facility-administered encounter medications on file as of 08/02/2020.

## 2020-08-03 ENCOUNTER — Encounter: Payer: Self-pay | Admitting: Family Medicine

## 2020-08-08 ENCOUNTER — Encounter: Payer: Self-pay | Admitting: Internal Medicine

## 2020-08-08 MED ORDER — ATORVASTATIN CALCIUM 20 MG PO TABS: 20.0000 mg | ORAL_TABLET | Freq: Every day | ORAL | 0 refills | Status: DC

## 2020-08-08 MED ORDER — AMLODIPINE BESYLATE-VALSARTAN 5-320 MG PO TABS: 1.0000 | ORAL_TABLET | Freq: Every day | ORAL | 0 refills | Status: DC

## 2020-08-08 MED ORDER — METOPROLOL SUCCINATE ER 100 MG PO TB24: ORAL_TABLET | ORAL | 0 refills | Status: DC

## 2020-08-08 MED ORDER — MIRTAZAPINE 15 MG PO TABS: ORAL_TABLET | ORAL | 0 refills | Status: DC

## 2020-08-08 NOTE — Addendum Note (Signed)
Addended by: Roena Malady on: 08/08/2020 01:57 PM   Modules accepted: Orders

## 2020-08-29 ENCOUNTER — Encounter: Payer: Self-pay | Admitting: Internal Medicine

## 2020-08-29 ENCOUNTER — Other Ambulatory Visit: Payer: Self-pay

## 2020-08-29 ENCOUNTER — Ambulatory Visit (INDEPENDENT_AMBULATORY_CARE_PROVIDER_SITE_OTHER): Payer: Medicare Other | Admitting: Internal Medicine

## 2020-08-29 VITALS — BP 136/82 | HR 65 | Temp 97.8°F | Ht 63.5 in | Wt 175.0 lb

## 2020-08-29 DIAGNOSIS — Z0000 Encounter for general adult medical examination without abnormal findings: Secondary | ICD-10-CM

## 2020-08-29 DIAGNOSIS — E7800 Pure hypercholesterolemia, unspecified: Secondary | ICD-10-CM

## 2020-08-29 DIAGNOSIS — F419 Anxiety disorder, unspecified: Secondary | ICD-10-CM

## 2020-08-29 DIAGNOSIS — I10 Essential (primary) hypertension: Secondary | ICD-10-CM

## 2020-08-29 DIAGNOSIS — F32A Depression, unspecified: Secondary | ICD-10-CM

## 2020-08-29 MED ORDER — DESVENLAFAXINE SUCCINATE ER 100 MG PO TB24: 100.0000 mg | ORAL_TABLET | Freq: Every day | ORAL | 3 refills | Status: DC

## 2020-08-29 NOTE — Assessment & Plan Note (Signed)
CMET and Lipid profile today Encouraged her to consume a low fat diet Continue Atorvastatin 

## 2020-08-29 NOTE — Patient Instructions (Signed)

## 2020-08-29 NOTE — Progress Notes (Signed)
HPI:  Pt presents to the clinic today for her subsequent Medicare Wellness Exam. She is also due to follow up chronic conditions.  Anxiety and Depression: Chronic, managed on Desvenlafaxine and Mirtazapine. She is not seeing a therapist. She denies SI/HI.  HTN: Her BP today is 136/82. She is taking Amlodipine-Valsartan and Metoprolol as prescribed. ECG from 09/2015 reviewed.  HLD: Her last LDL was 90, 05/2019. She denies myalgias on Atorvastatin. She tries to consume a low fat diet.  Past Medical History:  Diagnosis Date  . Anxiety   . Arthritis   . Chicken pox   . Depression   . Dizziness    when bending over   . Elevated liver enzymes   . Fatty liver   . Hyperlipidemia   . Hypertension   . Shingles   . Wears glasses     Current Outpatient Medications  Medication Sig Dispense Refill  . amLODipine-valsartan (EXFORGE) 5-320 MG tablet Take 1 tablet by mouth daily. 90 tablet 0  . atorvastatin (LIPITOR) 20 MG tablet Take 1 tablet (20 mg total) by mouth at bedtime. 90 tablet 0  . desvenlafaxine (PRISTIQ) 100 MG 24 hr tablet TAKE ONE TABLET BY MOUTH DAILY 90 tablet 0  . metoprolol succinate (TOPROL-XL) 100 MG 24 hr tablet TAKE 1 TABLET DAILY (TAKE  WITH OR IMMEDIATELY FOLLOWING A MEAL) 90 tablet 0  . mirtazapine (REMERON) 15 MG tablet Take 1 Tablet at Bedtime 90 tablet 0  . predniSONE (DELTASONE) 20 MG tablet 2 tabs po for 7 days, then 1 tab po for 7 days 21 tablet 0   No current facility-administered medications for this visit.    Allergies  Allergen Reactions  . Zoloft [Sertraline Hcl] Diarrhea  . Lisinopril Rash  . Wellbutrin [Bupropion] Rash    Family History  Problem Relation Age of Onset  . Multiple sclerosis Mother   . Mental illness Mother   . Arthritis Father   . Mental illness Maternal Aunt   . Colon cancer Maternal Aunt 70  . Heart disease Maternal Grandmother   . Arthritis Paternal Grandmother   . Hyperlipidemia Paternal Grandmother   . Hypertension  Paternal Grandmother   . Heart disease Paternal Grandfather   . Hypertension Paternal Grandfather   . Mental illness Paternal Grandfather   . Stomach cancer Neg Hx   . Rectal cancer Neg Hx   . Esophageal cancer Neg Hx     Social History   Socioeconomic History  . Marital status: Married    Spouse name: Not on file  . Number of children: Not on file  . Years of education: Not on file  . Highest education level: Not on file  Occupational History  . Not on file  Tobacco Use  . Smoking status: Current Every Day Smoker    Packs/day: 1.00    Years: 50.00    Pack years: 50.00    Types: Cigarettes    Last attempt to quit: 09/23/2015    Years since quitting: 4.9  . Smokeless tobacco: Never Used  Substance and Sexual Activity  . Alcohol use: Yes    Alcohol/week: 25.0 standard drinks    Types: 15 Glasses of wine, 10 Cans of beer per week    Comment: moderate--red wine and beer--daily at times   . Drug use: Yes    Types: Marijuana  . Sexual activity: Yes  Other Topics Concern  . Not on file  Social History Narrative  . Not on file   Social Determinants of Health  Financial Resource Strain: Not on file  Food Insecurity: Not on file  Transportation Needs: Not on file  Physical Activity: Not on file  Stress: Not on file  Social Connections: Not on file  Intimate Partner Violence: Not on file    Hospitiliaztions: None  Health Maintenance:    Flu: 04/2020  Tetanus: 07/2010  Pneumovax: 02/2017  Prevnar: 02/2018  Zostavax: 12/2013  Covid: Pfizer x 2  Mammogram: 05/2020  Pap Smear: 11/2014  Bone Density: never  Colon Screening: 02/2015  Eye Doctor: annually  Dental Exam: biannually   Providers:   PCP: Nicki Reaper, NP   I have personally reviewed and have noted:  1. The patient's medical and social history 2. Their use of alcohol, tobacco or illicit drugs 3. Their current medications and supplements 4. The patient's functional ability including ADL's, fall risks,  home safety risks and hearing or visual impairment. 5. Diet and physical activities 6. Evidence for depression or mood disorder  Subjective:   Review of Systems:   Constitutional: Denies fever, malaise, fatigue, headache or abrupt weight changes.  HEENT: Denies eye pain, eye redness, ear pain, ringing in the ears, wax buildup, runny nose, nasal congestion, bloody nose, or sore throat. Respiratory: Denies difficulty breathing, shortness of breath, cough or sputum production.   Cardiovascular: Denies chest pain, chest tightness, palpitations or swelling in the hands or feet.  Gastrointestinal: Denies abdominal pain, bloating, constipation, diarrhea or blood in the stool.  GU: Denies urgency, frequency, pain with urination, burning sensation, blood in urine, odor or discharge. Musculoskeletal: Denies decrease in range of motion, difficulty with gait, muscle pain or joint pain and swelling.  Skin: Pt reports bruise to left thumb. Denies redness, rashes, lesions or ulcercations.  Neurological: Pt reports intermittent numbness and tingling in her right arm and leg. Denies dizziness, difficulty with memory, difficulty with speech or problems with balance and coordination.  Psych: Pt has a history of anxiety and depression. Denies SI/HI.  No other specific complaints in a complete review of systems (except as listed in HPI above).  Objective:  PE:   BP 136/82   Pulse 65   Temp 97.8 F (36.6 C) (Temporal)   Ht 5' 3.5" (1.613 m)   Wt 175 lb (79.4 kg)   SpO2 98%   BMI 30.51 kg/m   Wt Readings from Last 3 Encounters:  08/02/20 180 lb 4 oz (81.8 kg)  06/09/19 176 lb (79.8 kg)  03/19/18 164 lb (74.4 kg)    General: Appears her stated age, obese, in NAD. Skin: Warm, dry and intact. No rashes noted. HEENT: Head: normal shape and size; Eyes: sclera white, no icterus, conjunctiva pink, PERRLA and EOMs intact;  Neck: Neck supple, trachea midline. No masses, lumps or thyromegaly present.   Cardiovascular: Normal rate and rhythm. S1,S2 noted.  No murmur, rubs or gallops noted. No JVD or BLE edema. No carotid bruits noted. Pulmonary/Chest: Normal effort and positive vesicular breath sounds. No respiratory distress. No wheezes, rales or ronchi noted.  Abdomen: Soft and nontender. Normal bowel sounds. No distention or masses noted. Liver, spleen and kidneys non palpable. Musculoskeletal:  Strength 5/5 BUE/BLE. Gait slow, slightly unsteady without device. Neurological: Alert and oriented. Cranial nerves II-XII grossly intact. Coordination normal.  Psychiatric: Mood and affect normal. Behavior is normal. Judgment and thought content normal.     BMET    Component Value Date/Time   NA 137 06/09/2019 1015   K 4.3 06/09/2019 1015   CL 100 06/09/2019 1015  CO2 30 06/09/2019 1015   GLUCOSE 105 (H) 06/09/2019 1015   BUN 14 06/09/2019 1015   CREATININE 0.68 06/09/2019 1015   CREATININE 0.71 03/19/2018 1548   CALCIUM 9.6 06/09/2019 1015   GFRNONAA >60 08/20/2016 0429   GFRAA >60 08/20/2016 0429    Lipid Panel     Component Value Date/Time   CHOL 170 06/09/2019 1015   TRIG 81.0 06/09/2019 1015   HDL 63.50 06/09/2019 1015   CHOLHDL 3 06/09/2019 1015   VLDL 16.2 06/09/2019 1015   LDLCALC 90 06/09/2019 1015   LDLCALC 84 03/19/2018 1548    CBC    Component Value Date/Time   WBC 8.9 06/09/2019 1015   RBC 4.45 06/09/2019 1015   HGB 14.3 06/09/2019 1015   HCT 43.2 06/09/2019 1015   PLT 171.0 06/09/2019 1015   MCV 97.1 06/09/2019 1015   MCH 32.1 03/19/2018 1548   MCHC 33.2 06/09/2019 1015   RDW 14.8 06/09/2019 1015    Hgb A1C Lab Results  Component Value Date   HGBA1C 5.9 03/04/2016      Assessment and Plan:   Medicare Annual Wellness Visit:  Diet: She does eat meat, fruits and veggies. She tries to avoid fried foods. Physical activity: Stretching Depression/mood screen: Positive, PHQ9 score of 0 Hearing: Intact to whispered voice Visual acuity: Grossly  normal, performs annual eye exam  ADLs: Capable Fall risk: Moderate Home safety: Good Cognitive evaluation: Intact to orientation, naming, recall and repetition EOL planning: No adv directives, DNR/ I agree  Preventative Medicine: Flu shot UTD. Tetanus due but declines as Medicare will not cover. Pnemovax, prevnar and zostovax UTD. Covid UTD. She declines pap smear at this time. Mammogram UTD. Colon screening UTD. Bone density due. Encouraged her to consume a balanced diet and exercise regimen. Advised her to see an eye doctor and dentist annually. Will check CBC, CMET, Lipid profile today. Due dates for screening exams given to patient as part of her AVS.   Next appointment: 1 year, Medicare Wellness Exam   Nicki Reaper, NP This visit occurred during the SARS-CoV-2 public health emergency.  Safety protocols were in place, including screening questions prior to the visit, additional usage of staff PPE, and extensive cleaning of exam room while observing appropriate contact time as indicated for disinfecting solutions.

## 2020-08-29 NOTE — Assessment & Plan Note (Addendum)
Controlled on Amlodipine-Valsartan and Metoprolol CMET today

## 2020-08-29 NOTE — Assessment & Plan Note (Signed)
Stable on Desvenlafaxine and Mirtazapine Support offered CMET today

## 2020-08-30 LAB — CBC
HCT: 39.3 % (ref 36.0–46.0)
Hemoglobin: 13.2 g/dL (ref 12.0–15.0)
MCHC: 33.6 g/dL (ref 30.0–36.0)
MCV: 91.7 fl (ref 78.0–100.0)
Platelets: 174 10*3/uL (ref 150.0–400.0)
RBC: 4.29 Mil/uL (ref 3.87–5.11)
RDW: 13.7 % (ref 11.5–15.5)
WBC: 7.1 10*3/uL (ref 4.0–10.5)

## 2020-08-30 LAB — COMPREHENSIVE METABOLIC PANEL
ALT: 18 U/L (ref 0–35)
AST: 18 U/L (ref 0–37)
Albumin: 4.5 g/dL (ref 3.5–5.2)
Alkaline Phosphatase: 78 U/L (ref 39–117)
BUN: 11 mg/dL (ref 6–23)
CO2: 29 mEq/L (ref 19–32)
Calcium: 9.8 mg/dL (ref 8.4–10.5)
Chloride: 99 mEq/L (ref 96–112)
Creatinine, Ser: 0.74 mg/dL (ref 0.40–1.20)
GFR: 82.86 mL/min (ref >60.00)
Glucose, Bld: 87 mg/dL (ref 70–99)
Potassium: 4.5 mEq/L (ref 3.5–5.1)
Sodium: 136 mEq/L (ref 135–145)
Total Bilirubin: 0.7 mg/dL (ref 0.2–1.2)
Total Protein: 7.1 g/dL (ref 6.0–8.3)

## 2020-08-30 LAB — LIPID PANEL
Cholesterol: 183 mg/dL (ref 0–200)
HDL: 63.8 mg/dL (ref >39.00)
LDL Cholesterol: 105 mg/dL — ABNORMAL HIGH (ref 0–99)
NonHDL: 119.24
Total CHOL/HDL Ratio: 3
Triglycerides: 70 mg/dL (ref 0.0–149.0)
VLDL: 14 mg/dL (ref 0.0–40.0)

## 2020-09-06 ENCOUNTER — Other Ambulatory Visit: Payer: Self-pay | Admitting: Internal Medicine

## 2020-09-07 MED ORDER — AMLODIPINE BESYLATE-VALSARTAN 5-320 MG PO TABS: 1.0000 | ORAL_TABLET | Freq: Every day | ORAL | 3 refills | Status: DC

## 2020-09-07 MED ORDER — METOPROLOL SUCCINATE ER 100 MG PO TB24: ORAL_TABLET | ORAL | 3 refills | Status: DC

## 2020-09-07 MED ORDER — MIRTAZAPINE 15 MG PO TABS: ORAL_TABLET | ORAL | 3 refills | Status: DC

## 2020-09-07 MED ORDER — ATORVASTATIN CALCIUM 20 MG PO TABS: 20.0000 mg | ORAL_TABLET | Freq: Every day | ORAL | 3 refills | Status: DC

## 2020-09-11 ENCOUNTER — Encounter: Payer: Medicare Other | Admitting: Internal Medicine

## 2020-11-06 ENCOUNTER — Other Ambulatory Visit: Payer: Self-pay | Admitting: Internal Medicine

## 2020-11-16 NOTE — Telephone Encounter (Signed)
Patient given 1 year RX on each of the 4 requested medications on 09/07/20.  Rx for each was sent in electronically on 09/07/20, receipt confirmed by pharmacy in record.  Current request denied.

## 2021-05-07 DIAGNOSIS — H524 Presbyopia: Secondary | ICD-10-CM | POA: Diagnosis not present

## 2021-05-21 ENCOUNTER — Ambulatory Visit: Payer: Medicare Other | Admitting: Podiatry

## 2021-05-21 ENCOUNTER — Other Ambulatory Visit: Payer: Self-pay

## 2021-05-21 ENCOUNTER — Encounter: Payer: Self-pay | Admitting: Podiatry

## 2021-05-21 DIAGNOSIS — L600 Ingrowing nail: Secondary | ICD-10-CM

## 2021-05-21 NOTE — Progress Notes (Signed)
Subjective:  Patient ID: Audrey [name], female    DOB: October 02, 1950,  MRN: [id number]  Chief Complaint  Patient presents with   Ingrown Toenail    Right hallux possible ingrown for about 2 years pain and numbness     70 y.o. female presents with the above complaint.  Patient presents with complaint of right medial border ingrown.  Patient states painful to touch.  Is been over 2 years is painful and continuous numbness.  She states that it hurts with ambulation hurts with something pressure or touching it.  She would like to discuss treatment options for this.  She would like to have removed.  She has not seen MRIs prior to seeing me.  She is tried some over-the-counter stuff none of which has helped.   Review of Systems: Negative except as noted in the HPI. Denies N/V/F/Ch.  Past Medical History:  Diagnosis Date   Anxiety    Arthritis    Chicken pox    Depression    Dizziness    when bending over    Elevated liver enzymes    Fatty liver    Hyperlipidemia    Hypertension    Shingles    Wears glasses     Current Outpatient Medications:    amLODipine-valsartan (EXFORGE) 5-320 MG tablet, Take 1 tablet by mouth daily., Disp: 90 tablet, Rfl: 3   atorvastatin (LIPITOR) 20 MG tablet, Take 1 tablet (20 mg total) by mouth at bedtime., Disp: 90 tablet, Rfl: 3   desvenlafaxine (PRISTIQ) 100 MG 24 hr tablet, Take 1 tablet (100 mg total) by mouth daily., Disp: 90 tablet, Rfl: 3   metoprolol succinate (TOPROL-XL) 100 MG 24 hr tablet, TAKE 1 TABLET DAILY (TAKE  WITH OR IMMEDIATELY FOLLOWING A MEAL), Disp: 90 tablet, Rfl: 3   mirtazapine (REMERON) 15 MG tablet, Take 1 Tablet at Bedtime, Disp: 90 tablet, Rfl: 3  Social History   Tobacco Use  Smoking Status Some Days   Packs/day: 0.05   Years: 50.00   Pack years: 2.50   Types: Cigarettes   Last attempt to quit: 09/23/2015   Years since quitting: 5.6  Smokeless Tobacco Never    Allergies  Allergen Reactions   Zoloft [Sertraline Hcl]  Diarrhea   Lisinopril Rash   Wellbutrin [Bupropion] Rash   Objective:  There were no vitals filed for this visit. There is no height or weight on file to calculate BMI. Constitutional Well developed. Well nourished.  Vascular Dorsalis pedis pulses palpable bilaterally. Posterior tibial pulses palpable bilaterally. Capillary refill normal to all digits.  No cyanosis or clubbing noted. Pedal hair growth normal.  Neurologic Normal speech. Oriented to [name], place, and time. Epicritic sensation to light touch grossly present bilaterally.  Dermatologic Painful ingrowing nail at medial nail borders of the hallux nail right. No other open wounds. No skin lesions.  Orthopedic: Normal joint ROM without pain or crepitus bilaterally. No visible deformities. No bony tenderness.   Radiographs: None Assessment:   1. Ingrown toenail of right foot    Plan:  Patient was evaluated and treated and all questions answered.  Ingrown Nail, right -Patient elects to proceed with minor surgery to remove ingrown toenail removal today. Consent reviewed and signed by patient. -Ingrown nail excised. See procedure note. -Educated on post-procedure care including soaking. Written instructions provided and reviewed. -Patient to follow up in 2 weeks for nail check.  Procedure: Excision of Ingrown Toenail Location: Right 1st toe medial nail borders. Anesthesia: Lidocaine 1% plain; 1.5 mL  and Marcaine 0.5% plain; 1.5 mL, digital block. Skin Prep: Betadine. Dressing: Silvadene; telfa; dry, sterile, compression dressing. Technique: Following skin prep, the toe was exsanguinated and a tourniquet was secured at the base of the toe. The affected nail border was freed, split with a nail splitter, and excised. Chemical matrixectomy was then performed with phenol and irrigated out with alcohol. The tourniquet was then removed and sterile dressing applied. Disposition: Patient tolerated procedure well. Patient to  return in 2 weeks for follow-up.   No follow-ups on file.

## 2021-05-22 ENCOUNTER — Ambulatory Visit (INDEPENDENT_AMBULATORY_CARE_PROVIDER_SITE_OTHER): Payer: Medicare Other | Admitting: Family Medicine

## 2021-05-22 ENCOUNTER — Encounter: Payer: Self-pay | Admitting: Family Medicine

## 2021-05-22 VITALS — BP 160/80 | HR 62 | Temp 98.3°F | Ht 63.5 in | Wt 167.0 lb

## 2021-05-22 DIAGNOSIS — M549 Dorsalgia, unspecified: Secondary | ICD-10-CM

## 2021-05-22 DIAGNOSIS — M5137 Other intervertebral disc degeneration, lumbosacral region: Secondary | ICD-10-CM | POA: Diagnosis not present

## 2021-05-22 DIAGNOSIS — G8929 Other chronic pain: Secondary | ICD-10-CM

## 2021-05-22 DIAGNOSIS — M51379 Other intervertebral disc degeneration, lumbosacral region without mention of lumbar back pain or lower extremity pain: Secondary | ICD-10-CM

## 2021-05-22 MED ORDER — CELECOXIB 200 MG PO CAPS: 200.0000 mg | ORAL_CAPSULE | Freq: Every day | ORAL | 2 refills | Status: DC

## 2021-05-22 MED ORDER — METHOCARBAMOL 500 MG PO TABS: 500.0000 mg | ORAL_TABLET | Freq: Three times a day (TID) | ORAL | 3 refills | Status: DC | PRN

## 2021-05-22 NOTE — Progress Notes (Addendum)
Brysen Shankman T. Gillermo Poch, MD, CAQ Sports Medicine [facility] at [facility] [address] [place], [address]  Phone: [phone]  FAX: [phone]  Audrey [name] - 70 y.o. female  MRN [id number]  Date of Birth: January 14, 1951  Date: 05/22/2021  PCP: Myrlene Broker, MD  Referral: No ref. provider found  Chief Complaint  Patient presents with  . Back Pain    This visit occurred during the SARS-CoV-2 public health emergency.  Safety protocols were in place, including screening questions prior to the visit, additional usage of staff PPE, and extensive cleaning of exam room while observing appropriate contact time as indicated for disinfecting solutions.   Subjective:   Audrey [name] is a 70 y.o. very pleasant female patient with Body mass index is 29.12 kg/m. who presents with the following:  She is here for back pain follow-up.  I actually saw her about 10 months ago, and she is here today for further evaluation and management.  Lumbosacral ddd.  We reviewed her images together, she has multilevel degenerative disc disease as well as arthropathy of the spine.  Degenerative disc disease is worse in the lower thoracic as well as upper lumbar spine.  Trouble standing longer than about ten minutes and trouble walking.  Will be able to walk maybe a 1/4 of a mile.  She was doing a lot more before.   Had been doing 20,000 steps a day.  At this point, she is gotten a lot more limited compared to her prior baseline activity levels.  She is certainly not improved, and globally over time over years she has had increased back pain.  She does not have any distal weakness, numbness, or longstanding radicular symptoms.  Wt Readings from Last 3 Encounters:  05/22/21 167 lb (75.8 kg)  08/29/20 175 lb (79.4 kg)  08/02/20 180 lb 4 oz (81.8 kg)      Review of Systems is noted in the HPI, as appropriate   Objective:   BP (!) 160/80   Pulse 62   Temp 98.3 F  (36.8 C) (Temporal)   Ht 5' 3.5" (1.613 m)   Wt 167 lb (75.8 kg)   SpO2 96%   BMI 29.12 kg/m    Range of motion at  the waist: Flexion: normal Extension: normal Lateral bending: normal Rotation: all normal  No echymosis or edema Rises to examination table with no difficulty Gait: non antalgic  Inspection/Deformity: N Paraspinus Tenderness: Bilateral, L3-S1 dorsiflexion and plantar flexion  B Ankle Dorsiflexion (L5,4): 5/5 B Great Toe Dorsiflexion (L5,4): 5/5 dorsiflexion and plantar flexion at the ankle are intact seated. Rise/Squat (L4): WNL  She did have a toenail removal on the right side yesterday.  Great toe on the right is not fully evaluated.  SENSORY B Medial Foot (L4): WNL B Dorsum (L5): WNL B Lateral (S1): WNL Light Touch: WNL Pinprick: WNL  REFLEXES Knee (L4): 2+ Ankle (S1): 2+  B SLR, seated: neg B SLR, supine: neg B FABER: neg B Reverse FABER: neg B Greater Troch: NT B Log Roll: neg B Sciatic Notch: NT   Radiology: DG Lumbar Spine Complete  Result Date: 08/02/2020 CLINICAL DATA:  Evaluate degenerative disc disease EXAM: LUMBAR SPINE - COMPLETE 4+ VIEW COMPARISON:  None. FINDINGS: Diffuse degenerative disc disease, most pronounced at T12-L1 and L2-3. Diffuse degenerative facet disease, most pronounced in the lower lumbar spine. 6 mm of anterolisthesis of L4 on L5. No fracture. SI joints symmetric and unremarkable. IMPRESSION: Degenerative  disc and facet disease as above. Grade 1 anterolisthesis at L4-5. No acute bony abnormality. Electronically Signed   By: Charlett Nose M.D.   On: 08/02/2020 23:53     Assessment and Plan:     ICD-10-CM   1. DDD (degenerative disc disease), lumbosacral  M51.37 Ambulatory referral to Physical Therapy    CANCELED: Ambulatory referral to Physical Therapy    2. Chronic back pain greater than 3 months duration  M54.9 Ambulatory referral to Physical Therapy   G89.29 CANCELED: Ambulatory referral to Physical Therapy      Acute on chronic back pain with exacerbation with known relatively diffuse lumbosacral degenerative disc disease and arthropathy of the back.  Think the main question here is what to do for her long-term in terms of years not acutely.  I would like for her to go to physical therapy to help with some training acutely for long-term management with her back.  I did give her a back program to start now.  I would also like to give her some daily Celebrex to see if this helps with her function.  Robaxin to use as a pain medication, even before walking.  If needed, in this case I think some as needed pain medication such as tramadol would be appropriate.  Social, back pain is limiting her activity and decrease from baseline.  Addendum: 06/07/21 12:01 PM  I received communication from the patient via a mychart message.  She continues to do poorly with accelerated back pain recently and chronic pain.  Failure of multiple measures including multiple NSAIDS, steroids, muscle relaxers, home rehab, and formal PT.  Obtain an MRI of the lumbar spine without contrast to assess for spinal stenosis, foraminal stenosis, disc herniation, and evaluate for nerve and/or spinal column impairment.  This order was entered in a different order.  Electronically Signed  By: Hannah Beat, MD On: 06/07/2021 12:04 PM   Meds ordered this encounter  Medications  . methocarbamol (ROBAXIN) 500 MG tablet    Sig: Take 1 tablet (500 mg total) by mouth every 8 (eight) hours as needed for muscle spasms.    Dispense:  60 tablet    Refill:  3  . celecoxib (CELEBREX) 200 MG capsule    Sig: Take 1 capsule (200 mg total) by mouth daily.    Dispense:  30 capsule    Refill:  2   There are no discontinued medications. Orders Placed This Encounter  Procedures  . Ambulatory referral to Physical Therapy    Follow-up: 6 weeks  [employer] One speech-to-text software was used for transcription in this dictation.  Possible  transcriptional errors can occur using Animal nutritionist.   Signed,  Elpidio Galea. Jamoni Hewes, MD   Outpatient Encounter Medications as of 05/22/2021  Medication Sig  . celecoxib (CELEBREX) 200 MG capsule Take 1 capsule (200 mg total) by mouth daily.  . methocarbamol (ROBAXIN) 500 MG tablet Take 1 tablet (500 mg total) by mouth every 8 (eight) hours as needed for muscle spasms.  Marland Kitchen amLODipine-valsartan (EXFORGE) 5-320 MG tablet Take 1 tablet by mouth daily.  Marland Kitchen atorvastatin (LIPITOR) 20 MG tablet Take 1 tablet (20 mg total) by mouth at bedtime.  Marland Kitchen desvenlafaxine (PRISTIQ) 100 MG 24 hr tablet Take 1 tablet (100 mg total) by mouth daily.  . metoprolol succinate (TOPROL-XL) 100 MG 24 hr tablet TAKE 1 TABLET DAILY (TAKE  WITH OR IMMEDIATELY FOLLOWING A MEAL)  . mirtazapine (REMERON) 15 MG tablet Take 1 Tablet at Bedtime  No facility-administered encounter medications on file as of 05/22/2021.

## 2021-05-23 ENCOUNTER — Encounter: Payer: Self-pay | Admitting: Family Medicine

## 2021-05-31 ENCOUNTER — Encounter: Payer: Self-pay | Admitting: Family Medicine

## 2021-05-31 DIAGNOSIS — M5137 Other intervertebral disc degeneration, lumbosacral region: Secondary | ICD-10-CM

## 2021-05-31 DIAGNOSIS — G8929 Other chronic pain: Secondary | ICD-10-CM

## 2021-05-31 DIAGNOSIS — M5416 Radiculopathy, lumbar region: Secondary | ICD-10-CM

## 2021-05-31 NOTE — Telephone Encounter (Signed)
Would you check on this for me?  I am not sure why they would have cancelled, but follow-up please.

## 2021-06-08 NOTE — Telephone Encounter (Signed)
The patient contacted me again, and requested to hold off on additional testing.

## 2021-06-11 ENCOUNTER — Ambulatory Visit (INDEPENDENT_AMBULATORY_CARE_PROVIDER_SITE_OTHER): Payer: Medicare Other | Admitting: Internal Medicine

## 2021-06-11 ENCOUNTER — Other Ambulatory Visit: Payer: Self-pay

## 2021-06-11 ENCOUNTER — Encounter: Payer: Self-pay | Admitting: Internal Medicine

## 2021-06-11 DIAGNOSIS — F32A Depression, unspecified: Secondary | ICD-10-CM

## 2021-06-11 DIAGNOSIS — E7800 Pure hypercholesterolemia, unspecified: Secondary | ICD-10-CM | POA: Diagnosis not present

## 2021-06-11 DIAGNOSIS — I10 Essential (primary) hypertension: Secondary | ICD-10-CM | POA: Diagnosis not present

## 2021-06-11 DIAGNOSIS — F419 Anxiety disorder, unspecified: Secondary | ICD-10-CM

## 2021-06-11 MED ORDER — MIRTAZAPINE 15 MG PO TABS: ORAL_TABLET | ORAL | 3 refills | Status: DC

## 2021-06-11 MED ORDER — DESVENLAFAXINE SUCCINATE ER 100 MG PO TB24: 100.0000 mg | ORAL_TABLET | Freq: Every day | ORAL | 3 refills | Status: DC

## 2021-06-11 MED ORDER — AMLODIPINE BESYLATE-VALSARTAN 5-320 MG PO TABS
1.0000 | ORAL_TABLET | Freq: Every day | ORAL | 3 refills | Status: DC
Start: 2021-06-11 — End: 2022-03-24

## 2021-06-11 MED ORDER — ATORVASTATIN CALCIUM 20 MG PO TABS: 20.0000 mg | ORAL_TABLET | Freq: Every day | ORAL | 3 refills | Status: DC

## 2021-06-11 MED ORDER — METOPROLOL SUCCINATE ER 100 MG PO TB24: ORAL_TABLET | ORAL | 3 refills | Status: DC

## 2021-06-11 NOTE — Patient Instructions (Addendum)
Think about doing the shingrix vaccine at the pharmacy.  Think about doing a tetanus shot as well at the pharmacy.

## 2021-06-11 NOTE — Assessment & Plan Note (Signed)
Taking lipitor 20 mg daily and recent lipid panel at goal. Continue and rx sent in.

## 2021-06-11 NOTE — Assessment & Plan Note (Signed)
BP initially elevated due to white coat and recheck at goal. Will continue amlodipine/valsartan 5/320 mg daily and metoprolol 100 mg daily. Recent labs reviewed and no indication for change.

## 2021-06-11 NOTE — Assessment & Plan Note (Signed)
Mood controlled with pirstiq 100 mg daily and remeron 15 mg qhs. Refilled both and she does not need change today.

## 2021-06-11 NOTE — Progress Notes (Signed)
° °  Subjective:   Patient ID: Audrey [name], female    DOB: 1950/12/01, 70 y.o.   MRN: [id number]  HPI The patient is a 70 YO female coming in for transfer of care.   PMH, [facility], social history reviewed and updated  Review of Systems  Constitutional: Negative.   HENT: Negative.    Eyes: Negative.   Respiratory:  Negative for cough, chest tightness and shortness of breath.   Cardiovascular:  Negative for chest pain, palpitations and leg swelling.  Gastrointestinal:  Negative for abdominal distention, abdominal pain, constipation, diarrhea, nausea and vomiting.  Musculoskeletal: Negative.   Skin: Negative.   Neurological: Negative.   Psychiatric/Behavioral: Negative.     Objective:  Physical Exam Constitutional:      Appearance: She is well-developed.  HENT:     Head: Normocephalic and atraumatic.  Cardiovascular:     Rate and Rhythm: Normal rate and regular rhythm.  Pulmonary:     Effort: Pulmonary effort is normal. No respiratory distress.     Breath sounds: Normal breath sounds. No wheezing or rales.  Abdominal:     General: Bowel sounds are normal. There is no distension.     Palpations: Abdomen is soft.     Tenderness: There is no abdominal tenderness. There is no rebound.  Musculoskeletal:     Cervical back: Normal range of motion.  Skin:    General: Skin is warm and dry.  Neurological:     Mental Status: She is alert and oriented to [name], place, and time.     Coordination: Coordination normal.    Vitals:   06/11/21 1257 06/11/21 1331  BP: (!) 140/100 134/70  Pulse: 72   Resp: 18   SpO2: 98%   Weight: 167 lb 9.6 oz (76 kg)   Height: 5' 3.5" (1.613 m)     This visit occurred during the SARS-CoV-2 public health emergency.  Safety protocols were in place, including screening questions prior to the visit, additional usage of staff PPE, and extensive cleaning of exam room while observing appropriate contact time as indicated for disinfecting solutions.    Assessment & Plan:

## 2021-07-03 DIAGNOSIS — R2681 Unsteadiness on feet: Secondary | ICD-10-CM | POA: Diagnosis not present

## 2021-07-03 DIAGNOSIS — M6281 Muscle weakness (generalized): Secondary | ICD-10-CM | POA: Diagnosis not present

## 2021-07-03 DIAGNOSIS — M5451 Vertebrogenic low back pain: Secondary | ICD-10-CM | POA: Diagnosis not present

## 2021-07-08 DIAGNOSIS — M6281 Muscle weakness (generalized): Secondary | ICD-10-CM | POA: Diagnosis not present

## 2021-07-08 DIAGNOSIS — M5451 Vertebrogenic low back pain: Secondary | ICD-10-CM | POA: Diagnosis not present

## 2021-07-08 DIAGNOSIS — R2681 Unsteadiness on feet: Secondary | ICD-10-CM | POA: Diagnosis not present

## 2021-07-11 DIAGNOSIS — M6281 Muscle weakness (generalized): Secondary | ICD-10-CM | POA: Diagnosis not present

## 2021-07-11 DIAGNOSIS — M5451 Vertebrogenic low back pain: Secondary | ICD-10-CM | POA: Diagnosis not present

## 2021-07-11 DIAGNOSIS — R2681 Unsteadiness on feet: Secondary | ICD-10-CM | POA: Diagnosis not present

## 2021-07-15 DIAGNOSIS — M6281 Muscle weakness (generalized): Secondary | ICD-10-CM | POA: Diagnosis not present

## 2021-07-15 DIAGNOSIS — M5451 Vertebrogenic low back pain: Secondary | ICD-10-CM | POA: Diagnosis not present

## 2021-07-15 DIAGNOSIS — R2681 Unsteadiness on feet: Secondary | ICD-10-CM | POA: Diagnosis not present

## 2021-07-18 ENCOUNTER — Ambulatory Visit: Payer: Medicare Other | Admitting: Family Medicine

## 2021-07-18 DIAGNOSIS — M6281 Muscle weakness (generalized): Secondary | ICD-10-CM | POA: Diagnosis not present

## 2021-07-18 DIAGNOSIS — M5451 Vertebrogenic low back pain: Secondary | ICD-10-CM | POA: Diagnosis not present

## 2021-07-18 DIAGNOSIS — R2681 Unsteadiness on feet: Secondary | ICD-10-CM | POA: Diagnosis not present

## 2021-07-22 DIAGNOSIS — M5451 Vertebrogenic low back pain: Secondary | ICD-10-CM | POA: Diagnosis not present

## 2021-07-22 DIAGNOSIS — M6281 Muscle weakness (generalized): Secondary | ICD-10-CM | POA: Diagnosis not present

## 2021-07-22 DIAGNOSIS — R2681 Unsteadiness on feet: Secondary | ICD-10-CM | POA: Diagnosis not present

## 2021-07-25 DIAGNOSIS — M5451 Vertebrogenic low back pain: Secondary | ICD-10-CM | POA: Diagnosis not present

## 2021-07-25 DIAGNOSIS — R2681 Unsteadiness on feet: Secondary | ICD-10-CM | POA: Diagnosis not present

## 2021-07-25 DIAGNOSIS — M6281 Muscle weakness (generalized): Secondary | ICD-10-CM | POA: Diagnosis not present

## 2021-07-29 DIAGNOSIS — R2681 Unsteadiness on feet: Secondary | ICD-10-CM | POA: Diagnosis not present

## 2021-07-29 DIAGNOSIS — M5451 Vertebrogenic low back pain: Secondary | ICD-10-CM | POA: Diagnosis not present

## 2021-07-29 DIAGNOSIS — M6281 Muscle weakness (generalized): Secondary | ICD-10-CM | POA: Diagnosis not present

## 2021-07-31 DIAGNOSIS — M6281 Muscle weakness (generalized): Secondary | ICD-10-CM | POA: Diagnosis not present

## 2021-07-31 DIAGNOSIS — R2681 Unsteadiness on feet: Secondary | ICD-10-CM | POA: Diagnosis not present

## 2021-07-31 DIAGNOSIS — M5451 Vertebrogenic low back pain: Secondary | ICD-10-CM | POA: Diagnosis not present

## 2021-08-13 ENCOUNTER — Encounter: Payer: Self-pay | Admitting: Family Medicine

## 2021-08-13 ENCOUNTER — Telehealth: Payer: Self-pay | Admitting: Podiatry

## 2021-08-13 NOTE — Telephone Encounter (Signed)
Lvm for pt to call to schedule an appt. She requested appt online for bunion to be shaved.She is a [place] pt.

## 2021-08-15 ENCOUNTER — Other Ambulatory Visit: Payer: Self-pay

## 2021-08-15 ENCOUNTER — Ambulatory Visit (INDEPENDENT_AMBULATORY_CARE_PROVIDER_SITE_OTHER): Payer: Medicare Other

## 2021-08-15 ENCOUNTER — Encounter: Payer: Self-pay | Admitting: Podiatry

## 2021-08-15 ENCOUNTER — Ambulatory Visit: Payer: Medicare Other | Admitting: Podiatry

## 2021-08-15 DIAGNOSIS — M2011 Hallux valgus (acquired), right foot: Secondary | ICD-10-CM

## 2021-08-15 DIAGNOSIS — M19071 Primary osteoarthritis, right ankle and foot: Secondary | ICD-10-CM | POA: Diagnosis not present

## 2021-08-15 DIAGNOSIS — Z01818 Encounter for other preprocedural examination: Secondary | ICD-10-CM

## 2021-08-20 NOTE — Progress Notes (Signed)
Subjective:  Patient ID: Audrey [name], female    DOB: 1950-08-16,  MRN: [id number]  Chief Complaint  Patient presents with   Bunions    "Dr. Allena Katz said he could shave my bunion off."    71 y.o. female presents with the above complaint.  Patient presents with right first MPJ arthritis with underlying bunion deformity.  Patient states is very painful to touch painful to walk on.  Has progressive gotten worse.  She has tried all conservative treatment options including padding protecting offloading.  She states it hurts with ambulation pain 7 out of 10.  Dull achy in nature.  She would like to discuss surgical options at this time.   Review of Systems: Negative except as noted in the HPI. Denies N/V/F/Ch.  Past Medical History:  Diagnosis Date   Anxiety    Arthritis    Chicken pox    Depression    Dizziness    when bending over    Elevated liver enzymes    Fatty liver    Hyperlipidemia    Hypertension    Shingles    Wears glasses     Current Outpatient Medications:    amLODipine-valsartan (EXFORGE) 5-320 MG tablet, Take 1 tablet by mouth daily., Disp: 90 tablet, Rfl: 3   atorvastatin (LIPITOR) 20 MG tablet, Take 1 tablet (20 mg total) by mouth at bedtime., Disp: 90 tablet, Rfl: 3   desvenlafaxine (PRISTIQ) 100 MG 24 hr tablet, Take 1 tablet (100 mg total) by mouth daily., Disp: 90 tablet, Rfl: 3   methocarbamol (ROBAXIN) 500 MG tablet, Take 1 tablet (500 mg total) by mouth every 8 (eight) hours as needed for muscle spasms. (Patient not taking: Reported on 06/11/2021), Disp: 60 tablet, Rfl: 3   metoprolol succinate (TOPROL-XL) 100 MG 24 hr tablet, TAKE 1 TABLET DAILY (TAKE  WITH OR IMMEDIATELY FOLLOWING A MEAL), Disp: 90 tablet, Rfl: 3   mirtazapine (REMERON) 15 MG tablet, Take 1 Tablet at Bedtime, Disp: 90 tablet, Rfl: 3  Social History   Tobacco Use  Smoking Status Every Day   Packs/day: 1.00   Years: 50.00   Pack years: 50.00   Types: Cigarettes   Last attempt to  quit: 09/23/2015   Years since quitting: 5.9  Smokeless Tobacco Never    Allergies  Allergen Reactions   Zoloft [Sertraline Hcl] Diarrhea   Lisinopril Rash   Wellbutrin [Bupropion] Rash   Objective:  There were no vitals filed for this visit. There is no height or weight on file to calculate BMI. Constitutional Well developed. Well nourished.  Vascular Dorsalis pedis pulses palpable bilaterally. Posterior tibial pulses palpable bilaterally. Capillary refill normal to all digits.  No cyanosis or clubbing noted. Pedal hair growth normal.  Neurologic Normal speech. Oriented to person, place, and time. Epicritic sensation to light touch grossly present bilaterally.  Dermatologic Nails well groomed and normal in appearance. No open wounds. No skin lesions.  Orthopedic: Hallux rigidus noted to the right first MPJ.  Severe osteoarthritic changes with clinical appreciated osteophyte noted to the dorsum of the joint.  Pain on palpation.  No range of motion available at the first MPJ joint.  Intra-articular pain noted.   Radiographs: 3 views of skeletally mature the right foot: Severe osteoarthritic changes noted with joint space narrowing uneven with osteophytes and loose bodies.  No arthritis noted at the IPJ joint. Assessment:   1. Hallux abducto valgus, right    Plan:  Patient was evaluated and treated and all questions answered.  Right first MPJ severe osteoarthritis -I explained the patient the etiology of osteoarthritis and various treatment options were extensively discussed.  Given the amount of pain that she is clinically experiencing in the setting of failing all conservative treatment options she will benefit from surgical fusion of the right first MPJ joint.  I discussed with the patient that she will need to be nonweightbearing to the right lower extremity with a knee scooter for 3 weeks followed by weightbearing as tolerated in cam boot.  She states understanding.  I  discussed my preoperative intraoperative postoperative plan in extensive detail she states understanding and would like to proceed with surgery -Informed surgical risk consent was reviewed and read aloud to the patient.  I reviewed the films.  I have discussed my findings with the patient in great detail.  I have discussed all risks including but not limited to infection, stiffness, scarring, limp, disability, deformity, damage to blood vessels and nerves, numbness, poor healing, need for braces, arthritis, chronic pain, amputation, death.  All benefits and realistic expectations discussed in great detail.  I have made no promises as to the outcome.  I have provided realistic expectations.  I have offered the patient a 2nd opinion, which they have declined and assured me they preferred to proceed despite the risks -Cam boot was dispensed  No follow-ups on file.

## 2021-10-29 ENCOUNTER — Encounter: Payer: Medicare Other | Admitting: Podiatry

## 2021-10-31 ENCOUNTER — Ambulatory Visit (INDEPENDENT_AMBULATORY_CARE_PROVIDER_SITE_OTHER): Payer: Medicare Other

## 2021-10-31 DIAGNOSIS — Z0000 Encounter for general adult medical examination without abnormal findings: Secondary | ICD-10-CM

## 2021-10-31 NOTE — Patient Instructions (Signed)
Ms. Housh , ?Thank you for taking time to come for your Medicare Wellness Visit. I appreciate your ongoing commitment to your health goals. Please review the following plan we discussed and let me know if I can assist you in the future.  ? ?Screening recommendations/referrals: ?Colonoscopy: 03/15/2015; due every 10 years ?Mammogram: 06/01/2020; due every 1-2 years (patient will schedule for Fall 2023) ?Bone Density: 12/19/2014; due every 5 years  ?Recommended yearly ophthalmology/optometry visit for glaucoma screening and checkup ?Recommended yearly dental visit for hygiene and checkup ? ?Vaccinations: ?Influenza vaccine: 04/15/2021 ?Pneumococcal vaccine: 02/21/2017, 03/19/2018 ?Tdap vaccine: due ?Shingles vaccine: due   ?Covid-19: 07/30/2019, 08/24/2019, 03/18/2020, 04/15/2021 ? ?Advanced directives: No; DNR on file. ? ?Conditions/risks identified: Yes. ? ?Next appointment: Please schedule your next Medicare Wellness Visit with your Nurse Health Advisor in 1 year by calling [phone]. ? ? ?Preventive Care 34 Years and Older, Female ?Preventive care refers to lifestyle choices and visits with your health care provider that can promote health and wellness. ?What does preventive care include? ?A yearly physical exam. This is also called an annual well check. ?Dental exams once or twice a year. ?Routine eye exams. Ask your health care provider how often you should have your eyes checked. ?Personal lifestyle choices, including: ?Daily care of your teeth and gums. ?Regular physical activity. ?Eating a healthy diet. ?Avoiding tobacco and drug use. ?Limiting alcohol use. ?Practicing safe sex. ?Taking low-dose aspirin every day. ?Taking vitamin and mineral supplements as recommended by your health care provider. ?What happens during an annual well check? ?The services and screenings done by your health care provider during your annual well check will depend on your age, overall health, lifestyle risk factors, and family history  of disease. ?Counseling  ?Your health care provider may ask you questions about your: ?Alcohol use. ?Tobacco use. ?Drug use. ?Emotional well-being. ?Home and relationship well-being. ?Sexual activity. ?Eating habits. ?History of falls. ?Memory and ability to understand (cognition). ?Work and work Astronomer. ?Reproductive health. ?Screening  ?You may have the following tests or measurements: ?Height, weight, and BMI. ?Blood pressure. ?Lipid and cholesterol levels. These may be checked every 5 years, or more frequently if you are over 39 years old. ?Skin check. ?Lung cancer screening. You may have this screening every year starting at age 26 if you have a 30-pack-year history of smoking and currently smoke or have quit within the past 15 years. ?Fecal occult blood test (FOBT) of the stool. You may have this test every year starting at age 61. ?Flexible sigmoidoscopy or colonoscopy. You may have a sigmoidoscopy every 5 years or a colonoscopy every 10 years starting at age 57. ?Hepatitis C blood test. ?Hepatitis B blood test. ?Sexually transmitted disease (STD) testing. ?Diabetes screening. This is done by checking your blood sugar (glucose) after you have not eaten for a while (fasting). You may have this done every 1-3 years. ?Bone density scan. This is done to screen for osteoporosis. You may have this done starting at age 19. ?Mammogram. This may be done every 1-2 years. Talk to your health care provider about how often you should have regular mammograms. ?Talk with your health care provider about your test results, treatment options, and if necessary, the need for more tests. ?Vaccines  ?Your health care provider may recommend certain vaccines, such as: ?Influenza vaccine. This is recommended every year. ?Tetanus, diphtheria, and acellular pertussis (Tdap, Td) vaccine. You may need a Td booster every 10 years. ?Zoster vaccine. You may need this after age 3. ?Pneumococcal  13-valent conjugate (PCV13) vaccine. One  dose is recommended after age 35. ?Pneumococcal polysaccharide (PPSV23) vaccine. One dose is recommended after age 28. ?Talk to your health care provider about which screenings and vaccines you need and how often you need them. ?This information is not intended to replace advice given to you by your health care provider. Make sure you discuss any questions you have with your health care provider. ?Document Released: 07/06/2015 Document Revised: 02/27/2016 Document Reviewed: 04/10/2015 ?Elsevier Interactive Patient Education ? 2017 [place]. ? ?Fall Prevention in the Home ?Falls can cause injuries. They can happen to people of all ages. There are many things you can do to make your home safe and to help prevent falls. ?What can I do on the outside of my home? ?Regularly fix the edges of walkways and driveways and fix any cracks. ?Remove anything that might make you trip as you walk through a door, such as a raised step or threshold. ?Trim any bushes or trees on the path to your home. ?Use bright outdoor lighting. ?Clear any walking paths of anything that might make someone trip, such as rocks or tools. ?Regularly check to see if handrails are loose or broken. Make sure that both sides of any steps have handrails. ?Any raised decks and porches should have guardrails on the edges. ?Have any leaves, snow, or ice cleared regularly. ?Use sand or salt on walking paths during winter. ?Clean up any spills in your garage right away. This includes oil or grease spills. ?What can I do in the bathroom? ?Use night lights. ?Install grab bars by the toilet and in the tub and shower. Do not use towel bars as grab bars. ?Use non-skid mats or decals in the tub or shower. ?If you need to sit down in the shower, use a plastic, non-slip stool. ?Keep the floor dry. Clean up any water that spills on the floor as soon as it happens. ?Remove soap buildup in the tub or shower regularly. ?Attach bath mats securely with double-sided  non-slip rug tape. ?Do not have throw rugs and other things on the floor that can make you trip. ?What can I do in the bedroom? ?Use night lights. ?Make sure that you have a light by your bed that is easy to reach. ?Do not use any sheets or blankets that are too big for your bed. They should not hang down onto the floor. ?Have a firm chair that has side arms. You can use this for support while you get dressed. ?Do not have throw rugs and other things on the floor that can make you trip. ?What can I do in the kitchen? ?Clean up any spills right away. ?Avoid walking on wet floors. ?Keep items that you use a lot in easy-to-reach places. ?If you need to reach something above you, use a strong step stool that has a grab bar. ?Keep electrical cords out of the way. ?Do not use floor polish or wax that makes floors slippery. If you must use wax, use non-skid floor wax. ?Do not have throw rugs and other things on the floor that can make you trip. ?What can I do with my stairs? ?Do not leave any items on the stairs. ?Make sure that there are handrails on both sides of the stairs and use them. Fix handrails that are broken or loose. Make sure that handrails are as long as the stairways. ?Check any carpeting to make sure that it is firmly attached to the stairs. Fix any carpet  that is loose or worn. ?Avoid having throw rugs at the top or bottom of the stairs. If you do have throw rugs, attach them to the floor with carpet tape. ?Make sure that you have a light switch at the top of the stairs and the bottom of the stairs. If you do not have them, ask someone to add them for you. ?What else can I do to help prevent falls? ?Wear shoes that: ?Do not have high heels. ?Have rubber bottoms. ?Are comfortable and fit you well. ?Are closed at the toe. Do not wear sandals. ?If you use a stepladder: ?Make sure that it is fully opened. Do not climb a closed stepladder. ?Make sure that both sides of the stepladder are locked into place. ?Ask  someone to hold it for you, if possible. ?Clearly mark and make sure that you can see: ?Any grab bars or handrails. ?First and last steps. ?Where the edge of each step is. ?Use tools that help you move aro

## 2021-10-31 NOTE — Progress Notes (Signed)
?I connected with Audrey [name] today by telephone and verified that I am speaking with the correct person using two identifiers. ?Location patient: home ?Location provider: work ?Persons participating in the virtual visit: patient, provider. ?  ?I discussed the limitations, risks, security and privacy concerns of performing an evaluation and management service by telephone and the availability of in person appointments. I also discussed with the patient that there may be a patient responsible charge related to this service. The patient expressed understanding and verbally consented to this telephonic visit.  ?  ?Interactive audio and video telecommunications were attempted between this provider and patient, however failed, due to patient having technical difficulties OR patient did not have access to video capability.  We continued and completed visit with audio only. ? ?Some vital signs may be absent or patient reported.  ? ?Time Spent with patient on telephone encounter: 30 minutes ? ?Subjective:  ? Audrey [name] is a 71 y.o. female who presents for Medicare Annual (Subsequent) preventive examination. ? ?Review of Systems    ? ?Cardiac Risk Factors include: advanced age (>75men, >20 women);dyslipidemia;hypertension;family history of premature cardiovascular disease;smoking/ tobacco exposure ? ?   ?Objective:  ?  ?There were no vitals filed for this visit. ?There is no height or weight on file to calculate BMI. ? ? ?  10/31/2021  ? 11:22 AM 08/19/2016  ? 11:43 AM 08/19/2016  ?  6:42 AM 08/12/2016  ? 11:31 AM 10/16/2015  ? 10:30 AM 10/10/2015  ? 10:46 AM 03/01/2015  ?  9:57 AM  ?Advanced Directives  ?Does Patient Have a Medical Advance Directive? No No No No No No No  ?Would patient like information on creating a medical advance directive? No - Patient declined No - Patient declined No - Patient declined No - Patient declined No - patient declined information Yes - Educational materials given   ? ? ?Current  Medications (verified) ?Outpatient Encounter Medications as of 10/31/2021  ?Medication Sig  ? amLODipine-valsartan (EXFORGE) 5-320 MG tablet Take 1 tablet by mouth daily.  ? atorvastatin (LIPITOR) 20 MG tablet Take 1 tablet (20 mg total) by mouth at bedtime.  ? desvenlafaxine (PRISTIQ) 100 MG 24 hr tablet Take 1 tablet (100 mg total) by mouth daily.  ? metoprolol succinate (TOPROL-XL) 100 MG 24 hr tablet TAKE 1 TABLET DAILY (TAKE  WITH OR IMMEDIATELY FOLLOWING A MEAL)  ? mirtazapine (REMERON) 15 MG tablet Take 1 Tablet at Bedtime  ? [DISCONTINUED] methocarbamol (ROBAXIN) 500 MG tablet Take 1 tablet (500 mg total) by mouth every 8 (eight) hours as needed for muscle spasms. (Patient not taking: Reported on 06/11/2021)  ? ?No facility-administered encounter medications on file as of 10/31/2021.  ? ? ?Allergies (verified) ?Zoloft [sertraline hcl], Lisinopril, and Wellbutrin [bupropion]  ? ?History: ?Past Medical History:  ?Diagnosis Date  ? Anxiety   ? Arthritis   ? Chicken pox   ? Depression   ? Dizziness   ? when bending over   ? Elevated liver enzymes   ? Fatty liver   ? Hyperlipidemia   ? Hypertension   ? Shingles   ? Wears glasses   ? ?Past Surgical History:  ?Procedure Laterality Date  ? CESAREAN SECTION  1987, 1989  ? DILATION AND CURETTAGE OF UTERUS  1986  ? PILONIDAL CYST EXCISION  1972  ? TONSILLECTOMY  1958  ? TOTAL HIP ARTHROPLASTY Right 10/16/2015  ? Procedure: RIGHT TOTAL HIP ARTHROPLASTY ANTERIOR APPROACH;  Surgeon: Durene Romans, MD;  Location: [facility];  Service: Orthopedics;  Laterality: Right;  ? TOTAL HIP ARTHROPLASTY Left 08/19/2016  ? Procedure: LEFT TOTAL HIP ARTHROPLASTY ANTERIOR APPROACH;  Surgeon: Durene RomansMatthew Olin, MD;  Location: [facility];  Service: Orthopedics;  Laterality: Left;  requests 70 mins  ? ?Family History  ?Problem Relation Age of Onset  ? Multiple sclerosis Mother   ? Mental illness Mother   ? Arthritis Father   ? Mental illness Maternal Aunt   ? Colon cancer Maternal Aunt 70  ? Heart disease  Maternal Grandmother   ? Arthritis Paternal Grandmother   ? Hyperlipidemia Paternal Grandmother   ? Hypertension Paternal Grandmother   ? Heart disease Paternal Grandfather   ? Hypertension Paternal Grandfather   ? Mental illness Paternal Grandfather   ? Stomach cancer Neg Hx   ? Rectal cancer Neg Hx   ? Esophageal cancer Neg Hx   ? ?Social History  ? ?Socioeconomic History  ? Marital status: Married  ?  Spouse name: Not on file  ? Number of children: Not on file  ? Years of education: Not on file  ? Highest education level: Not on file  ?Occupational History  ? Not on file  ?Tobacco Use  ? Smoking status: Every Day  ?  Packs/day: 1.00  ?  Years: 50.00  ?  Pack years: 50.00  ?  Types: Cigarettes  ?  Last attempt to quit: 09/23/2015  ?  Years since quitting: 6.1  ? Smokeless tobacco: Never  ?Substance and Sexual Activity  ? Alcohol use: Yes  ?  Alcohol/week: 25.0 standard drinks  ?  Types: 15 Glasses of wine, 10 Cans of beer per week  ?  Comment: moderate--red wine and beer--daily at times   ? Drug use: Yes  ?  Types: Marijuana  ?  Comment: gummies  ? Sexual activity: Yes  ?Other Topics Concern  ? Not on file  ?Social History Narrative  ? Not on file  ? ?Social Determinants of Health  ? ?Financial Resource Strain: Low Risk   ? Difficulty of Paying Living Expenses: Not hard at all  ?Food Insecurity: No Food Insecurity  ? Worried About Programme researcher, broadcasting/film/videounning Out of Food in the Last Year: Never true  ? Ran Out of Food in the Last Year: Never true  ?Transportation Needs: No Transportation Needs  ? Lack of Transportation (Medical): No  ? Lack of Transportation (Non-Medical): No  ?Physical Activity: Sufficiently Active  ? Days of Exercise per Week: 7 days  ? Minutes of Exercise per Session: 60 min  ?Stress: No Stress Concern Present  ? Feeling of Stress : Not at all  ?Social Connections: Unknown  ? Frequency of Communication with Friends and Family: More than three times a week  ? Frequency of Social Gatherings with Friends and Family:  More than three times a week  ? Attends Religious Services: Not on file  ? Active Member of Clubs or Organizations: No  ? Attends BankerClub or Organization Meetings: Never  ? Marital Status: Married  ? ? ?Tobacco Counseling ?Ready to quit: Not Answered ?Counseling given: Not Answered ? ? ?Clinical Intake: ? ?Pre-visit preparation completed: Yes ? ?Pain : No/denies pain ? ?  ? ?Nutritional Risks: None ?Diabetes: No ? ?How often do you need to have someone help you when you read instructions, pamphlets, or other written materials from your doctor or pharmacy?: 1 - Never ?What is the last grade level you completed in school?: Masters Degree with Endorsement ? ?Diabetic? no ? ?Interpreter Needed?: No ? ?Information  entered by :: Susie Cassette, LPN. ? ? ?Activities of Daily Living ? ?  10/31/2021  ? 11:23 AM 06/11/2021  ?  1:00 PM  ?In your present state of health, do you have any difficulty performing the following activities:  ?Hearing? 0 0  ?Vision? 0 0  ?Difficulty concentrating or making decisions? 0 0  ?Walking or climbing stairs? 0 0  ?Dressing or bathing? 0 0  ?Doing errands, shopping? 0 0  ?Preparing Food and eating ? N   ?Using the Toilet? N   ?In the past six months, have you accidently leaked urine? Y   ?Do you have problems with loss of bowel control? N   ?Managing your Medications? N   ?Managing your Finances? N   ?Housekeeping or managing your Housekeeping? N   ? ? ?Patient Care Team: ?Myrlene Broker, MD as PCP - General (Internal Medicine) ?Irene Limbo., MD as Consulting Physician (Ophthalmology) ? ?Indicate any recent Medical Services you may have received from other than [facility] providers in the past year (date may be approximate). ? ?   ?Assessment:  ? This is a routine wellness examination for [facility]. ? ?Hearing/Vision screen ?Hearing Screening - Comments:: Patient denied any hearing difficulty.   ?No hearing aids. ? ?Vision Screening - Comments:: Patient does wear corrective lenses/contacts.   ?Eye exam done by: [facility] ? ? ?Dietary issues and exercise activities discussed: ?Current Exercise Habits: Home exercise routine, Type of exercise: walking;Other - see comments (goal: 10k-20k steps daily), T

## 2021-11-12 ENCOUNTER — Encounter: Payer: Medicare Other | Admitting: Podiatry

## 2022-03-17 ENCOUNTER — Other Ambulatory Visit: Payer: Self-pay | Admitting: Internal Medicine

## 2022-04-08 ENCOUNTER — Telehealth: Payer: Self-pay | Admitting: Podiatry

## 2022-04-08 NOTE — Telephone Encounter (Signed)
DOS: 05/05/2022  BCBS Medicare  Procedure: Hallux MPJ Fusion Rt (50037) DX: C48.889  Deductible: $0 Out-of-Pocket: $3,900 with $3,761.96 remaining CoInsurance: 0% Per [employer] Copay: [phone] Per [place] is Not Required.  Per Ebony P. Prior Authorization is Not Required.  Call Ref #: [id number]

## 2022-04-23 ENCOUNTER — Encounter: Payer: Self-pay | Admitting: Internal Medicine

## 2022-04-23 ENCOUNTER — Telehealth: Payer: Self-pay | Admitting: Internal Medicine

## 2022-04-23 ENCOUNTER — Ambulatory Visit (INDEPENDENT_AMBULATORY_CARE_PROVIDER_SITE_OTHER): Payer: Medicare Other | Admitting: Internal Medicine

## 2022-04-23 ENCOUNTER — Other Ambulatory Visit: Payer: Self-pay

## 2022-04-23 VITALS — BP 150/82 | HR 64 | Temp 97.3°F | Ht 63.3 in | Wt 153.0 lb

## 2022-04-23 DIAGNOSIS — I10 Essential (primary) hypertension: Secondary | ICD-10-CM

## 2022-04-23 DIAGNOSIS — E7800 Pure hypercholesterolemia, unspecified: Secondary | ICD-10-CM | POA: Diagnosis not present

## 2022-04-23 DIAGNOSIS — F419 Anxiety disorder, unspecified: Secondary | ICD-10-CM

## 2022-04-23 DIAGNOSIS — F32A Depression, unspecified: Secondary | ICD-10-CM

## 2022-04-23 DIAGNOSIS — Z0000 Encounter for general adult medical examination without abnormal findings: Secondary | ICD-10-CM

## 2022-04-23 LAB — CBC
HCT: 41.6 % (ref 36.0–46.0)
Hemoglobin: 14 g/dL (ref 12.0–15.0)
MCHC: 33.7 g/dL (ref 30.0–36.0)
MCV: 93.8 fl (ref 78.0–100.0)
Platelets: 192 10*3/uL (ref 150.0–400.0)
RBC: 4.44 Mil/uL (ref 3.87–5.11)
RDW: 13.8 % (ref 11.5–15.5)
WBC: 7.5 10*3/uL (ref 4.0–10.5)

## 2022-04-23 LAB — LIPID PANEL
Cholesterol: 151 mg/dL (ref 0–200)
HDL: 51.9 mg/dL (ref >39.00)
LDL Cholesterol: 85 mg/dL (ref 0–99)
NonHDL: 99.07
Total CHOL/HDL Ratio: 3
Triglycerides: 72 mg/dL (ref 0.0–149.0)
VLDL: 14.4 mg/dL (ref 0.0–40.0)

## 2022-04-23 LAB — COMPREHENSIVE METABOLIC PANEL
ALT: 14 U/L (ref 0–35)
AST: 16 U/L (ref 0–37)
Albumin: 4.7 g/dL (ref 3.5–5.2)
Alkaline Phosphatase: 84 U/L (ref 39–117)
BUN: 20 mg/dL (ref 6–23)
CO2: 30 mEq/L (ref 19–32)
Calcium: 9.6 mg/dL (ref 8.4–10.5)
Chloride: 102 mEq/L (ref 96–112)
Creatinine, Ser: 0.6 mg/dL (ref 0.40–1.20)
GFR: 90.87 mL/min (ref >60.00)
Glucose, Bld: 97 mg/dL (ref 70–99)
Potassium: 3.9 mEq/L (ref 3.5–5.1)
Sodium: 138 mEq/L (ref 135–145)
Total Bilirubin: 0.6 mg/dL (ref 0.2–1.2)
Total Protein: 7.2 g/dL (ref 6.0–8.3)

## 2022-04-23 MED ORDER — METOPROLOL SUCCINATE ER 100 MG PO TB24: ORAL_TABLET | ORAL | 3 refills | Status: DC

## 2022-04-23 MED ORDER — DESVENLAFAXINE SUCCINATE ER 100 MG PO TB24: 100.0000 mg | ORAL_TABLET | Freq: Every day | ORAL | 3 refills | Status: DC

## 2022-04-23 MED ORDER — MIRTAZAPINE 15 MG PO TABS: ORAL_TABLET | ORAL | 3 refills | Status: DC

## 2022-04-23 NOTE — Assessment & Plan Note (Signed)
Checking lipid panel and adjust lipitor 20 mg daily as needed. 

## 2022-04-23 NOTE — Assessment & Plan Note (Signed)
Controlled and will continue mirtazapine 15 mg qhs and pristiq 100 mg daily. Refilled both today.

## 2022-04-23 NOTE — Progress Notes (Signed)
   Subjective:   Patient ID: Audrey [name], female    DOB: 01/02/1951, 71 y.o.   MRN: [id number]  HPI The patient is here for physical/pre-op. No chest pain.  PMH, [facility], social history reviewed and updated  Review of Systems  Constitutional: Negative.   HENT: Negative.    Eyes: Negative.   Respiratory:  Negative for cough, chest tightness and shortness of breath.   Cardiovascular:  Negative for chest pain, palpitations and leg swelling.  Gastrointestinal:  Negative for abdominal distention, abdominal pain, constipation, diarrhea, nausea and vomiting.  Musculoskeletal:  Positive for arthralgias and back pain.  Skin: Negative.   Neurological: Negative.   Psychiatric/Behavioral: Negative.      Objective:  Physical Exam Constitutional:      Appearance: She is well-developed.  HENT:     Head: Normocephalic and atraumatic.  Cardiovascular:     Rate and Rhythm: Normal rate and regular rhythm.  Pulmonary:     Effort: Pulmonary effort is normal. No respiratory distress.     Breath sounds: Normal breath sounds. No wheezing or rales.  Abdominal:     General: Bowel sounds are normal. There is no distension.     Palpations: Abdomen is soft.     Tenderness: There is no abdominal tenderness. There is no rebound.  Musculoskeletal:        General: Tenderness present.     Cervical back: Normal range of motion.  Skin:    General: Skin is warm and dry.  Neurological:     Mental Status: She is alert and oriented to person, place, and time.     Coordination: Coordination normal.     Vitals:   04/23/22 1020 04/23/22 1023 04/23/22 1054  BP: (!) 180/90 (!) 160/90 (!) 150/82  Pulse: 64    Temp: (!) 97.3 F (36.3 C)    TempSrc: Oral    SpO2: 98%    Weight: 153 lb (69.4 kg)    Height: 5' 3.3" (1.608 m)     EKG: Rate 57, axis normal, interval normal, sinus brady, no st or t wave changes, no significant change compared to prior 2017   Assessment & Plan:

## 2022-04-23 NOTE — Telephone Encounter (Signed)
Pt requested that her medications that were filled today (04/23/2022) to be resent to [facility] [place], [place]: instead of the [facility].

## 2022-04-23 NOTE — Assessment & Plan Note (Signed)
Flu shot declines. Covid-19 counseled. Pneumonia complete. Shingrix declines. Tetanus declines. Colonoscopy up to date. Mammogram up to date, pap smear aged out and dexa complete. Counseled about sun safety and mole surveillance. Counseled about the dangers of distracted driving. Given 10 year screening recommendations.

## 2022-04-23 NOTE — Assessment & Plan Note (Signed)
BP at goal at home and borderline in office. She does have known white coat. Keep regimen the same with exforge 5/320 daily and metoprolol 100 mg daily. Checking CMP and adjust as needed.

## 2022-04-25 ENCOUNTER — Other Ambulatory Visit: Payer: Self-pay

## 2022-04-25 NOTE — Telephone Encounter (Signed)
Nothing controlled was sent in okay to send to alternate pharmacy.

## 2022-04-28 ENCOUNTER — Other Ambulatory Visit: Payer: Self-pay

## 2022-04-28 ENCOUNTER — Other Ambulatory Visit: Payer: Self-pay | Admitting: Internal Medicine

## 2022-04-28 DIAGNOSIS — F32A Depression, unspecified: Secondary | ICD-10-CM

## 2022-04-28 DIAGNOSIS — I10 Essential (primary) hypertension: Secondary | ICD-10-CM

## 2022-04-28 DIAGNOSIS — E7800 Pure hypercholesterolemia, unspecified: Secondary | ICD-10-CM

## 2022-04-28 MED ORDER — DESVENLAFAXINE SUCCINATE ER 100 MG PO TB24: 100.0000 mg | ORAL_TABLET | Freq: Every day | ORAL | 3 refills | Status: DC

## 2022-04-28 MED ORDER — MIRTAZAPINE 15 MG PO TABS: ORAL_TABLET | ORAL | 3 refills | Status: DC

## 2022-04-28 MED ORDER — METOPROLOL SUCCINATE ER 100 MG PO TB24: ORAL_TABLET | ORAL | 3 refills | Status: DC

## 2022-04-28 MED ORDER — AMLODIPINE BESYLATE-VALSARTAN 5-320 MG PO TABS: 1.0000 | ORAL_TABLET | Freq: Every day | ORAL | 3 refills | Status: DC

## 2022-04-28 MED ORDER — ATORVASTATIN CALCIUM 20 MG PO TABS: 20.0000 mg | ORAL_TABLET | Freq: Every day | ORAL | 3 refills | Status: DC

## 2022-04-28 NOTE — Progress Notes (Signed)
Sen in prescription for patient

## 2022-04-28 NOTE — Progress Notes (Signed)
Sent in new prescription to corrected pharmacy for pt.

## 2022-04-28 NOTE — Telephone Encounter (Signed)
Sent in new prescription to pharmacy for patient.

## 2022-04-30 ENCOUNTER — Encounter (HOSPITAL_BASED_OUTPATIENT_CLINIC_OR_DEPARTMENT_OTHER): Payer: Self-pay | Admitting: Podiatry

## 2022-04-30 ENCOUNTER — Encounter: Payer: Self-pay | Admitting: Internal Medicine

## 2022-04-30 NOTE — Progress Notes (Signed)
Spoke w/ via phone for pre-op interview--- Audrey [name] needs dos---- NONE              Lab results------ EKG current in Epic dated 04/23/22 COVID test -----patient states asymptomatic no test needed Arrive at -------0530 NPO after MN NO Solid Food.   Med rec completed Medications to take morning of surgery ----- Pristiq and Metoprolol Diabetic medication ----- Patient instructed no nail polish to be worn day of surgery Patient instructed to bring photo id and insurance card day of surgery Patient aware to have Driver (ride ) / caregiver  Husband Audrey [name]  for 24 hours after surgery  Patient Special Instructions ----- Pre-Op special Istructions ----- Patient verbalized understanding of instructions that were given at this phone interview. Patient denies shortness of breath, chest pain, fever, cough at this phone interview.

## 2022-05-01 ENCOUNTER — Telehealth: Payer: Self-pay | Admitting: Podiatry

## 2022-05-01 NOTE — Progress Notes (Signed)
Received pt's pcp H&P dated 04-30-2022 via fax from Dr Allena Katz office, placed w/ chart.

## 2022-05-01 NOTE — H&P (View-Only) (Signed)
Received pt's pcp H&P dated 04-30-2022 via fax from Dr Patel office, placed w/ chart. 

## 2022-05-01 NOTE — Telephone Encounter (Signed)
Pt called stating when she was here in [place] and was scheduled for surgery she got a bag and did not get a cam boot when she left. I asked if she was wearing a boot when she left and she said no. She does not remember ever being given a boot and needs one for the upcoming surgery. Charges were also put in for the boot that pt states she never got. Did you want to just give her another one or what would you recommend? Her Surgery is scheduled for 11.13.2023

## 2022-05-01 NOTE — Telephone Encounter (Signed)
I spoke with Audrey [name] and Dr. Allena Katz.Audrey [name] is going by the [place] office to pick up another boot for surgery. We have nothing on file that shows she was actually given the boot other than the charge and in Dr. Eliane Decree note.

## 2022-05-05 ENCOUNTER — Other Ambulatory Visit: Payer: Self-pay

## 2022-05-05 ENCOUNTER — Ambulatory Visit (HOSPITAL_BASED_OUTPATIENT_CLINIC_OR_DEPARTMENT_OTHER): Payer: Medicare Other | Admitting: Anesthesiology

## 2022-05-05 ENCOUNTER — Encounter (HOSPITAL_BASED_OUTPATIENT_CLINIC_OR_DEPARTMENT_OTHER)
Admission: RE | Disposition: A | Discharge status: Home / Self Care | Payer: Self-pay | Source: Ambulatory Visit | Attending: Podiatry

## 2022-05-05 ENCOUNTER — Ambulatory Visit (HOSPITAL_BASED_OUTPATIENT_CLINIC_OR_DEPARTMENT_OTHER): Payer: Medicare Other

## 2022-05-05 ENCOUNTER — Ambulatory Visit (HOSPITAL_COMMUNITY): Payer: Medicare Other

## 2022-05-05 ENCOUNTER — Other Ambulatory Visit: Payer: Self-pay | Admitting: Podiatry

## 2022-05-05 ENCOUNTER — Encounter (HOSPITAL_BASED_OUTPATIENT_CLINIC_OR_DEPARTMENT_OTHER): Payer: Self-pay | Admitting: Podiatry

## 2022-05-05 ENCOUNTER — Encounter: Payer: Self-pay | Admitting: Podiatry

## 2022-05-05 ENCOUNTER — Ambulatory Visit (HOSPITAL_BASED_OUTPATIENT_CLINIC_OR_DEPARTMENT_OTHER)
Admission: RE | Admit: 2022-05-05 | Discharge: 2022-05-05 | Disposition: A | Discharge status: Home / Self Care | Payer: Medicare Other | Source: Ambulatory Visit | Attending: Podiatry | Admitting: Podiatry

## 2022-05-05 DIAGNOSIS — F17210 Nicotine dependence, cigarettes, uncomplicated: Secondary | ICD-10-CM

## 2022-05-05 DIAGNOSIS — M19071 Primary osteoarthritis, right ankle and foot: Secondary | ICD-10-CM | POA: Diagnosis not present

## 2022-05-05 DIAGNOSIS — F32A Depression, unspecified: Secondary | ICD-10-CM | POA: Insufficient documentation

## 2022-05-05 DIAGNOSIS — F17200 Nicotine dependence, unspecified, uncomplicated: Secondary | ICD-10-CM | POA: Diagnosis not present

## 2022-05-05 DIAGNOSIS — Z9889 Other specified postprocedural states: Secondary | ICD-10-CM | POA: Diagnosis not present

## 2022-05-05 DIAGNOSIS — F419 Anxiety disorder, unspecified: Secondary | ICD-10-CM | POA: Insufficient documentation

## 2022-05-05 DIAGNOSIS — I10 Essential (primary) hypertension: Secondary | ICD-10-CM

## 2022-05-05 DIAGNOSIS — Z981 Arthrodesis status: Secondary | ICD-10-CM | POA: Diagnosis not present

## 2022-05-05 DIAGNOSIS — M7731 Calcaneal spur, right foot: Secondary | ICD-10-CM | POA: Diagnosis not present

## 2022-05-05 DIAGNOSIS — F418 Other specified anxiety disorders: Secondary | ICD-10-CM | POA: Diagnosis not present

## 2022-05-05 HISTORY — PX: HALLUX FUSION: SHX6621

## 2022-05-05 SURGERY — FUSION, JOINT, GREAT TOE
Anesthesia: Monitor Anesthesia Care | Site: Foot | Laterality: Right

## 2022-05-05 MED ORDER — OXYCODONE-ACETAMINOPHEN 5-325 MG PO TABS: 1.0000 | ORAL_TABLET | ORAL | 0 refills | Status: DC | PRN

## 2022-05-05 MED ORDER — ONDANSETRON HCL 4 MG/2ML IJ SOLN
INTRAMUSCULAR | Status: AC
  Filled 2022-05-05: qty 2

## 2022-05-05 MED ORDER — ACETAMINOPHEN 10 MG/ML IV SOLN: 1000.0000 mg | Freq: Once | INTRAVENOUS | Status: DC | PRN

## 2022-05-05 MED ORDER — ACETAMINOPHEN 160 MG/5ML PO SOLN: 1000.0000 mg | Freq: Once | ORAL | Status: DC | PRN

## 2022-05-05 MED ORDER — CEFAZOLIN SODIUM-DEXTROSE 2-4 GM/100ML-% IV SOLN
2.0000 g | Freq: Once | INTRAVENOUS | Status: AC
  Administered 2022-05-05: 2 g via INTRAVENOUS

## 2022-05-05 MED ORDER — OXYCODONE HCL 5 MG/5ML PO SOLN: 5.0000 mg | Freq: Once | ORAL | Status: DC | PRN

## 2022-05-05 MED ORDER — PROPOFOL 10 MG/ML IV BOLUS
INTRAVENOUS | Status: DC | PRN
  Administered 2022-05-05: 25 mg via INTRAVENOUS

## 2022-05-05 MED ORDER — FENTANYL CITRATE (PF) 100 MCG/2ML IJ SOLN
100.0000 ug | Freq: Once | INTRAMUSCULAR | Status: AC
  Administered 2022-05-05: 50 ug via INTRAVENOUS

## 2022-05-05 MED ORDER — PROPOFOL 500 MG/50ML IV EMUL
INTRAVENOUS | Status: AC
  Filled 2022-05-05: qty 50

## 2022-05-05 MED ORDER — 0.9 % SODIUM CHLORIDE (POUR BTL) OPTIME
TOPICAL | Status: DC | PRN
  Administered 2022-05-05: 800 mL

## 2022-05-05 MED ORDER — LIDOCAINE 2% (20 MG/ML) 5 ML SYRINGE
INTRAMUSCULAR | Status: DC | PRN
  Administered 2022-05-05: 60 mg via INTRAVENOUS

## 2022-05-05 MED ORDER — FENTANYL CITRATE (PF) 100 MCG/2ML IJ SOLN
INTRAMUSCULAR | Status: AC
  Filled 2022-05-05: qty 2

## 2022-05-05 MED ORDER — LACTATED RINGERS IV SOLN: INTRAVENOUS | Status: DC

## 2022-05-05 MED ORDER — EPHEDRINE SULFATE (PRESSORS) 50 MG/ML IJ SOLN
INTRAMUSCULAR | Status: DC | PRN
  Administered 2022-05-05: 10 mg via INTRAVENOUS
  Administered 2022-05-05 (×2): 5 mg via INTRAVENOUS

## 2022-05-05 MED ORDER — MIDAZOLAM HCL 2 MG/2ML IJ SOLN
INTRAMUSCULAR | Status: AC
  Filled 2022-05-05: qty 2

## 2022-05-05 MED ORDER — CLONIDINE HCL (ANALGESIA) 100 MCG/ML EP SOLN
EPIDURAL | Status: DC | PRN
  Administered 2022-05-05: 100 ug

## 2022-05-05 MED ORDER — IBUPROFEN 800 MG PO TABS: 800.0000 mg | ORAL_TABLET | Freq: Four times a day (QID) | ORAL | 1 refills | Status: DC | PRN

## 2022-05-05 MED ORDER — OXYCODONE HCL 5 MG PO TABS: 5.0000 mg | ORAL_TABLET | Freq: Once | ORAL | Status: DC | PRN

## 2022-05-05 MED ORDER — ACETAMINOPHEN 500 MG PO TABS: 1000.0000 mg | ORAL_TABLET | Freq: Once | ORAL | Status: DC | PRN

## 2022-05-05 MED ORDER — CEFAZOLIN SODIUM-DEXTROSE 2-4 GM/100ML-% IV SOLN
INTRAVENOUS | Status: AC
  Filled 2022-05-05: qty 100

## 2022-05-05 MED ORDER — BUPIVACAINE-EPINEPHRINE (PF) 0.5% -1:200000 IJ SOLN
INTRAMUSCULAR | Status: DC | PRN
  Administered 2022-05-05: 25 mL via PERINEURAL

## 2022-05-05 MED ORDER — EPHEDRINE 5 MG/ML INJ
INTRAVENOUS | Status: AC
  Filled 2022-05-05: qty 5

## 2022-05-05 MED ORDER — ONDANSETRON HCL 4 MG/2ML IJ SOLN
INTRAMUSCULAR | Status: DC | PRN
  Administered 2022-05-05: 4 mg via INTRAVENOUS

## 2022-05-05 MED ORDER — PROPOFOL 500 MG/50ML IV EMUL
INTRAVENOUS | Status: DC | PRN
  Administered 2022-05-05: 125 ug/kg/min via INTRAVENOUS

## 2022-05-05 MED ORDER — FENTANYL CITRATE (PF) 100 MCG/2ML IJ SOLN: 25.0000 ug | INTRAMUSCULAR | Status: DC | PRN

## 2022-05-05 MED ORDER — MIDAZOLAM HCL 2 MG/2ML IJ SOLN
2.0000 mg | Freq: Once | INTRAMUSCULAR | Status: AC
  Administered 2022-05-05: 2 mg via INTRAVENOUS

## 2022-05-05 MED ORDER — LIDOCAINE-EPINEPHRINE 2 %-1:100000 IJ SOLN
INTRAMUSCULAR | Status: DC | PRN
  Administered 2022-05-05: 5 mL via PERINEURAL

## 2022-05-05 MED ORDER — LIDOCAINE HCL (PF) 2 % IJ SOLN
INTRAMUSCULAR | Status: AC
  Filled 2022-05-05: qty 5

## 2022-05-05 SURGICAL SUPPLY — 65 items
APL PRP STRL LF DISP 70% ISPRP (MISCELLANEOUS) ×1
BLADE AVERAGE 25X9 (BLADE) IMPLANT
BLADE OSC/SAG .038X5.5 CUT EDG (BLADE) IMPLANT
BLADE OSCILLATING SAW SHORT (MISCELLANEOUS) IMPLANT
BLADE OSCILLATING/SAGITTAL (BLADE)
BLADE SURG 15 STRL LF DISP TIS (BLADE) ×2 IMPLANT
BLADE SURG 15 STRL SS (BLADE) ×2
BLADE SW THK.38XMED LNG THN (BLADE) ×1 IMPLANT
BNDG CMPR 9X4 STRL LF SNTH (GAUZE/BANDAGES/DRESSINGS) ×1
BNDG CMPR STD VLCR NS LF 5.8X4 (GAUZE/BANDAGES/DRESSINGS)
BNDG ELASTIC 4X5.8 VLCR NS LF (GAUZE/BANDAGES/DRESSINGS) ×1 IMPLANT
BNDG ELASTIC 4X5.8 VLCR STR LF (GAUZE/BANDAGES/DRESSINGS) ×1 IMPLANT
BNDG ESMARK 4X9 LF (GAUZE/BANDAGES/DRESSINGS) ×1 IMPLANT
BNDG GAUZE DERMACEA FLUFF 4 (GAUZE/BANDAGES/DRESSINGS) ×1 IMPLANT
BNDG GZE DERMACEA 4 6PLY (GAUZE/BANDAGES/DRESSINGS) ×1
BONE STAPLE KIT 18X18X18 (Staple) ×1 IMPLANT
CHLORAPREP W/TINT 26 (MISCELLANEOUS) ×1 IMPLANT
CLOTH BEACON ORANGE TIMEOUT ST (SAFETY) ×1 IMPLANT
COVER BACK TABLE 60X90IN (DRAPES) ×1 IMPLANT
CUFF TOURN SGL QUICK 24 (TOURNIQUET CUFF) ×1
CUFF TRNQT CYL 24X4X16.5-23 (TOURNIQUET CUFF) IMPLANT
DRAPE EXTREMITY T 121X128X90 (DISPOSABLE) ×1 IMPLANT
DRAPE IMP U-DRAPE 54X76 (DRAPES) ×1 IMPLANT
DRAPE OEC MINIVIEW 54X84 (DRAPES) ×1 IMPLANT
DRAPE U-SHAPE 47X51 STRL (DRAPES) ×1 IMPLANT
ELECT REM PT RETURN 9FT ADLT (ELECTROSURGICAL) ×1
ELECTRODE REM PT RTRN 9FT ADLT (ELECTROSURGICAL) ×1 IMPLANT
GAUZE PAD ABD 8X10 STRL (GAUZE/BANDAGES/DRESSINGS) IMPLANT
GAUZE SPONGE 4X4 12PLY STRL (GAUZE/BANDAGES/DRESSINGS) ×1 IMPLANT
GAUZE XEROFORM 1X8 LF (GAUZE/BANDAGES/DRESSINGS) ×1 IMPLANT
GLOVE BIO SURGEON STRL SZ7 (GLOVE) ×1 IMPLANT
GLOVE BIOGEL PI IND STRL 7.5 (GLOVE) ×1 IMPLANT
GLOVE SURG SS PI 6.5 STRL IVOR (GLOVE) IMPLANT
GOWN STRL REUS W/ TWL LRG LVL3 (GOWN DISPOSABLE) IMPLANT
GOWN STRL REUS W/ TWL XL LVL3 (GOWN DISPOSABLE) ×2 IMPLANT
GOWN STRL REUS W/TWL LRG LVL3 (GOWN DISPOSABLE) ×1
GOWN STRL REUS W/TWL XL LVL3 (GOWN DISPOSABLE) ×1
K-WIRE DBL END TROCAR 6X.062 (WIRE) ×1
KIT INSTRUMENT DYNAFORCE PLATE (KITS) IMPLANT
KIT STAPLE BONE HIMAX 18X18X18 (Staple) IMPLANT
KIT TURNOVER CYSTO (KITS) ×1 IMPLANT
KWIRE DBL END TROCAR 6X.062 (WIRE) IMPLANT
MANIFOLD NEPTUNE II (INSTRUMENTS) ×1 IMPLANT
NS IRRIG 500ML POUR BTL (IV SOLUTION) ×1 IMPLANT
PACK BASIN DAY SURGERY FS (CUSTOM PROCEDURE TRAY) ×1 IMPLANT
PAD CAST 4YDX4 CTTN HI CHSV (CAST SUPPLIES) ×1 IMPLANT
PAD PREP 24X48 CUFFED NSTRL (MISCELLANEOUS) ×1 IMPLANT
PADDING CAST COTTON 4X4 STRL (CAST SUPPLIES) ×1
PENCIL SMOKE EVACUATOR (MISCELLANEOUS) ×1 IMPLANT
PLATE MTP STANDARD 18 (Plate) ×1 IMPLANT
PLATE MTP STD 18 (Plate) IMPLANT
RASP SM TEAR CROSS CUT (RASP) IMPLANT
REAMER CUP/CONE DYNAFORCE 20 (MISCELLANEOUS) IMPLANT
SCREW LOCKING POLYAXIAL 3.5X16 (Screw) IMPLANT
SCREW NL MOTOBAND 3.5X16 (Screw) IMPLANT
SCREW POLYAXIL LOCK 3.5X12 (Screw) IMPLANT
STOCKINETTE 6  STRL (DRAPES) ×1
STOCKINETTE 6 STRL (DRAPES) IMPLANT
SUCTION FRAZIER HANDLE 10FR (MISCELLANEOUS) ×1
SUCTION TUBE FRAZIER 10FR DISP (MISCELLANEOUS) ×1 IMPLANT
SUT MNCRL AB 3-0 PS2 27 (SUTURE) IMPLANT
SUT MNCRL AB 4-0 PS2 18 (SUTURE) IMPLANT
SUT PROLENE 3 0 PS 2 (SUTURE) IMPLANT
SYR BULB EAR ULCER 3OZ GRN STR (SYRINGE) ×1 IMPLANT
TUBE CONNECTING 12X1/4 (SUCTIONS) ×1 IMPLANT

## 2022-05-05 NOTE — Anesthesia Procedure Notes (Signed)
Anesthesia Regional Block: Popliteal block   Pre-Anesthetic Checklist: , timeout performed,  Correct Patient, Correct Site, Correct Laterality,  Correct Procedure, Correct Position, site marked,  Risks and benefits discussed,  Surgical consent,  Pre-op evaluation,  At surgeon's request and post-op pain management  Laterality: Right and Lower  Prep: chloraprep       Needles:  Injection technique: Single-shot      Needle Length: 9cm  Needle Gauge: 22     Additional Needles: Arrow StimuQuik ECHO Echogenic Stimulating PNB Needle  Procedures:,,,, ultrasound used (permanent image in chart),,    Narrative:  Start time: 05/05/2022 7:09 AM End time: 05/05/2022 7:14 AM Injection made incrementally with aspirations every 5 mL.  Performed by: Personally  Anesthesiologist: Val Eagle, MD

## 2022-05-05 NOTE — Anesthesia Preprocedure Evaluation (Addendum)
Anesthesia Evaluation  Patient identified by MRN, date of birth, ID band Patient awake    Reviewed: Allergy & Precautions, NPO status , Patient's Chart, lab work & pertinent test results  History of Anesthesia Complications Negative for: history of anesthetic complications  Airway Mallampati: II  TM Distance: >3 FB Neck ROM: Full    Dental  (+) Dental Advisory Given, Caps, Teeth Intact   Pulmonary neg shortness of breath, neg COPD, neg recent URI, Current Smoker and Patient abstained from smoking.   breath sounds clear to auscultation       Cardiovascular hypertension, Pt. on medications and Pt. on home beta blockers (-) angina (-) Past MI and (-) CHF  Rhythm:Regular     Neuro/Psych  PSYCHIATRIC DISORDERS Anxiety Depression    negative neurological ROS     GI/Hepatic negative GI ROS, Neg liver ROS,,,  Endo/Other  negative endocrine ROS    Renal/GU negative Renal ROS     Musculoskeletal  (+) Arthritis ,    Abdominal   Peds  Hematology negative hematology ROS (+) Lab Results      Component                Value               Date                      WBC                      7.5                 04/23/2022                HGB                      14.0                04/23/2022                HCT                      41.6                04/23/2022                MCV                      93.8                04/23/2022                PLT                      192.0               04/23/2022              Anesthesia Other Findings   Reproductive/Obstetrics                             Anesthesia Physical Anesthesia Plan  ASA: 2  Anesthesia Plan: MAC and Regional   Post-op Pain Management: Regional block*   Induction: Intravenous  PONV Risk Score and Plan: 1 and Propofol infusion and Treatment may vary due to age or medical condition  Airway Management Planned: Nasal Cannula and Natural  Airway  Additional  Equipment: None  Intra-op Plan:   Post-operative Plan:   Informed Consent: I have reviewed the patients History and Physical, chart, labs and discussed the procedure including the risks, benefits and alternatives for the proposed anesthesia with the patient or authorized representative who has indicated his/her understanding and acceptance.     Dental advisory given  Plan Discussed with: CRNA  Anesthesia Plan Comments:        Anesthesia Quick Evaluation

## 2022-05-05 NOTE — Anesthesia Procedure Notes (Signed)
Procedure Name: MAC Date/Time: 05/05/2022 7:30 AM  Performed by: Bonney Aid, CRNAPre-anesthesia Checklist: Patient identified, Emergency Drugs available, Suction available, Timeout performed and Patient being monitored Patient Re-evaluated:Patient Re-evaluated prior to induction Oxygen Delivery Method: Nasal cannula Placement Confirmation: positive ETCO2

## 2022-05-05 NOTE — Progress Notes (Signed)
Assisted Dr. Maple Hudson with right, popliteal, ultrasound guided block. Side rails up, monitors on throughout procedure. See vital signs in flow sheet. Tolerated Procedure well.

## 2022-05-05 NOTE — Anesthesia Postprocedure Evaluation (Signed)
Anesthesia Post Note  Patient: Audrey [name]  Procedure(s) Performed: [place] Getting, FIRST METATASALPHALANGEAL JOINT (Right: Foot)     Anesthesia Type: Regional Anesthetic complications: no   No notable events documented.  Last Vitals:  Vitals:   05/05/22 0711 05/05/22 0715  BP: (!) 175/81 130/62  Pulse: (!) 57 (!) 55  Resp: 16 15  Temp:    SpO2: 100% 99%    Last Pain:  Vitals:   05/05/22 0544  TempSrc: Oral                 Eadie Repetto

## 2022-05-05 NOTE — Discharge Instructions (Addendum)
After Surgery Instructions   1) If you are recuperating from surgery anywhere other than home, please be sure to leave [place] the number where you can be reached.  2) Go directly home and rest.  3) Keep the operated foot(feet) elevated six inches above the hip when sitting or lying down. This will help control swelling and pain.  4) Support the elevated foot and leg with pillows. DO NOT PLACE PILLOWS UNDER THE KNEE.  5) DO NOT REMOVE or get your bandages WET, unless you were given different instructions by your doctor to do so. This increases the risk of infection.  6) Wear your surgical shoe or surgical boot at all times when you are up on your feet.  7) A limited amount of pain and swelling may occur. The skin may take on a bruised appearance. DO NOT BE ALARMED, THIS IS NORMAL.  8) For slight pain and swelling, apply an ice pack directly over the bandages for 15 minutes only out of each hour of the day. Continue until seen in the office for your first post op visit. DON NOT     APPLY ANY FORM OF HEAT TO THE AREA.  9) Have prescriptions filled immediately and take as directed.  10) Drink lots of liquids, water and juice to stay hydrated.  11) CALL IMMEDIATELY IF:  *Bleeding continues until the following day of surgery  *Pain increases and/or does not respond to medication  *Bandages or cast appears to tight  *If your bandage gets wet  *Trip, fall or stump your surgical foot  *If your temperature goes above 101  *If you have ANY questions at all  [place]. ADHERING TO THESE INSTRUCTIONS WILL OFFER YOU THE MOST COMPLETE RESULTS   **Per Dr. Posey Pronto, do not bear weight on operative foot until further instructed.    Post Anesthesia Home Care Instructions  Activity: Get plenty of rest for the remainder of the day. A responsible individual must stay with you for 24 hours following the procedure.  For the next 24 hours, DO NOT: -Drive a car -Conservation officer, nature -Drink alcoholic beverages -Take any medication unless instructed by your physician -Make any legal decisions or sign important papers.  Meals: Start with liquid foods such as gelatin or soup. Progress to regular foods as tolerated. Avoid greasy, spicy, heavy foods. If nausea and/or vomiting occur, drink only clear liquids until the nausea and/or vomiting subsides. Call your physician if vomiting continues.  Special Instructions/Symptoms: Your throat may feel dry or sore from the anesthesia or the breathing tube placed in your throat during surgery. If this causes discomfort, gargle with warm salt water. The discomfort should disappear within 24 hours.      Regional Anesthesia Blocks  1. Numbness or the inability to move the "blocked" extremity may last from 3-48 hours after placement. The length of time depends on the medication injected and your individual response to the medication. If the numbness is not going away after 48 hours, call your surgeon.  2. The extremity that is blocked will need to be protected until the numbness is gone and the  Strength has returned. Because you cannot feel it, you will need to take extra care to avoid injury. Because it may be weak, you may have difficulty moving it or using it. You may not know what position it is in without looking at it while the block is in effect.  3. For blocks in the legs and  feet, returning to weight bearing and walking needs to be done carefully. You will need to wait until the numbness is entirely gone and the strength has returned. You should be able to move your leg and foot normally before you try and bear weight or walk. You will need someone to be with you when you first try to ensure you do not fall and possibly risk injury.  4. Bruising and tenderness at the needle site are common side effects and will resolve in a few days.  5. Persistent numbness or new problems with movement should be communicated to the  surgeon.

## 2022-05-05 NOTE — Interval H&P Note (Signed)
History and Physical Interval Note:  05/05/2022 7:13 AM  Audrey [name]  has presented today for surgery, with the diagnosis of ARTHRITIS OF RIGHT FOOT.  The various methods of treatment have been discussed with the patient and family. After consideration of risks, benefits and other options for treatment, the patient has consented to  Procedure(s) with comments: HALLUX FUSION, FIRST METATASALPHALANGEAL JOINT (Right) - CHOICE WITH BLOCK as a surgical intervention.  The patient's history has been reviewed, patient examined, no change in status, stable for surgery.  I have reviewed the patient's chart and labs.  Questions were answered to the patient's satisfaction.     Candelaria Stagers

## 2022-05-05 NOTE — Anesthesia Postprocedure Evaluation (Signed)
Anesthesia Post Note  Patient: Audrey [name]  Procedure(s) Performed: Atlee Abide, FIRST METATASALPHALANGEAL JOINT (Right: Foot)     Patient location during evaluation: PACU Anesthesia Type: Regional and MAC Level of consciousness: awake and alert Pain management: pain level controlled Vital Signs Assessment: post-procedure vital signs reviewed and stable Respiratory status: spontaneous breathing, nonlabored ventilation and respiratory function stable Cardiovascular status: stable and blood pressure returned to baseline Postop Assessment: no apparent nausea or vomiting Anesthetic complications: no   No notable events documented.  Last Vitals:  Vitals:   05/05/22 0715 05/05/22 0845  BP: 130/62 (!) 148/67  Pulse: (!) 55 (!) 57  Resp: 15 15  Temp:  (!) 36.4 C  SpO2: 99% 95%    Last Pain:  Vitals:   05/05/22 0845  TempSrc:   PainSc: 0-No pain                 Aracelly Tencza

## 2022-05-05 NOTE — Op Note (Signed)
Surgeon: Surgeon(s): Candelaria Stagers, DPM  Assistants: None Pre-operative diagnosis: ARTHRITIS OF RIGHT FOOT  Post-operative diagnosis: same Procedure: Procedure(s) (LRB): HALLUX FUSION, FIRST METATASALPHALANGEAL JOINT (Right)  Pathology: * No specimens in log *  Pertinent Intra-op findings: *Significant osteoarthritis noted to the right first metatarsophalangeal joint Anesthesia: Choice  Hemostasis:  Total Tourniquet Time Documented: Calf (Right) - 45 minutes Total: Calf (Right) - 45 minutes  EBL: Minimal 50 cc Materials: 3-0 Prolene, 4-0 Monocryl, 3-0 Monocryl, Crossroads plates and screw with the stable Injectables: None Complications: None  Indications for surgery: A 71 y.o. female presents with right painful first metatarsophalangeal joint secondary to arthritis. Patient has failed all conservative therapy including but not limited to local wound care injection. She wishes to have surgical correction of the foot/deformity. It was determined that patient would benefit from right first metatarsophalangeal joint fusion/arthrodesis. Informed surgical risk consent was reviewed and read aloud to the patient.  I reviewed the films.  I have discussed my findings with the patient in great detail.  I have discussed all risks including but not limited to infection, stiffness, scarring, limp, disability, deformity, damage to blood vessels and nerves, numbness, poor healing, need for braces, arthritis, chronic pain, amputation, death.  All benefits and realistic expectations discussed in great detail.  I have made no promises as to the outcome.  I have provided realistic expectations.  I have offered the patient a 2nd opinion, which they have declined and assured me they preferred to proceed despite the risks   Procedure in detail: The patient was both verbally and visually identified by myself, the nursing staff, and anesthesia staff in the preoperative holding area. They were then transferred to  the operating room and placed on the operative table in supine position.   Attention was directed to the dorsal aspect of the Right first metatarsophalangeal joint where a linear skin incision approximately 6cm long was made in the skin using a #15 blade. This incision was carried down through the subcutaneous tissue taking care to clamp and cauterize all neurovascular structures as necessary.  The capsule of 1st MPJ was identified. A linear capsulotomy was then performed in-line with the original skin incision and the capsule was reflected both medially and laterally to expose the head of the first metatarsal and the base of the proximal phalanx. The articular surface of the 1st metatarsal head was noted to show evidence of []  degeneration under direct visualization. The sagittal saw was then used to resect the dorsal, medial, and lateral prominences off of the 1st metatarsal. A rongeur was utilized to remove the dorsal exostosis of the proximal phalanx. A curette was used to remove some of the cartilage, and the remaining cartilage was removed using a burr until punctate bleeding could be noted on the metatarsal head and base of proximal phalanx. The site as flushed with copious amounts of sterile saline.  The distal aspect of the 1st metatarsal and the base of the proximal phalanx were then subchondrally drilled utilizing a pineapple burr. A Temporary K wire was then placed from distal-medial to proximal-lateral across the 1st MPJ, and the position was confirmed to be satisfactory utilizing fluoroscopy.. A dorsal right1st MPJ fusion locking plate was then applied and secured with two olive wires.  A crossroads staple was utilized through the dorsal plate to hold the fixation.The position was confirmed to be satisfactory with fluoroscopy, and two screws were placed both proximally and distally for a total of four Nonlocking/locking screws utilized. The 1st MPJ  was then stressed intraoperatively and no motion or  gapping was noted across the fusion site. Final imaging via fluoroscopy was taken to confirm adequate placement and rectus position of the hallux. The surgical site was copiously irrigated with sterile saline. The capsule was then reapproximated with 3-0 Monocortical in a simple interrupted fashion. The subcutaneous tissue was then reapproximated with 4-0 monocryl in a running fashion. The subcuticular was performed utilizing 4-0 Monocryl. All bony prominences were adequately padded.  Tourniquet was deflated.  At the conclusion of the procedure the patient was awoken from anesthesia and found to have tolerated the procedure well any complications. There were transferred to PACU with vital signs stable and vascular status intact.  Boneta Lucks, DPM

## 2022-05-05 NOTE — Transfer of Care (Signed)
Immediate Anesthesia Transfer of Care Note  Patient: Audrey [name]  Procedure(s) Performed: Atlee Abide, FIRST METATASALPHALANGEAL JOINT (Right: Foot)  Patient Location: Short Stay  Anesthesia Type:MAC  Level of Consciousness: awake, alert , and oriented  Airway & Oxygen Therapy: Patient Spontanous Breathing  Post-op Assessment: Report given to RN  Post vital signs: Reviewed and stable  Last Vitals:  Vitals Value Taken Time  BP 148/67   Temp    Pulse 57 05/05/22 0846  Resp    SpO2 95 % 05/05/22 0846  Vitals shown include unvalidated device data.  Last Pain:  Vitals:   05/05/22 0544  TempSrc: Oral         Complications: No notable events documented.

## 2022-05-06 ENCOUNTER — Encounter (HOSPITAL_BASED_OUTPATIENT_CLINIC_OR_DEPARTMENT_OTHER): Payer: Self-pay | Admitting: Podiatry

## 2022-05-13 ENCOUNTER — Ambulatory Visit (INDEPENDENT_AMBULATORY_CARE_PROVIDER_SITE_OTHER): Payer: Medicare Other

## 2022-05-13 ENCOUNTER — Encounter: Payer: Self-pay | Admitting: Podiatry

## 2022-05-13 ENCOUNTER — Ambulatory Visit (INDEPENDENT_AMBULATORY_CARE_PROVIDER_SITE_OTHER): Payer: Medicare Other | Admitting: Podiatry

## 2022-05-13 DIAGNOSIS — M19071 Primary osteoarthritis, right ankle and foot: Secondary | ICD-10-CM | POA: Diagnosis not present

## 2022-05-13 DIAGNOSIS — Z9889 Other specified postprocedural states: Secondary | ICD-10-CM

## 2022-05-13 NOTE — Progress Notes (Signed)
  Subjective:  Patient ID: Audrey [name], female    DOB: 1950/08/15,  MRN: [id number]  No chief complaint on file.   DOS: 05/05/2022 Procedure: Right first MPJ fusion  71 y.o. female returns for post-op check.  Patient states that she is doing great.  She states that her is very minimal pain.  She has been nonweightbearing to the right lower extremity with a knee scooter.  Review of Systems: Negative except as noted in the HPI. Denies N/V/F/Ch.  Past Medical History:  Diagnosis Date   Anxiety    Arthritis    Chicken pox    Depression    Dizziness    when bending over    Elevated liver enzymes    Fatty liver    Hyperlipidemia    Hypertension    Shingles    Wears glasses     Current Outpatient Medications:    ibuprofen (ADVIL) 800 MG tablet, Take 1 tablet (800 mg total) by mouth every 6 (six) hours as needed., Disp: 60 tablet, Rfl: 1   oxyCODONE-acetaminophen (PERCOCET) 5-325 MG tablet, Take 1 tablet by mouth every 4 (four) hours as needed for severe pain., Disp: 30 tablet, Rfl: 0   amLODipine-valsartan (EXFORGE) 5-320 MG tablet, Take 1 tablet by mouth daily., Disp: 90 tablet, Rfl: 3   atorvastatin (LIPITOR) 20 MG tablet, Take 1 tablet (20 mg total) by mouth at bedtime., Disp: 90 tablet, Rfl: 3   desvenlafaxine (PRISTIQ) 100 MG 24 hr tablet, Take 1 tablet (100 mg total) by mouth daily., Disp: 90 tablet, Rfl: 3   metoprolol succinate (TOPROL-XL) 100 MG 24 hr tablet, TAKE 1 TABLET DAILY (TAKE  WITH OR IMMEDIATELY FOLLOWING A MEAL), Disp: 90 tablet, Rfl: 3   mirtazapine (REMERON) 15 MG tablet, Take 1 Tablet at Bedtime, Disp: 90 tablet, Rfl: 3  Social History   Tobacco Use  Smoking Status Every Day   Packs/day: 1.00   Years: 50.00   Total pack years: 50.00   Types: Cigarettes   Last attempt to quit: 09/23/2015   Years since quitting: 6.6  Smokeless Tobacco Never    Allergies  Allergen Reactions   Zoloft [Sertraline Hcl] Diarrhea   Lisinopril Rash   Wellbutrin  [Bupropion] Rash   Objective:  There were no vitals filed for this visit. There is no height or weight on file to calculate BMI. Constitutional Well developed. Well nourished.  Vascular Foot warm and well perfused. Capillary refill normal to all digits.   Neurologic Normal speech. Oriented to [name], place, and time. Epicritic sensation to light touch grossly present bilaterally.  Dermatologic Skin healing well without signs of infection. Skin edges well coapted without signs of infection.  Orthopedic: Tenderness to palpation noted about the surgical site.   Radiographs: 3 views of skeletally mature right foot: Good correction alignment noted.  Hardware is intact.  No signs of backing or loosening noted. Assessment:   1. Osteoarthritis of first metatarsophalangeal (MTP) joint of right foot   2. Status post foot surgery    Plan:  Patient was evaluated and treated and all questions answered.  S/p foot surgery right -Progressing as expected post-operatively. -XR: See above -WB Status: Nonweightbearing to the right lower extremity and cam boot and knee scooter -Sutures: Intact.  No signs of dehiscence noted no complication noted. -Medications: None -Foot redressed.  No follow-ups on file.

## 2022-05-20 ENCOUNTER — Ambulatory Visit: Payer: Medicare Other | Admitting: Internal Medicine

## 2022-05-27 ENCOUNTER — Ambulatory Visit (INDEPENDENT_AMBULATORY_CARE_PROVIDER_SITE_OTHER): Payer: Medicare Other | Admitting: Podiatry

## 2022-05-27 DIAGNOSIS — M19071 Primary osteoarthritis, right ankle and foot: Secondary | ICD-10-CM

## 2022-05-27 DIAGNOSIS — Z9889 Other specified postprocedural states: Secondary | ICD-10-CM

## 2022-05-27 NOTE — Progress Notes (Signed)
  Subjective:  Patient ID: Audrey [name], female    DOB: 12/21/1950,  MRN: [id number]  Chief Complaint  Patient presents with   Routine Post Op    DOS: 05/05/2022 Procedure: Right first MPJ fusion  71 y.o. female returns for post-op check.  Patient states that she is doing great.  She states that her is very minimal pain.  She has been nonweightbearing to the right lower extremity with a knee scooter.  Review of Systems: Negative except as noted in the HPI. Denies N/V/F/Ch.  Past Medical History:  Diagnosis Date   Anxiety    Arthritis    Chicken pox    Depression    Dizziness    when bending over    Elevated liver enzymes    Fatty liver    Hyperlipidemia    Hypertension    Shingles    Wears glasses     Current Outpatient Medications:    ibuprofen (ADVIL) 800 MG tablet, Take 1 tablet (800 mg total) by mouth every 6 (six) hours as needed., Disp: 60 tablet, Rfl: 1   oxyCODONE-acetaminophen (PERCOCET) 5-325 MG tablet, Take 1 tablet by mouth every 4 (four) hours as needed for severe pain., Disp: 30 tablet, Rfl: 0   amLODipine-valsartan (EXFORGE) 5-320 MG tablet, Take 1 tablet by mouth daily., Disp: 90 tablet, Rfl: 3   atorvastatin (LIPITOR) 20 MG tablet, Take 1 tablet (20 mg total) by mouth at bedtime., Disp: 90 tablet, Rfl: 3   desvenlafaxine (PRISTIQ) 100 MG 24 hr tablet, Take 1 tablet (100 mg total) by mouth daily., Disp: 90 tablet, Rfl: 3   metoprolol succinate (TOPROL-XL) 100 MG 24 hr tablet, TAKE 1 TABLET DAILY (TAKE  WITH OR IMMEDIATELY FOLLOWING A MEAL), Disp: 90 tablet, Rfl: 3   mirtazapine (REMERON) 15 MG tablet, Take 1 Tablet at Bedtime, Disp: 90 tablet, Rfl: 3  Social History   Tobacco Use  Smoking Status Every Day   Packs/day: 1.00   Years: 50.00   Total pack years: 50.00   Types: Cigarettes   Last attempt to quit: 09/23/2015   Years since quitting: 6.6  Smokeless Tobacco Never    Allergies  Allergen Reactions   Zoloft [Sertraline Hcl] Diarrhea    Lisinopril Rash   Wellbutrin [Bupropion] Rash   Objective:  There were no vitals filed for this visit. There is no height or weight on file to calculate BMI. Constitutional Well developed. Well nourished.  Vascular Foot warm and well perfused. Capillary refill normal to all digits.   Neurologic Normal speech. Oriented to [name], place, and time. Epicritic sensation to light touch grossly present bilaterally.  Dermatologic Skin mostly reepithelialized.  Superficial dehiscence noted for which Betadine wet-to-dry gauze dressings were discussed.  No purulent drainage noted no exposure of hardware noted good correction alignment noted  Orthopedic: Tenderness to palpation noted about the surgical site.   Radiographs: 3 views of skeletally mature right foot: Good correction alignment noted.  Hardware is intact.  No signs of backing or loosening noted. Assessment:   No diagnosis found.  Plan:  Patient was evaluated and treated and all questions answered.  S/p foot surgery right -Progressing as expected post-operatively. -XR: See above -WB Status: Begin weightbearing as tolerated in cam boot -Sutures: Removed no signs of dehiscence noted no complication noted. -Medications: None -Foot redressed.  No follow-ups on file.

## 2022-05-29 DIAGNOSIS — H25813 Combined forms of age-related cataract, bilateral: Secondary | ICD-10-CM | POA: Diagnosis not present

## 2022-05-29 DIAGNOSIS — H524 Presbyopia: Secondary | ICD-10-CM | POA: Diagnosis not present

## 2022-06-11 ENCOUNTER — Encounter: Payer: Self-pay | Admitting: Internal Medicine

## 2022-06-11 DIAGNOSIS — Z1231 Encounter for screening mammogram for malignant neoplasm of breast: Secondary | ICD-10-CM

## 2022-06-19 ENCOUNTER — Ambulatory Visit (INDEPENDENT_AMBULATORY_CARE_PROVIDER_SITE_OTHER): Payer: Medicare Other

## 2022-06-19 ENCOUNTER — Ambulatory Visit (INDEPENDENT_AMBULATORY_CARE_PROVIDER_SITE_OTHER): Payer: Medicare Other | Admitting: Podiatry

## 2022-06-19 DIAGNOSIS — Z9889 Other specified postprocedural states: Secondary | ICD-10-CM | POA: Diagnosis not present

## 2022-06-19 DIAGNOSIS — M19071 Primary osteoarthritis, right ankle and foot: Secondary | ICD-10-CM

## 2022-06-19 NOTE — Progress Notes (Signed)
Subjective:  Patient ID: Audrey [name], female    DOB: 03-29-1951,  MRN: [id number]  Chief Complaint  Patient presents with   Post-op Follow-up    Post op visit DOS 05/05/2022 RT 1ST MPJ FUSION    DOS: 05/05/2022 Procedure: Right first MPJ fusion  71 y.o. female returns for post-op check.  Patient states that she is doing great.  She does not have any pain.  She states that there has not been any oozing.  Bandages clean dry and intact has been doing Betadine wet-to-dry dressing  Review of Systems: Negative except as noted in the HPI. Denies N/V/F/Ch.  Past Medical History:  Diagnosis Date   Anxiety    Arthritis    Chicken pox    Depression    Dizziness    when bending over    Elevated liver enzymes    Fatty liver    Hyperlipidemia    Hypertension    Shingles    Wears glasses     Current Outpatient Medications:    amLODipine-valsartan (EXFORGE) 5-320 MG tablet, Take 1 tablet by mouth daily., Disp: 90 tablet, Rfl: 3   atorvastatin (LIPITOR) 20 MG tablet, Take 1 tablet (20 mg total) by mouth at bedtime., Disp: 90 tablet, Rfl: 3   desvenlafaxine (PRISTIQ) 100 MG 24 hr tablet, Take 1 tablet (100 mg total) by mouth daily., Disp: 90 tablet, Rfl: 3   ibuprofen (ADVIL) 800 MG tablet, Take 1 tablet (800 mg total) by mouth every 6 (six) hours as needed., Disp: 60 tablet, Rfl: 1   metoprolol succinate (TOPROL-XL) 100 MG 24 hr tablet, TAKE 1 TABLET DAILY (TAKE  WITH OR IMMEDIATELY FOLLOWING A MEAL), Disp: 90 tablet, Rfl: 3   mirtazapine (REMERON) 15 MG tablet, Take 1 Tablet at Bedtime, Disp: 90 tablet, Rfl: 3   oxyCODONE-acetaminophen (PERCOCET) 5-325 MG tablet, Take 1 tablet by mouth every 4 (four) hours as needed for severe pain., Disp: 30 tablet, Rfl: 0  Social History   Tobacco Use  Smoking Status Every Day   Packs/day: 1.00   Years: 50.00   Total pack years: 50.00   Types: Cigarettes   Last attempt to quit: 09/23/2015   Years since quitting: 6.7  Smokeless Tobacco Never     Allergies  Allergen Reactions   Zoloft [Sertraline Hcl] Diarrhea   Lisinopril Rash   Wellbutrin [Bupropion] Rash   Objective:  There were no vitals filed for this visit. There is no height or weight on file to calculate BMI. Constitutional Well developed. Well nourished.  Vascular Foot warm and well perfused. Capillary refill normal to all digits.   Neurologic Normal speech. Oriented to [name], place, and time. Epicritic sensation to light touch grossly present bilaterally.  Dermatologic Skin mostly reepithelialized.  Superficial dehiscence noted for which Betadine wet-to-dry gauze dressings were discussed.  No purulent drainage noted no exposure of hardware noted good correction alignment noted  Orthopedic: Tenderness to palpation noted about the surgical site.   Radiographs: 3 views of skeletally mature right foot: Good correction alignment noted.  Hardware is intact.  No signs of backing or loosening noted. Assessment:   1. Status post foot surgery     Plan:  Patient was evaluated and treated and all questions answered.  S/p foot surgery right -Progressing as expected post-operatively. -XR: See above -WB Status: Weightbearing as tolerated in surgical shoe -Sutures: Removed no signs of dehiscence noted no complication noted. -Medications: None -Continue Betadine wet-to-dry dressing as patient still has superficial dehiscence.  No follow-ups  on file.

## 2022-06-27 ENCOUNTER — Encounter: Payer: Self-pay | Admitting: Internal Medicine

## 2022-06-27 ENCOUNTER — Ambulatory Visit (INDEPENDENT_AMBULATORY_CARE_PROVIDER_SITE_OTHER): Payer: Medicare Other | Admitting: Internal Medicine

## 2022-06-27 VITALS — BP 160/80 | HR 62 | Temp 98.3°F | Ht 63.0 in | Wt 161.0 lb

## 2022-06-27 DIAGNOSIS — N6452 Nipple discharge: Secondary | ICD-10-CM | POA: Insufficient documentation

## 2022-06-27 NOTE — Assessment & Plan Note (Signed)
Ordered diagnostic mammogram to assess nipple drainage which has since resolved. Overdue for mammogram last 2021

## 2022-06-27 NOTE — Patient Instructions (Signed)
We will get the mammogram done to check the breasts.

## 2022-06-27 NOTE — Progress Notes (Signed)
   Subjective:   Patient ID: Audrey [name], female    DOB: July 30, 1950, 72 y.o.   MRN: [id number]  HPI The patient is 72 YO female coming in for right nipple discharge.  Review of Systems  Constitutional: Negative.   HENT: Negative.    Eyes: Negative.   Respiratory:  Negative for cough, chest tightness and shortness of breath.   Cardiovascular:  Negative for chest pain, palpitations and leg swelling.  Gastrointestinal:  Negative for abdominal distention, abdominal pain, constipation, diarrhea, nausea and vomiting.  Genitourinary:        Nipple discharge  Musculoskeletal: Negative.   Skin: Negative.   Neurological: Negative.   Psychiatric/Behavioral: Negative.      Objective:  Physical Exam Constitutional:      Appearance: She is well-developed.  HENT:     Head: Normocephalic and atraumatic.  Cardiovascular:     Rate and Rhythm: Normal rate and regular rhythm.  Pulmonary:     Effort: Pulmonary effort is normal. No respiratory distress.     Breath sounds: Normal breath sounds. No wheezing or rales.  Abdominal:     General: Bowel sounds are normal. There is no distension.     Palpations: Abdomen is soft.     Tenderness: There is no abdominal tenderness. There is no rebound.  Musculoskeletal:     Cervical back: Normal range of motion.  Skin:    General: Skin is warm and dry.  Neurological:     Mental Status: She is alert and oriented to person, place, and time.     Coordination: Coordination normal.     Vitals:   06/27/22 0833 06/27/22 0840  BP: (!) 160/80 (!) 160/80  Pulse: 62   Temp: 98.3 F (36.8 C)   TempSrc: Oral   SpO2: 98%   Weight: 161 lb (73 kg)   Height: 5\' 3"  (1.6 m)     Assessment & Plan:

## 2022-07-02 ENCOUNTER — Encounter: Payer: Self-pay | Admitting: Internal Medicine

## 2022-07-04 ENCOUNTER — Other Ambulatory Visit: Payer: Self-pay | Admitting: Internal Medicine

## 2022-07-04 ENCOUNTER — Encounter: Payer: Self-pay | Admitting: Internal Medicine

## 2022-07-04 DIAGNOSIS — N6452 Nipple discharge: Secondary | ICD-10-CM

## 2022-07-07 ENCOUNTER — Ambulatory Visit
Admission: RE | Admit: 2022-07-07 | Discharge: 2022-07-07 | Disposition: A | Discharge status: Home / Self Care | Payer: Medicare Other | Source: Ambulatory Visit | Attending: Internal Medicine | Admitting: Internal Medicine

## 2022-07-07 DIAGNOSIS — N6452 Nipple discharge: Secondary | ICD-10-CM | POA: Insufficient documentation

## 2022-07-07 DIAGNOSIS — N6489 Other specified disorders of breast: Secondary | ICD-10-CM | POA: Diagnosis not present

## 2022-07-07 DIAGNOSIS — R928 Other abnormal and inconclusive findings on diagnostic imaging of breast: Secondary | ICD-10-CM | POA: Diagnosis not present

## 2022-07-08 ENCOUNTER — Other Ambulatory Visit: Payer: Self-pay | Admitting: Internal Medicine

## 2022-07-08 ENCOUNTER — Other Ambulatory Visit: Payer: Medicare Other

## 2022-07-08 DIAGNOSIS — F419 Anxiety disorder, unspecified: Secondary | ICD-10-CM

## 2022-07-10 ENCOUNTER — Other Ambulatory Visit: Payer: Self-pay

## 2022-07-10 DIAGNOSIS — F32A Depression, unspecified: Secondary | ICD-10-CM

## 2022-07-10 MED ORDER — DESVENLAFAXINE SUCCINATE ER 100 MG PO TB24: 100.0000 mg | ORAL_TABLET | Freq: Every day | ORAL | 3 refills | Status: DC

## 2022-07-11 ENCOUNTER — Encounter: Payer: Self-pay | Admitting: Podiatry

## 2022-07-11 NOTE — Telephone Encounter (Signed)
Please advise 

## 2022-07-15 ENCOUNTER — Other Ambulatory Visit: Payer: Medicare Other

## 2022-07-22 ENCOUNTER — Ambulatory Visit (INDEPENDENT_AMBULATORY_CARE_PROVIDER_SITE_OTHER): Payer: Medicare Other | Admitting: Podiatry

## 2022-07-22 DIAGNOSIS — Z9889 Other specified postprocedural states: Secondary | ICD-10-CM

## 2022-07-22 NOTE — Progress Notes (Signed)
  Subjective:  Patient ID: Audrey [name], female    DOB: 11-Feb-1951,  MRN: [id number]  Chief Complaint  Patient presents with   Routine Post Op    DOS: 05/05/2022 Procedure: Right first MPJ fusion  72 y.o. female returns for post-op check.  Patient states that she is doing great.  She does not have any pain.  She states that there has not been any oozing.  Bandages clean dry and intact has been doing Betadine wet-to-dry dressing  Review of Systems: Negative except as noted in the HPI. Denies N/V/F/Ch.  Past Medical History:  Diagnosis Date   Anxiety    Arthritis    Chicken pox    Depression    Dizziness    when bending over    Elevated liver enzymes    Fatty liver    Hyperlipidemia    Hypertension    Shingles    Wears glasses     Current Outpatient Medications:    amLODipine-valsartan (EXFORGE) 5-320 MG tablet, Take 1 tablet by mouth daily., Disp: 90 tablet, Rfl: 3   atorvastatin (LIPITOR) 20 MG tablet, Take 1 tablet (20 mg total) by mouth at bedtime., Disp: 90 tablet, Rfl: 3   desvenlafaxine (PRISTIQ) 100 MG 24 hr tablet, Take 1 tablet (100 mg total) by mouth daily., Disp: 90 tablet, Rfl: 3   ibuprofen (ADVIL) 800 MG tablet, Take 1 tablet (800 mg total) by mouth every 6 (six) hours as needed., Disp: 60 tablet, Rfl: 1   metoprolol succinate (TOPROL-XL) 100 MG 24 hr tablet, TAKE 1 TABLET DAILY (TAKE  WITH OR IMMEDIATELY FOLLOWING A MEAL), Disp: 90 tablet, Rfl: 3   mirtazapine (REMERON) 15 MG tablet, Take 1 Tablet at Bedtime, Disp: 90 tablet, Rfl: 3   oxyCODONE-acetaminophen (PERCOCET) 5-325 MG tablet, Take 1 tablet by mouth every 4 (four) hours as needed for severe pain., Disp: 30 tablet, Rfl: 0  Social History   Tobacco Use  Smoking Status Every Day   Packs/day: 1.00   Years: 50.00   Total pack years: 50.00   Types: Cigarettes   Last attempt to quit: 09/23/2015   Years since quitting: 6.8  Smokeless Tobacco Never    Allergies  Allergen Reactions   Zoloft  [Sertraline Hcl] Diarrhea   Lisinopril Rash   Wellbutrin [Bupropion] Rash   Objective:  There were no vitals filed for this visit. There is no height or weight on file to calculate BMI. Constitutional Well developed. Well nourished.  Vascular Foot warm and well perfused. Capillary refill normal to all digits.   Neurologic Normal speech. Oriented to person, place, and time. Epicritic sensation to light touch grossly present bilaterally.  Dermatologic Skin mostly reepithelialized.  Small superficial dehiscence noted for which Betadine wet-to-dry gauze dressings were discussed.  No purulent drainage noted no exposure of hardware noted good correction alignment noted  Orthopedic: Tenderness to palpation noted about the surgical site.   Radiographs: 3 views of skeletally mature right foot: Good correction alignment noted.  Hardware is intact.  No signs of backing or loosening noted. Assessment:   No diagnosis found.   Plan:  Patient was evaluated and treated and all questions answered.  S/p foot surgery right -Clinically the dehiscence has improved considerably.  Very small minimal dehiscence noted.  At this time patient will keep it covered with triple antibiotic and a Band-Aid.  I will see her back again for final visit in 4 weeks.  She states understanding.  No follow-ups on file.

## 2022-08-10 ENCOUNTER — Other Ambulatory Visit: Payer: Self-pay | Admitting: Internal Medicine

## 2022-08-15 ENCOUNTER — Encounter: Payer: Self-pay | Admitting: Podiatry

## 2022-08-15 ENCOUNTER — Ambulatory Visit (INDEPENDENT_AMBULATORY_CARE_PROVIDER_SITE_OTHER): Payer: Medicare Other | Admitting: Podiatry

## 2022-08-15 DIAGNOSIS — M19071 Primary osteoarthritis, right ankle and foot: Secondary | ICD-10-CM

## 2022-08-15 DIAGNOSIS — Z9889 Other specified postprocedural states: Secondary | ICD-10-CM

## 2022-08-15 NOTE — Progress Notes (Signed)
  Subjective:  Patient ID: Audrey [name], female    DOB: 12/03/1950,  MRN: [id number]  Chief Complaint  Patient presents with   Routine Post Op    Pt stated that she is doing much better     DOS: 05/05/2022 Procedure: Right first MPJ fusion  72 y.o. female returns for post-op check.  Patient states that she is doing great.  She does not have any pain.  She states that there has not been any oozing.  Bandages clean dry and intact has been doing Betadine wet-to-dry dressing  Review of Systems: Negative except as noted in the HPI. Denies N/V/F/Ch.  Past Medical History:  Diagnosis Date   Anxiety    Arthritis    Chicken pox    Depression    Dizziness    when bending over    Elevated liver enzymes    Fatty liver    Hyperlipidemia    Hypertension    Shingles    Wears glasses     Current Outpatient Medications:    amLODipine-valsartan (EXFORGE) 5-320 MG tablet, Take 1 tablet by mouth daily., Disp: 90 tablet, Rfl: 3   atorvastatin (LIPITOR) 20 MG tablet, Take 1 tablet (20 mg total) by mouth at bedtime., Disp: 90 tablet, Rfl: 3   desvenlafaxine (PRISTIQ) 100 MG 24 hr tablet, Take 1 tablet (100 mg total) by mouth daily., Disp: 90 tablet, Rfl: 3   metoprolol succinate (TOPROL-XL) 100 MG 24 hr tablet, TAKE 1 TABLET BY MOUTH DAILY. TAKE WITH OR IMMEDIATELY FOLLOWING A MEAL. GENERIC EQUIVALENT FOR TOPROL XL., Disp: 90 tablet, Rfl: 3   mirtazapine (REMERON) 15 MG tablet, Take 1 Tablet at Bedtime, Disp: 90 tablet, Rfl: 3  Social History   Tobacco Use  Smoking Status Every Day   Packs/day: 1.00   Years: 50.00   Total pack years: 50.00   Types: Cigarettes   Last attempt to quit: 09/23/2015   Years since quitting: 6.8  Smokeless Tobacco Never    Allergies  Allergen Reactions   Zoloft [Sertraline Hcl] Diarrhea   Lisinopril Rash   Wellbutrin [Bupropion] Rash   Objective:  There were no vitals filed for this visit. There is no height or weight on file to calculate  BMI. Constitutional Well developed. Well nourished.  Vascular Foot warm and well perfused. Capillary refill normal to all digits.   Neurologic Normal speech. Oriented to person, place, and time. Epicritic sensation to light touch grossly present bilaterally.  Dermatologic Skin mostly reepithelialized.  No further be used is noted skin completely epithelialized no signs of infection noted no exposure of hardware noted  Orthopedic: Tenderness to palpation noted about the surgical site.   Radiographs: 3 views of skeletally mature right foot: Good correction alignment noted.  Hardware is intact.  No signs of backing or loosening noted. Assessment:   No diagnosis found.   Plan:  Patient was evaluated and treated and all questions answered.  S/p foot surgery right -Clinically dehiscence has completely reepithelialized.  At this time patient will return to regular activities if any foot and ankle issues on future she will come back and see me.  No follow-ups on file.

## 2022-08-17 NOTE — Progress Notes (Unsigned)
    Derrius Furtick T. Axiel Fjeld, MD, [place] [facility] [place], [address]  Phone: [phone]  FAX: [phone]  Audrey [name] - 72 y.o. female  MRN [id number]  Date of Birth: 1950/08/01  Date: 08/18/2022  PCP: Hoyt Koch, MD  Referral: Hoyt Koch, *  No chief complaint on file.  Subjective:   Audrey [name] is a 72 y.o. very pleasant female patient with There is no height or weight on file to calculate BMI. who presents with the following:  Pleasant patient who presents in follow-up for multi-level DDD of the thoracolumbar spine with intermittent radiculopathy and recalcitrant pain.  I saw her last 05/22/2021, and at that point she was not doing well.  She later messaged me, and since she had failed all manner of conservative care including NSAIDS, muscle relaxants, steroids, and physical therapy, I ordered an MRI of her lumbar spine, but this ultimately was not done.   She is here in follow-up today.  Lumbar films are reviewed again, and they are significant for multi-level DDD, facet arthropathy, and a minor L4-5 anterolisthesis.     Review of Systems is noted in the HPI, as appropriate  Objective:   There were no vitals taken for this visit.  GEN: No acute distress; alert,appropriate. PULM: Breathing comfortably in no respiratory distress PSYCH: Normally interactive.   Laboratory and Imaging Data:  Assessment and Plan:   ***

## 2022-08-18 ENCOUNTER — Ambulatory Visit (INDEPENDENT_AMBULATORY_CARE_PROVIDER_SITE_OTHER): Payer: Medicare Other | Admitting: Family Medicine

## 2022-08-18 ENCOUNTER — Encounter: Payer: Self-pay | Admitting: Family Medicine

## 2022-08-18 VITALS — BP 148/71 | HR 63 | Temp 98.1°F | Ht 63.5 in | Wt 162.1 lb

## 2022-08-18 DIAGNOSIS — M5135 Other intervertebral disc degeneration, thoracolumbar region: Secondary | ICD-10-CM

## 2022-08-18 MED ORDER — CELECOXIB 200 MG PO CAPS: 200.0000 mg | ORAL_CAPSULE | Freq: Every day | ORAL | 2 refills | Status: DC

## 2022-08-18 MED ORDER — PREDNISONE 20 MG PO TABS: ORAL_TABLET | ORAL | 0 refills | Status: DC

## 2022-08-18 MED ORDER — TIZANIDINE HCL 4 MG PO TABS: 4.0000 mg | ORAL_TABLET | Freq: Every evening | ORAL | 2 refills | Status: DC | PRN

## 2022-08-18 NOTE — Patient Instructions (Signed)
Take the prednisone for 14 days, then start the Celecoxib.

## 2022-08-19 ENCOUNTER — Encounter: Payer: Self-pay | Admitting: Family Medicine

## 2022-09-23 ENCOUNTER — Encounter: Payer: Self-pay | Admitting: Family Medicine

## 2022-10-29 DIAGNOSIS — K080 Exfoliation of teeth due to systemic causes: Secondary | ICD-10-CM | POA: Diagnosis not present

## 2022-10-30 DIAGNOSIS — K080 Exfoliation of teeth due to systemic causes: Secondary | ICD-10-CM | POA: Diagnosis not present

## 2022-12-24 ENCOUNTER — Other Ambulatory Visit: Payer: Self-pay | Admitting: Internal Medicine

## 2022-12-24 ENCOUNTER — Telehealth: Payer: Self-pay | Admitting: Internal Medicine

## 2022-12-24 ENCOUNTER — Other Ambulatory Visit (HOSPITAL_COMMUNITY): Payer: Self-pay

## 2022-12-24 DIAGNOSIS — F419 Anxiety disorder, unspecified: Secondary | ICD-10-CM

## 2022-12-24 MED ORDER — MIRTAZAPINE 15 MG PO TABS: ORAL_TABLET | ORAL | 0 refills | Status: DC

## 2022-12-24 MED ORDER — MIRTAZAPINE 15 MG PO TABS
ORAL_TABLET | ORAL | 0 refills | Status: DC
  Filled 2022-12-24: qty 90, 90d supply, fill #0

## 2022-12-24 NOTE — Telephone Encounter (Signed)
Pt called wanting to know if she could get a temporary refilled while she is out of town.  Prescription Request  12/24/2022  LOV: 06/27/2022  What is the name of the medication or equipment? mirtazapine (REMERON) 15 MG tablet   Have you contacted your pharmacy to request a refill? No   Which pharmacy would you like this sent to?  [id number] [address] [place], [place] [address]  [phone]  Patient notified that their request is being sent to the clinical staff for review and that they should receive a response within 2 business days.   Please advise at Mobile [phone] (mobile)

## 2022-12-24 NOTE — Telephone Encounter (Signed)
Pt due for Annual in Nov sent rx to [facility].Marland KitchenRaechel [name]

## 2023-01-20 ENCOUNTER — Other Ambulatory Visit: Payer: Self-pay

## 2023-01-21 ENCOUNTER — Other Ambulatory Visit (HOSPITAL_COMMUNITY): Payer: Self-pay

## 2023-04-29 ENCOUNTER — Ambulatory Visit: Payer: Medicare Other | Admitting: Internal Medicine

## 2023-05-01 ENCOUNTER — Encounter: Payer: Self-pay | Admitting: Internal Medicine

## 2023-05-01 ENCOUNTER — Ambulatory Visit (INDEPENDENT_AMBULATORY_CARE_PROVIDER_SITE_OTHER): Payer: Medicare Other | Admitting: Internal Medicine

## 2023-05-01 ENCOUNTER — Ambulatory Visit (INDEPENDENT_AMBULATORY_CARE_PROVIDER_SITE_OTHER): Payer: Medicare Other

## 2023-05-01 ENCOUNTER — Other Ambulatory Visit: Payer: Self-pay | Admitting: Internal Medicine

## 2023-05-01 VITALS — BP 160/80 | HR 82 | Temp 98.2°F | Ht 63.5 in | Wt 161.0 lb

## 2023-05-01 DIAGNOSIS — G8929 Other chronic pain: Secondary | ICD-10-CM | POA: Diagnosis not present

## 2023-05-01 DIAGNOSIS — M4316 Spondylolisthesis, lumbar region: Secondary | ICD-10-CM | POA: Diagnosis not present

## 2023-05-01 DIAGNOSIS — F419 Anxiety disorder, unspecified: Secondary | ICD-10-CM | POA: Diagnosis not present

## 2023-05-01 DIAGNOSIS — I700 Atherosclerosis of aorta: Secondary | ICD-10-CM | POA: Diagnosis not present

## 2023-05-01 DIAGNOSIS — I10 Essential (primary) hypertension: Secondary | ICD-10-CM

## 2023-05-01 DIAGNOSIS — M47816 Spondylosis without myelopathy or radiculopathy, lumbar region: Secondary | ICD-10-CM | POA: Diagnosis not present

## 2023-05-01 DIAGNOSIS — Z0000 Encounter for general adult medical examination without abnormal findings: Secondary | ICD-10-CM

## 2023-05-01 DIAGNOSIS — M5126 Other intervertebral disc displacement, lumbar region: Secondary | ICD-10-CM | POA: Diagnosis not present

## 2023-05-01 DIAGNOSIS — M545 Low back pain, unspecified: Secondary | ICD-10-CM

## 2023-05-01 DIAGNOSIS — E7800 Pure hypercholesterolemia, unspecified: Secondary | ICD-10-CM | POA: Diagnosis not present

## 2023-05-01 DIAGNOSIS — F32A Depression, unspecified: Secondary | ICD-10-CM

## 2023-05-01 LAB — COMPREHENSIVE METABOLIC PANEL
ALT: 15 U/L (ref 0–35)
AST: 16 U/L (ref 0–37)
Albumin: 4.7 g/dL (ref 3.5–5.2)
Alkaline Phosphatase: 85 U/L (ref 39–117)
BUN: 15 mg/dL (ref 6–23)
CO2: 28 meq/L (ref 19–32)
Calcium: 9.7 mg/dL (ref 8.4–10.5)
Chloride: 105 meq/L (ref 96–112)
Creatinine, Ser: 0.71 mg/dL (ref 0.40–1.20)
GFR: 85.46 mL/min (ref >60.00)
Glucose, Bld: 112 mg/dL — ABNORMAL HIGH (ref 70–99)
Potassium: 4.4 meq/L (ref 3.5–5.1)
Sodium: 139 meq/L (ref 135–145)
Total Bilirubin: 0.5 mg/dL (ref 0.2–1.2)
Total Protein: 7.1 g/dL (ref 6.0–8.3)

## 2023-05-01 LAB — CBC
HCT: 43 % (ref 36.0–46.0)
Hemoglobin: 14.5 g/dL (ref 12.0–15.0)
MCHC: 33.8 g/dL (ref 30.0–36.0)
MCV: 95.2 fL (ref 78.0–100.0)
Platelets: 195 10*3/uL (ref 150.0–400.0)
RBC: 4.51 Mil/uL (ref 3.87–5.11)
RDW: 14.4 % (ref 11.5–15.5)
WBC: 8.6 10*3/uL (ref 4.0–10.5)

## 2023-05-01 LAB — LIPID PANEL
Cholesterol: 164 mg/dL (ref 0–200)
HDL: 57.7 mg/dL (ref >39.00)
LDL Cholesterol: 88 mg/dL (ref 0–99)
NonHDL: 106.74
Total CHOL/HDL Ratio: 3
Triglycerides: 95 mg/dL (ref 0.0–149.0)
VLDL: 19 mg/dL (ref 0.0–40.0)

## 2023-05-01 MED ORDER — METOPROLOL SUCCINATE ER 100 MG PO TB24: 100.0000 mg | ORAL_TABLET | Freq: Every day | ORAL | 3 refills | Status: DC

## 2023-05-01 MED ORDER — AMLODIPINE BESYLATE-VALSARTAN 5-320 MG PO TABS: 1.0000 | ORAL_TABLET | Freq: Every day | ORAL | 3 refills | Status: DC

## 2023-05-01 MED ORDER — MIRTAZAPINE 15 MG PO TABS: 15.0000 mg | ORAL_TABLET | Freq: Every day | ORAL | 3 refills | Status: DC

## 2023-05-01 MED ORDER — DESVENLAFAXINE SUCCINATE ER 100 MG PO TB24: 100.0000 mg | ORAL_TABLET | Freq: Every day | ORAL | 3 refills | Status: DC

## 2023-05-01 MED ORDER — ATORVASTATIN CALCIUM 20 MG PO TABS: 20.0000 mg | ORAL_TABLET | Freq: Every day | ORAL | 3 refills | Status: DC

## 2023-05-01 NOTE — Addendum Note (Signed)
Addended by: Hillard Danker A on: 05/01/2023 11:58 AM   Modules accepted: Orders, Level of Service

## 2023-05-01 NOTE — Progress Notes (Signed)
Subjective:   Patient ID: Audrey [name], female    DOB: 1951-04-03, 72 y.o.   MRN: [id number]  HPI Here for medicare wellness and physical, no new complaints. Please see A/P for status and treatment of chronic medical problems.   Diet: heart healthy Physical activity: walks 5 miles daily Depression/mood screen: negative Hearing: intact to whispered voice, mild to moderate loss not requiring hearing aid Visual acuity: grossly normal with lens, performs annual eye exam  ADLs: capable Fall risk: none Home safety: good Cognitive evaluation: intact to orientation, naming, recall and repetition EOL planning: adv directives discussed  Flowsheet Row Clinical Support from 10/31/2021 in [facility] [place]  PHQ-2 Total Score 0       Flowsheet Row Office Visit from 08/29/2020 in [facility] [place]  PHQ-9 Total Score 0         08/29/2020    3:54 PM 10/31/2021   11:07 AM 08/18/2022   11:52 AM 11/19/2022    6:11 PM 05/01/2023   11:06 AM  Fall Risk  Falls in the past year? 0 1 1 1  0  Was there an injury with Fall?  0 1  0  Fall Risk Category Calculator  1 3  0  Fall Risk Category (Retired)  Low     (RETIRED) Patient Fall Risk Level  Low fall risk     Patient at Risk for Falls Due to  No Fall Risks History of fall(s)    Fall risk Follow up  Falls prevention discussed Falls evaluation completed  Falls evaluation completed    I have personally reviewed and have noted 1. The patient's medical and social history - reviewed today no changes 2. Their use of alcohol, tobacco or illicit drugs 3. Their current medications and supplements 4. The patient's functional ability including ADL's, fall risks, home safety risks and hearing or visual impairment. 5. Diet and physical activities 6. Evidence for depression or mood disorders 7. Care team reviewed and updated 8.  The patient is not on an opioid pain medication.  Patient Care  Team: Myrlene Broker, MD as PCP - General (Internal Medicine) Irene Limbo., MD as Consulting Physician (Ophthalmology) Past Medical History:  Diagnosis Date   Anxiety    Arthritis    Chicken pox    Depression    Dizziness    when bending over    Elevated liver enzymes    Fatty liver    Hyperlipidemia    Hypertension    Shingles    Wears glasses    Past Surgical History:  Procedure Laterality Date   CESAREAN SECTION  1987, 72   DILATION AND CURETTAGE OF UTERUS  1986   HALLUX FUSION Right 05/05/2022   Procedure: Carvel Getting, FIRST METATASALPHALANGEAL JOINT;  Surgeon: Candelaria Stagers, DPM;  Location: [facility];  Service: Podiatry;  Laterality: Right;  CHOICE WITH BLOCK   PILONIDAL CYST EXCISION  1972   TONSILLECTOMY  1958   TOTAL HIP ARTHROPLASTY Right 10/16/2015   Procedure: RIGHT TOTAL HIP ARTHROPLASTY ANTERIOR APPROACH;  Surgeon: Durene Romans, MD;  Location: [facility];  Service: Orthopedics;  Laterality: Right;   TOTAL HIP ARTHROPLASTY Left 08/19/2016   Procedure: LEFT TOTAL HIP ARTHROPLASTY ANTERIOR APPROACH;  Surgeon: Durene Romans, MD;  Location: [facility];  Service: Orthopedics;  Laterality: Left;  requests 70 mins   Family History  Problem Relation Age of Onset   Multiple sclerosis Mother    Mental  illness Mother    Arthritis Father    Mental illness Maternal Aunt    Colon cancer Maternal Aunt 70   Heart disease Maternal Grandmother    Arthritis Paternal Grandmother    Hyperlipidemia Paternal Grandmother    Hypertension Paternal Grandmother    Heart disease Paternal Grandfather    Hypertension Paternal Grandfather    Mental illness Paternal Grandfather    Stomach cancer Neg Hx    Rectal cancer Neg Hx    Esophageal cancer Neg Hx      Review of Systems  Constitutional: Negative.   HENT: Negative.    Eyes: Negative.   Respiratory:  Negative for cough, chest tightness and shortness of breath.   Cardiovascular:  Negative for chest pain,  palpitations and leg swelling.  Gastrointestinal:  Negative for abdominal distention, abdominal pain, constipation, diarrhea, nausea and vomiting.  Musculoskeletal:  Positive for arthralgias and back pain.  Skin: Negative.   Neurological: Negative.   Psychiatric/Behavioral: Negative.      Objective:  Physical Exam Constitutional:      Appearance: She is well-developed.  HENT:     Head: Normocephalic and atraumatic.  Cardiovascular:     Rate and Rhythm: Normal rate and regular rhythm.  Pulmonary:     Effort: Pulmonary effort is normal. No respiratory distress.     Breath sounds: Normal breath sounds. No wheezing or rales.  Abdominal:     General: Bowel sounds are normal. There is no distension.     Palpations: Abdomen is soft.     Tenderness: There is no abdominal tenderness. There is no rebound.  Musculoskeletal:        General: Tenderness present.     Cervical back: Normal range of motion.  Skin:    General: Skin is warm and dry.  Neurological:     Mental Status: She is alert and oriented to [name], place, and time.     Coordination: Coordination normal.     Vitals:   05/01/23 1100 05/01/23 1106  BP: (!) 160/80 (!) 160/80  Pulse: 82   Temp: 98.2 F (36.8 C)   TempSrc: Oral   SpO2: 99%   Weight: 161 lb (73 kg)   Height: 5' 3.5" (1.613 m)     Assessment & Plan:

## 2023-05-01 NOTE — Assessment & Plan Note (Signed)
Flu shot complete for season. Pneumonia complete. Shingrix due at pharmacy. Tetanus due at pharmacy. Colonoscopy due 2026. Mammogram due 2026, pap smear aged out and dexa complete. Lung cancer screening declines. Counseled about sun safety and mole surveillance. Counseled about the dangers of distracted driving. Given 10 year screening recommendations.

## 2023-05-01 NOTE — Assessment & Plan Note (Signed)
Overall stable and would like to continue pristiq 100 mg daily and remeron 15 mg at bedtime and refilled today.

## 2023-05-01 NOTE — Patient Instructions (Signed)
1800 QUIT NOW or quitnow.org can send you free nicotine patches.

## 2023-05-01 NOTE — Assessment & Plan Note (Signed)
BP elevated today but at goal home readings. Will check CMP and adjust as needed. Taking amlodipine/valsartan 5/320 and metoprolol 100 mg daily.

## 2023-05-01 NOTE — Assessment & Plan Note (Signed)
She was recommended MRI to assess. No recent x-ray will start with that today.

## 2023-05-01 NOTE — Assessment & Plan Note (Signed)
Checking lipid panel and adjust lipitor 20 mg daily as needed. 

## 2023-05-07 DIAGNOSIS — K080 Exfoliation of teeth due to systemic causes: Secondary | ICD-10-CM | POA: Diagnosis not present

## 2023-05-14 NOTE — Telephone Encounter (Signed)
Please send in refill for patient

## 2023-05-15 ENCOUNTER — Other Ambulatory Visit: Payer: Self-pay

## 2023-05-15 DIAGNOSIS — F419 Anxiety disorder, unspecified: Secondary | ICD-10-CM

## 2023-05-15 MED ORDER — MIRTAZAPINE 15 MG PO TABS: 15.0000 mg | ORAL_TABLET | Freq: Every day | ORAL | 3 refills | Status: DC

## 2023-05-26 NOTE — Progress Notes (Unsigned)
    Jozette Castrellon T. Sung Renton, MD, [facility] at [facility] [address] [place], [address]  Phone: [phone]  FAX: [phone]  Audrey [name] - 72 y.o. female  MRN [id number]  Date of Birth: 06-27-1950  Date: 05/27/2023  PCP: Myrlene Broker, MD  Referral: Myrlene Broker, *  No chief complaint on file.  Subjective:   Audrey [name] is a 72 y.o. very pleasant female patient with There is no height or weight on file to calculate BMI. who presents with the following:  Audrey [name] is a very nice patient who I recall well over the years.  She is currently a patient of Dr. Okey Dupre, previously a patient of Nicki Reaper.  The last time I saw her, I placed her on a 14-day course of some prednisone followed by Celebrex 200 mg and Zanaflex at nighttime as needed.  The last time I saw her, she had had an exacerbation of her back pain.  She previously did have some improvement with physical therapy.  I independently reviewed the patient's plain back x-ray from May 01, 2023, and this is most significant for multilevel degenerative disc disease.  This is worse in the upper lumbar spine and the lower thoracic spine.    Review of Systems is noted in the HPI, as appropriate  Objective:   There were no vitals taken for this visit.  GEN: No acute distress; alert,appropriate. PULM: Breathing comfortably in no respiratory distress PSYCH: Normally interactive.   Laboratory and Imaging Data:  Assessment and Plan:   ***

## 2023-05-27 ENCOUNTER — Ambulatory Visit (INDEPENDENT_AMBULATORY_CARE_PROVIDER_SITE_OTHER): Payer: Medicare Other | Admitting: Family Medicine

## 2023-05-27 ENCOUNTER — Encounter: Payer: Self-pay | Admitting: Family Medicine

## 2023-05-27 VITALS — BP 160/80 | HR 66 | Temp 98.1°F | Ht 63.5 in | Wt 162.0 lb

## 2023-05-27 DIAGNOSIS — M549 Dorsalgia, unspecified: Secondary | ICD-10-CM

## 2023-05-27 DIAGNOSIS — G8929 Other chronic pain: Secondary | ICD-10-CM

## 2023-05-27 DIAGNOSIS — M5135 Other intervertebral disc degeneration, thoracolumbar region: Secondary | ICD-10-CM | POA: Diagnosis not present

## 2023-05-27 MED ORDER — ALPRAZOLAM 0.5 MG PO TABS: ORAL_TABLET | ORAL | 0 refills | Status: DC

## 2023-05-27 MED ORDER — PREDNISONE 20 MG PO TABS: ORAL_TABLET | ORAL | 0 refills | Status: DC

## 2023-06-09 ENCOUNTER — Ambulatory Visit
Admission: RE | Admit: 2023-06-09 | Discharge: 2023-06-09 | Disposition: A | Discharge status: Home / Self Care | Payer: Medicare Other | Source: Ambulatory Visit | Attending: Family Medicine | Admitting: Family Medicine

## 2023-06-09 DIAGNOSIS — M48061 Spinal stenosis, lumbar region without neurogenic claudication: Secondary | ICD-10-CM | POA: Diagnosis not present

## 2023-06-09 DIAGNOSIS — M5126 Other intervertebral disc displacement, lumbar region: Secondary | ICD-10-CM | POA: Diagnosis not present

## 2023-06-09 DIAGNOSIS — G8929 Other chronic pain: Secondary | ICD-10-CM

## 2023-06-09 DIAGNOSIS — M5135 Other intervertebral disc degeneration, thoracolumbar region: Secondary | ICD-10-CM

## 2023-06-10 DIAGNOSIS — F3342 Major depressive disorder, recurrent, in full remission: Secondary | ICD-10-CM | POA: Diagnosis not present

## 2023-06-15 DIAGNOSIS — H524 Presbyopia: Secondary | ICD-10-CM | POA: Diagnosis not present

## 2023-06-15 DIAGNOSIS — H25813 Combined forms of age-related cataract, bilateral: Secondary | ICD-10-CM | POA: Diagnosis not present

## 2023-07-01 ENCOUNTER — Encounter: Payer: Self-pay | Admitting: Family Medicine

## 2023-07-01 DIAGNOSIS — M5135 Other intervertebral disc degeneration, thoracolumbar region: Secondary | ICD-10-CM

## 2023-07-01 DIAGNOSIS — M5416 Radiculopathy, lumbar region: Secondary | ICD-10-CM

## 2023-07-01 DIAGNOSIS — M4807 Spinal stenosis, lumbosacral region: Secondary | ICD-10-CM

## 2023-07-01 DIAGNOSIS — G8929 Other chronic pain: Secondary | ICD-10-CM

## 2023-07-01 MED ORDER — TRAMADOL HCL 50 MG PO TABS: 50.0000 mg | ORAL_TABLET | Freq: Three times a day (TID) | ORAL | 0 refills | Status: DC | PRN

## 2023-07-15 NOTE — Progress Notes (Addendum)
Referring Physician:  Hannah Beat, MD [address] [place],  [place] [address]  Primary Physician:  Myrlene Broker, MD  History of Present Illness: 07/16/2023 [name] has a history of HTN, anxiety, depression, and hyperlipidemia.   She has constant right sided LBP with right buttock pain x years. She has some pain into her right lateral hip as well. No leg pain. Pain is worse with walking, standing, and bending. Pain is better with bending forward and touching her toes. She has numbness and tingling in her back. No weakness in her legs.   She was given prednisone taper on 05/27/23 by PCP- this did not help. She is taking ultram prn.   Had improvement with previous PT, but then pain got worse.   She smokes 1/2 PPD x 30 years.   Bowel/Bladder Dysfunction: none  Conservative measures:  Physical therapy: did  2-3 years ago with some improvement.   Multimodal medical therapy including regular antiinflammatories: prednisone, ultram, celebrex, zanaflex Injections:  No epidural injections.   Past Surgery: no spinal surgery  Audrey [name] has no symptoms of cervical myelopathy.  The symptoms are causing a significant impact on the patient's life.   Review of Systems:  A 10 point review of systems is negative, except for the pertinent positives and negatives detailed in the HPI.  Past Medical History: Past Medical History:  Diagnosis Date   Anxiety    Arthritis    Chicken pox    Depression    Dizziness    when bending over    Elevated liver enzymes    Fatty liver    Hyperlipidemia    Hypertension    Shingles    Wears glasses     Past Surgical History: Past Surgical History:  Procedure Laterality Date   CESAREAN SECTION  1987, 25   DILATION AND CURETTAGE OF UTERUS  1986   HALLUX FUSION Right 05/05/2022   Procedure: Carvel Getting, FIRST METATASALPHALANGEAL JOINT;  Surgeon: Candelaria Stagers, DPM;  Location: [facility];   Service: Podiatry;  Laterality: Right;  CHOICE WITH BLOCK   PILONIDAL CYST EXCISION  1972   TONSILLECTOMY  1958   TOTAL HIP ARTHROPLASTY Right 10/16/2015   Procedure: RIGHT TOTAL HIP ARTHROPLASTY ANTERIOR APPROACH;  Surgeon: Durene Romans, MD;  Location: [facility];  Service: Orthopedics;  Laterality: Right;   TOTAL HIP ARTHROPLASTY Left 08/19/2016   Procedure: LEFT TOTAL HIP ARTHROPLASTY ANTERIOR APPROACH;  Surgeon: Durene Romans, MD;  Location: [facility];  Service: Orthopedics;  Laterality: Left;  requests 70 mins    Allergies: Allergies as of 07/16/2023 - Review Complete 07/16/2023  Allergen Reaction Noted   Zoloft [sertraline hcl] Diarrhea 11/13/2014   Lisinopril Rash 11/13/2014   Wellbutrin [bupropion] Rash 11/13/2014    Medications: Outpatient Encounter Medications as of 07/16/2023  Medication Sig   amLODipine-valsartan (EXFORGE) 5-320 MG tablet Take 1 tablet by mouth daily.   atorvastatin (LIPITOR) 20 MG tablet Take 1 tablet (20 mg total) by mouth at bedtime.   desvenlafaxine (PRISTIQ) 100 MG 24 hr tablet Take 1 tablet (100 mg total) by mouth daily.   metoprolol succinate (TOPROL-XL) 100 MG 24 hr tablet Take 1 tablet (100 mg total) by mouth daily.   mirtazapine (REMERON) 15 MG tablet Take 1 tablet (15 mg total) by mouth at bedtime.   traMADol (ULTRAM) 50 MG tablet Take 1 tablet (50 mg total) by mouth every 8 (eight) hours as needed for moderate pain (pain score 4-6). (Patient  not taking: Reported on 07/16/2023)   [DISCONTINUED] ALPRAZolam (XANAX) 0.5 MG tablet Take 1 tablet po 30 minutes before MRI   [DISCONTINUED] predniSONE (DELTASONE) 20 MG tablet 2 tabs po daily for 5 days, then 1 tab po daily for 5 days   No facility-administered encounter medications on file as of 07/16/2023.    Social History: Social History   Tobacco Use   Smoking status: Every Day    Current packs/day: 0.50    Average packs/day: 0.9 packs/day for 57.8 years (53.9 ttl pk-yrs)    Types: Cigarettes    Start  date: 09/22/1965    Last attempt to quit: 09/23/2015   Smokeless tobacco: Never  Substance Use Topics   Alcohol use: Yes    Alcohol/week: 9.0 standard drinks of alcohol    Types: 3 Glasses of wine, 6 Cans of beer per week   Drug use: Yes    Types: Marijuana    Comment: gummies    Family Medical History: Family History  Problem Relation Age of Onset   Multiple sclerosis Mother    Mental illness Mother    Arthritis Father    Mental illness Maternal Aunt    Colon cancer Maternal Aunt 59   Heart disease Maternal Grandmother    Arthritis Paternal Grandmother    Hyperlipidemia Paternal Grandmother    Hypertension Paternal Grandmother    Heart disease Paternal Grandfather    Hypertension Paternal Grandfather    Mental illness Paternal Grandfather    Stomach cancer Neg Hx    Rectal cancer Neg Hx    Esophageal cancer Neg Hx     Physical Examination: Vitals:   07/16/23 1321  BP: 130/80    General: Patient is well developed, well nourished, calm, collected, and in no apparent distress. Attention to examination is appropriate.  Respiratory: Patient is breathing without any difficulty.   NEUROLOGICAL:     Awake, alert, oriented to person, place, and time.  Speech is clear and fluent. Fund of knowledge is appropriate.   Cranial Nerves: Pupils equal round and reactive to light.  Facial tone is symmetric.    Mild right lower posterior lumbar tenderness.   No abnormal lesions on exposed skin.   Strength: Side Biceps Triceps Deltoid Interossei Grip Wrist Ext. Wrist Flex.  R 5 5 5 5 5 5 5   L 5 5 5 5 5 5 5    Side Iliopsoas Quads Hamstring PF DF EHL  R 5 5 5 5 5 5   L 5 5 5 5 5 5    Reflexes are 2+ and symmetric at the biceps, brachioradialis, patella and achilles.   Hoffman's is absent.  Clonus is not present.   Bilateral upper and lower extremity sensation is intact to light touch.     Gait is normal.    No pain with IR/ER of both hips.   She can toe stand on right/left  leg independently. She can heel stand.   Medical Decision Making  Imaging: Lumbar MRI dated 06/09/23:  FINDINGS: Segmentation:  Standard.   Alignment: There is a approximately 0.3 cm retrolisthesis L1 on L2 and L2 on L3. 0.5 cm anterolisthesis L4 on L5 and trace anterolisthesis L5 on S1 are also seen.   Vertebrae:  No fracture, evidence of discitis, or bone lesion.   Conus medullaris and cauda equina: Conus extends to the L1-2 level. Conus and cauda equina appear normal.   Paraspinal and other soft tissues: Negative.   Disc levels:   T11-12 is imaged in the sagittal  plane only. There is a shallow disc bulge but the central canal and foramina appear open.   T12-L1: Loss of disc space height with a shallow bulge. There is also some ligamentum flavum thickening and mild facet arthropathy. Mild central canal narrowing is present. The foramina are open.   L1-2: Mild-to-moderate facet arthropathy is worse on the right. The patient has a right paracentral disc protrusion intending the ventral thecal sac and causing narrowing in the right lateral recess. Disc extending into the foramen causes severe right foraminal stenosis in conjunction with facet degenerative disease. The left foramen is open.   L2-3: There is a broad-based disc bulge with a superimposed central protrusion. Mild to moderate facet arthropathy and ligamentum flavum thickening are seen. Dorsal epidural fat is somewhat prominent. There is moderate central canal stenosis. Moderate to moderately severe bilateral foraminal narrowing appears somewhat worse on the right.   L3-4: Dorsal epidural fat is prominent. There is a shallow broad-based disc bulge with some ligamentum flavum thickening and mild facet arthropathy. Disc and epidural fat cause moderate compression of the thecal sac. Mild bilateral foraminal narrowing is present.   L4-5: Advanced bilateral facet arthropathy is seen. The disc is uncovered with a  shallow bulge and dorsal epidural fat is prominent. There is mild to moderate compression of the thecal sac by disc and epidural fat. The foramina are open.   L5-S1: Moderate to advanced bilateral facet degenerative change appears worse on the right. The disc is uncovered without bulging. The central canal and foramina are open.   IMPRESSION: 1. Right paracentral disc protrusion at L1-2 narrows the right lateral recess and causes severe right foraminal stenosis. 2. Moderate central canal stenosis at L2-3 and L3-4 due to disc and epidural fat. Moderate to moderately severe bilateral foraminal narrowing at L2-3 appears worse on the right. 3. Mild to moderate compression of the thecal sac at L4-5 due to disc and epidural fat. 4. Mild central canal narrowing at T12-L1.     Electronically Signed   By: Drusilla Kanner M.D.   On: 06/27/2023 09:29  Lumbar xrays dated 05/01/23:  FINDINGS: No fracture or bone lesion.   Grade 1 anterolisthesis of L4 on L5. Grade 1 retrolisthesis of L1 on L2 and L2 on L3.   Moderate loss of disc height at T12-L1. Minor loss of disc height at L1-L2. Mild loss of disc height at L2-L3. Remaining lumbar disc spaces are well preserved.   Bilateral facet degenerative changes at L4-L5 and L5-S1.   Scattered aortic atherosclerotic calcifications. Soft tissues are otherwise unremarkable.   IMPRESSION: 1. No fracture or acute finding. 2. Disc and facet degenerative changes as detailed, stable compared to the prior radiographs.     Electronically Signed   By: Amie Portland M.D.   On: 05/20/2023 11:22  I have personally reviewed the images and agree with the above interpretation.  Assessment and Plan: Ms. Bieker has constant right sided LBP with right buttock pain x years. She has some pain into her right lateral hip as well. No leg pain. Pain is worse with walking, standing, and bending.  She has known lumbar spondylosis with retrolisthesis L1-L2 and  L2-L3, spondylolisthesis L4-L5 and L5-S1. She has moderate central stenosis L2-L3 and mild/moderate central stenosis L4-L5 along with multilevel foraminal stenosis.   LBP likely due to slips/facet hypertrophy L4-S1.    Treatment options discussed with patient and following plan made:   - Lumbar xrays with flex/ext to evaluate spondylolisthesis. Will message her with results.  -  Referral to pain management ([facility]) to discuss possible lumbar injections. She leaves for [place] mid February and would like seen prior. Message to Olegario Messier to see if this is possible.  - PT for lumbar spine recommended. She declines for now. Would need to revisit prior to any surgical discussions. Would also need to discuss smoking cessation.  - Follow up in 6-8 weeks and prn.   I spent a total of 40 minutes in face-to-face and non-face-to-face activities related to this patient's care today including review of outside records, review of imaging, review of symptoms, physical exam, discussion of differential diagnosis, discussion of treatment options, and documentation.   Thank you for involving me in the care of this patient.   ADDENDUM 07/24/23:  Lumbar xrays dated 07/16/23:  FINDINGS: No fracture or bone lesion.   Grade 1 anterolisthesis of L4 on L5. Grade 1 retrolisthesis of T12 on L1, L1 on L2 and L2 on L3.   Moderate loss of disc height at T12-L1. Mild loss of disc height at L1-L2. Moderate loss of disc height at L2-L3.   No subluxation with flexion or extension.   Scattered aortic atherosclerotic calcifications. Bilateral partly imaged total hip arthroplasties appear well positioned.   IMPRESSION: 1. No fracture or acute finding. 2. Degenerative changes as detailed. No subluxation with flexion or extension. No change when compared to the prior radiographs.     Electronically Signed   By: Amie Portland M.D.   On: 07/21/2023 13:10   I have personally reviewed the images and agree with the above  interpretation.  No instability seen on xrays. Message sent to patient.    Drake Leach PA-C Dept. of Neurosurgery

## 2023-07-16 ENCOUNTER — Ambulatory Visit
Admission: RE | Admit: 2023-07-16 | Discharge: 2023-07-16 | Disposition: A | Discharge status: Home / Self Care | Payer: Medicare Other | Source: Ambulatory Visit | Attending: Orthopedic Surgery | Admitting: Orthopedic Surgery

## 2023-07-16 ENCOUNTER — Encounter: Payer: Self-pay | Admitting: Orthopedic Surgery

## 2023-07-16 ENCOUNTER — Ambulatory Visit
Admission: RE | Admit: 2023-07-16 | Discharge: 2023-07-16 | Disposition: A | Discharge status: Home / Self Care | Payer: Medicare Other | Attending: Orthopedic Surgery | Admitting: Orthopedic Surgery

## 2023-07-16 ENCOUNTER — Ambulatory Visit: Payer: Medicare Other | Admitting: Orthopedic Surgery

## 2023-07-16 VITALS — BP 130/80

## 2023-07-16 DIAGNOSIS — M48061 Spinal stenosis, lumbar region without neurogenic claudication: Secondary | ICD-10-CM | POA: Diagnosis not present

## 2023-07-16 DIAGNOSIS — M4316 Spondylolisthesis, lumbar region: Secondary | ICD-10-CM | POA: Insufficient documentation

## 2023-07-16 DIAGNOSIS — I700 Atherosclerosis of aorta: Secondary | ICD-10-CM | POA: Diagnosis not present

## 2023-07-16 DIAGNOSIS — M47816 Spondylosis without myelopathy or radiculopathy, lumbar region: Secondary | ICD-10-CM

## 2023-07-16 DIAGNOSIS — M545 Low back pain, unspecified: Secondary | ICD-10-CM | POA: Diagnosis not present

## 2023-07-16 NOTE — Patient Instructions (Signed)
It was so nice to see you today. Thank you so much for coming in.    You have some wear and tear in your back and this is contributing to your pain.   I ordered xrays of your lower back. You can get these at [facility] (building with the white pillars) off of [address]. The address is [address], [place], [place] [address]. You do not need any appointment. I will message you with results. It can take 2-3 weeks for them to come back.   I want you to see pain management here in [place] (Dr. Cherylann Ratel) to discuss possible lumbar injections. They should call you to schedule an appointment or you can call them at [phone].  I will see you back in 6-8 weeks. Please do not hesitate to call if you have any questions or concerns. You can also message me in MyChart.   Drake Leach PA-C [phone]     The physicians and staff at [facility] are committed to providing excellent care. You may receive a survey asking for feedback about your experience at our office. We value you your feedback and appreciate you taking the time to to fill it out. The [facility] leadership team is also available to discuss your experience in person, feel free to contact us [phone].

## 2023-07-21 ENCOUNTER — Ambulatory Visit
Payer: Medicare Other | Attending: Student in an Organized Health Care Education/Training Program | Admitting: Student in an Organized Health Care Education/Training Program

## 2023-07-21 ENCOUNTER — Encounter: Payer: Self-pay | Admitting: Student in an Organized Health Care Education/Training Program

## 2023-07-21 VITALS — BP 174/83 | HR 73 | Temp 97.3°F | Resp 16 | Ht 63.75 in | Wt 166.0 lb

## 2023-07-21 DIAGNOSIS — M47816 Spondylosis without myelopathy or radiculopathy, lumbar region: Secondary | ICD-10-CM

## 2023-07-21 DIAGNOSIS — M5136 Other intervertebral disc degeneration, lumbar region with discogenic back pain only: Secondary | ICD-10-CM

## 2023-07-21 DIAGNOSIS — M48062 Spinal stenosis, lumbar region with neurogenic claudication: Secondary | ICD-10-CM | POA: Diagnosis not present

## 2023-07-21 DIAGNOSIS — G894 Chronic pain syndrome: Secondary | ICD-10-CM | POA: Diagnosis not present

## 2023-07-21 DIAGNOSIS — G8929 Other chronic pain: Secondary | ICD-10-CM | POA: Diagnosis not present

## 2023-07-21 DIAGNOSIS — M4726 Other spondylosis with radiculopathy, lumbar region: Secondary | ICD-10-CM

## 2023-07-21 DIAGNOSIS — M5416 Radiculopathy, lumbar region: Secondary | ICD-10-CM

## 2023-07-21 MED ORDER — GABAPENTIN 100 MG PO CAPS: ORAL_CAPSULE | ORAL | 0 refills | Status: DC

## 2023-07-21 NOTE — Progress Notes (Signed)
Patient: Audrey [name]  Service Category: E/M  Provider: Edward Jolly, MD  DOB: 06/16/1951  DOS: 07/21/2023  Referring Provider: Gardiner Rhyme  MRN: [id number]  Setting: Ambulatory outpatient  PCP: Myrlene Broker, MD  Type: New Patient  Specialty: Interventional Pain Management    Location: Office  Delivery: Face-to-face     Primary Reason(s) for Visit: Encounter for initial evaluation of one or more chronic problems (new to examiner) potentially causing chronic pain, and posing a threat to normal musculoskeletal function. (Level of risk: High) CC: Other (Right buttocks into right hip,  bilateral hip joint replacements )  HPI  Audrey [name] is a 73 y.o. year old, female patient, who comes for the first time to our practice referred by Drake Leach, PA-C for our initial evaluation of her chronic pain. She has Anxiety and depression; HLD (hyperlipidemia); Essential hypertension; Routine general medical examination at a health care facility; Discharge from right nipple; Chronic bilateral low back pain without sciatica; Spinal stenosis, lumbar region, with neurogenic claudication; Chronic radicular lumbar pain; Lumbar spondylosis; Degeneration of intervertebral disc of lumbar region with discogenic back pain; and Chronic pain syndrome on their problem list. Today she comes in for evaluation of her Other (Right buttocks into right hip,  bilateral hip joint replacements )  Pain Assessment: Location: Right Buttocks Radiating: into right hip Onset: More than a month ago Duration: Chronic pain Quality: Discomfort, Spasm, Constant Severity: 8 /10 (subjective, self-reported pain score)  Effect on ADL: does not hurt until she moves, most of the time worse over the period of the day, more she walks, the worse the pain. Timing: Constant Modifying factors: bending over relieves the pain but difficult to straighten back up.  Advil 600mg  po daily.  had tramadol which did not help. BP: (!) 174/83  HR:  73  Onset and Duration: Gradual and Present longer than 3 months Cause of pain: Unknown Severity: Getting worse, NAS-11 at its worse: 10/10, NAS-11 at its best: 2/10, NAS-11 now: 2/10, and NAS-11 on the average: 7/10 Timing: Night and During activity or exercise Aggravating Factors: Bending, Climbing, Kneeling, Lifiting, Prolonged sitting, Prolonged standing, Squatting, Stooping , Walking, Walking uphill, and Walking downhill Alleviating Factors: Bending, Resting, Sitting, and TENS Associated Problems: Night-time cramps, Depression, Sadness, Spasms, and Sweating Quality of Pain: Aching, Agonizing, Annoying, Burning, Intermittent, Disabling, Getting longer, Hot, and Tingling Previous Examinations or Tests: MRI scan, X-rays, and Neurosurgical evaluation Previous Treatments: Narcotic medications, Physical Therapy, Steroid treatments by mouth, Strengthening exercises, and TENS  Audrey [name] is being evaluated for possible interventional pain management therapies for the treatment of her chronic pain.  Discussed the use of AI scribe software for clinical note transcription with the patient, who gave verbal consent to proceed.  History of Present Illness   The patient presents with buttock pain and difficulty walking.  She experiences buttock pain primarily on the right side, which has been worsening over the past three years. The pain is not associated with back pain but has progressively made walking difficult. She finds some relief by bending down as if tying her shoes, although this maneuver is becoming increasingly challenging. The pain is consistent and worsening, impacting her ability to walk her dog.  She has a history of two hip replacements over ten years ago, performed in [place]. She has tried various treatments for her pain, including a month of physical therapy, which provided relief for about six months. Prednisone was initially effective for six months, but a recent course did  not  alleviate her symptoms. She has not tried gabapentin or Lyrica before.  Her current medications include Pristiq and Remeron for depression. She was taking tramadol but found it unhelpful and has since stopped. She smokes and denies having trouble sleeping at night.  She has a history of being a runner, having completed three half marathons, and both she and her partner have had hip replacements. Her pain affects her daily activities.      Audrey [name] has been informed that this initial visit was an evaluation only.  On the follow up appointment I will go over the results, including ordered tests and available interventional therapies. At that time she will have the opportunity to decide whether to proceed with offered therapies or not. In the event that Audrey [name] prefers avoiding interventional options, this will conclude our involvement in the case.  Medication management recommendations may be provided upon request.  Patient informed that diagnostic tests may be ordered to assist in identifying underlying causes, narrow the list of differential diagnoses and aid in determining candidacy for (or contraindications to) planned therapeutic interventions.  Meds   Current Outpatient Medications:    amLODipine-valsartan (EXFORGE) 5-320 MG tablet, Take 1 tablet by mouth daily., Disp: 90 tablet, Rfl: 3   atorvastatin (LIPITOR) 20 MG tablet, Take 1 tablet (20 mg total) by mouth at bedtime., Disp: 90 tablet, Rfl: 3   desvenlafaxine (PRISTIQ) 100 MG 24 hr tablet, Take 1 tablet (100 mg total) by mouth daily., Disp: 90 tablet, Rfl: 3   gabapentin (NEURONTIN) 100 MG capsule, Take 1 capsule (100 mg total) by mouth 3 (three) times daily for 7 days, THEN 2 capsules (200 mg total) 3 (three) times daily for 7 days, THEN 3 capsules (300 mg total) 3 (three) times daily for 14 days., Disp: 189 capsule, Rfl: 0   ibuprofen (ADVIL) 200 MG tablet, Take 600 mg by mouth every 6 (six) hours as needed for mild pain (pain  score 1-3) or moderate pain (pain score 4-6). Reports that she takes 3 - 4 tablets daily as needed for pain., Disp: , Rfl:    metoprolol succinate (TOPROL-XL) 100 MG 24 hr tablet, Take 1 tablet (100 mg total) by mouth daily., Disp: 90 tablet, Rfl: 3   mirtazapine (REMERON) 15 MG tablet, Take 1 tablet (15 mg total) by mouth at bedtime., Disp: 90 tablet, Rfl: 3   traMADol (ULTRAM) 50 MG tablet, Take 1 tablet (50 mg total) by mouth every 8 (eight) hours as needed for moderate pain (pain score 4-6). (Patient not taking: Reported on 07/21/2023), Disp: 20 tablet, Rfl: 0  Imaging Review  MR Lumbar Spine Wo Contrast  Narrative CLINICAL DATA:  Low back pain radiating to the right for at least 3 years. No known injury.  EXAM: MRI LUMBAR SPINE WITHOUT CONTRAST  TECHNIQUE: Multiplanar, multisequence MR imaging of the lumbar spine was performed. No intravenous contrast was administered.  COMPARISON:  Plain films lumbar spine 05/01/2023.  FINDINGS: Segmentation:  Standard.  Alignment: There is a approximately 0.3 cm retrolisthesis L1 on L2 and L2 on L3. 0.5 cm anterolisthesis L4 on L5 and trace anterolisthesis L5 on S1 are also seen.  Vertebrae:  No fracture, evidence of discitis, or bone lesion.  Conus medullaris and cauda equina: Conus extends to the L1-2 level. Conus and cauda equina appear normal.  Paraspinal and other soft tissues: Negative.  Disc levels:  T11-12 is imaged in the sagittal plane only. There is a shallow disc bulge but the central canal  and foramina appear open.  T12-L1: Loss of disc space height with a shallow bulge. There is also some ligamentum flavum thickening and mild facet arthropathy. Mild central canal narrowing is present. The foramina are open.  L1-2: Mild-to-moderate facet arthropathy is worse on the right. The patient has a right paracentral disc protrusion intending the ventral thecal sac and causing narrowing in the right lateral recess. Disc  extending into the foramen causes severe right foraminal stenosis in conjunction with facet degenerative disease. The left foramen is open.  L2-3: There is a broad-based disc bulge with a superimposed central protrusion. Mild to moderate facet arthropathy and ligamentum flavum thickening are seen. Dorsal epidural fat is somewhat prominent. There is moderate central canal stenosis. Moderate to moderately severe bilateral foraminal narrowing appears somewhat worse on the right.  L3-4: Dorsal epidural fat is prominent. There is a shallow broad-based disc bulge with some ligamentum flavum thickening and mild facet arthropathy. Disc and epidural fat cause moderate compression of the thecal sac. Mild bilateral foraminal narrowing is present.  L4-5: Advanced bilateral facet arthropathy is seen. The disc is uncovered with a shallow bulge and dorsal epidural fat is prominent. There is mild to moderate compression of the thecal sac by disc and epidural fat. The foramina are open.  L5-S1: Moderate to advanced bilateral facet degenerative change appears worse on the right. The disc is uncovered without bulging. The central canal and foramina are open.  IMPRESSION: 1. Right paracentral disc protrusion at L1-2 narrows the right lateral recess and causes severe right foraminal stenosis. 2. Moderate central canal stenosis at L2-3 and L3-4 due to disc and epidural fat. Moderate to moderately severe bilateral foraminal narrowing at L2-3 appears worse on the right. 3. Mild to moderate compression of the thecal sac at L4-5 due to disc and epidural fat. 4. Mild central canal narrowing at T12-L1.   Electronically Signed By: Drusilla Kanner M.D. On: 06/27/2023 09:29   DG Lumbar Spine Complete  Narrative CLINICAL DATA:  Low back pain for 3 years. History of bilateral hip replacements 5 years ago. No known injury.  EXAM: LUMBAR SPINE - COMPLETE 4+ VIEW  COMPARISON:   08/02/2020.  FINDINGS: No fracture or bone lesion.  Grade 1 anterolisthesis of L4 on L5. Grade 1 retrolisthesis of L1 on L2 and L2 on L3.  Moderate loss of disc height at T12-L1. Minor loss of disc height at L1-L2. Mild loss of disc height at L2-L3. Remaining lumbar disc spaces are well preserved.  Bilateral facet degenerative changes at L4-L5 and L5-S1.  Scattered aortic atherosclerotic calcifications. Soft tissues are otherwise unremarkable.  IMPRESSION: 1. No fracture or acute finding. 2. Disc and facet degenerative changes as detailed, stable compared to the prior radiographs.   Electronically Signed By: Amie Portland M.D. On: 05/20/2023 11:22  DG HIP UNILAT W OR W/O PELVIS 2-3 VIEWS RIGHT  Narrative CLINICAL DATA:  Right hip pain.  EXAM: DG HIP (WITH OR WITHOUT PELVIS) 2-3V RIGHT  COMPARISON:  No prior.  FINDINGS: Degenerative changes lumbar spine and both hips. Degenerative changes about the right hip per particular severe. Scleroses of the right femoral head noted. Avascular necrosis cannot be excluded.  IMPRESSION: Degenerative changes lumbar spine and both hips. Degenerative changes about the right hip are particularly severe. Right femoral head is sclerotic, this may be secondary to degenerative change however avascular necrosis cannot be excluded. MRI of the right hip can be obtained to further evaluate.   Electronically Signed By: Maisie Fus  Register On:  06/06/2015 16:22  DG Wrist Complete Right  Narrative CLINICAL DATA:  Wrist pain.  Fall 3 weeks ago.  EXAM: RIGHT WRIST - COMPLETE 3+ VIEW  COMPARISON:  None.  FINDINGS: Degenerative changes at the first carpometacarpal joint with joint space narrowing and spurring. No acute bony abnormality. Specifically, no fracture, subluxation, or dislocation. Soft tissues are intact.  IMPRESSION: No acute bony abnormality.   Electronically Signed By: Charlett Nose M.D. On: 03/04/2016  15:57  Wrist-L DG Complete: No results found for this or any previous visit.   Hand Imaging: Hand-R DG Complete: No results found for this or any previous visit.  Hand-L DG Complete: No results found for this or any previous visit.   Complexity Note: Imaging results reviewed.                         ROS  Cardiovascular: High blood pressure and Needs antibiotics prior to dental procedures Pulmonary or Respiratory: Smoking Neurological: No reported neurological signs or symptoms such as seizures, abnormal skin sensations, urinary and/or fecal incontinence, being born with an abnormal open spine and/or a tethered spinal cord Psychological-Psychiatric: Psychiatric disorder, Anxiousness, Depressed, and Prone to panicking Gastrointestinal: No reported gastrointestinal signs or symptoms such as vomiting or evacuating blood, reflux, heartburn, alternating episodes of diarrhea and constipation, inflamed or scarred liver, or pancreas or irrregular and/or infrequent bowel movements Genitourinary: No reported renal or genitourinary signs or symptoms such as difficulty voiding or producing urine, peeing blood, non-functioning kidney, kidney stones, difficulty emptying the bladder, difficulty controlling the flow of urine, or chronic kidney disease Hematological: No reported hematological signs or symptoms such as prolonged bleeding, low or poor functioning platelets, bruising or bleeding easily, hereditary bleeding problems, low energy levels due to low hemoglobin or being anemic Endocrine: No reported endocrine signs or symptoms such as high or low blood sugar, rapid heart rate due to high thyroid levels, obesity or weight gain due to slow thyroid or thyroid disease Rheumatologic: Joint aches and or swelling due to excess weight (Osteoarthritis) and Rheumatoid arthritis Musculoskeletal: Negative for myasthenia gravis, muscular dystrophy, multiple sclerosis or malignant hyperthermia Work History:  Retired  Allergies  Ms. Salamon is allergic to zoloft [sertraline hcl], lisinopril, and wellbutrin [bupropion].  Laboratory Chemistry Profile   Renal Lab Results  Component Value Date   BUN 15 05/01/2023   CREATININE 0.71 05/01/2023   BCR NOT APPLICABLE 03/19/2018   GFR 85.46 05/01/2023   GFRAA >60 08/20/2016   GFRNONAA >60 08/20/2016   PROTEINUR NEGATIVE 10/10/2015     Electrolytes Lab Results  Component Value Date   NA 139 05/01/2023   K 4.4 05/01/2023   CL 105 05/01/2023   CALCIUM 9.7 05/01/2023     Hepatic Lab Results  Component Value Date   AST 16 05/01/2023   ALT 15 05/01/2023   ALBUMIN 4.7 05/01/2023   ALKPHOS 85 05/01/2023     ID Lab Results  Component Value Date   HIV NON-REACTIVE 03/10/2017   STAPHAUREUS POSITIVE (A) 08/12/2016   MRSAPCR NEGATIVE 08/12/2016     Bone Lab Results  Component Value Date   VD25OH 49 03/19/2018     Endocrine Lab Results  Component Value Date   GLUCOSE 112 (H) 05/01/2023   GLUCOSEU NEGATIVE 10/10/2015   HGBA1C 5.9 03/04/2016     Neuropathy Lab Results  Component Value Date   HGBA1C 5.9 03/04/2016   HIV NON-REACTIVE 03/10/2017     CNS No results found for: "COLORCSF", "APPEARCSF", "  RBCCOUNTCSF", "WBCCSF", "POLYSCSF", "LYMPHSCSF", "EOSCSF", "PROTEINCSF", "GLUCCSF", "JCVIRUS", "CSFOLI", "IGGCSF", "LABACHR", "ACETBL"   Inflammation (CRP: Acute  ESR: Chronic) No results found for: "CRP", "ESRSEDRATE", "LATICACIDVEN"   Rheumatology No results found for: "RF", "ANA", "LABURIC", "URICUR", "LYMEIGGIGMAB", "LYMEABIGMQN", "HLAB27"   Coagulation Lab Results  Component Value Date   INR 1.03 10/10/2015   LABPROT 13.3 10/10/2015   APTT 28 10/10/2015   PLT 195.0 05/01/2023     Cardiovascular Lab Results  Component Value Date   HGB 14.5 05/01/2023   HCT 43.0 05/01/2023     Screening Lab Results  Component Value Date   STAPHAUREUS POSITIVE (A) 08/12/2016   MRSAPCR NEGATIVE 08/12/2016   HIV NON-REACTIVE  03/10/2017     Cancer No results found for: "CEA", "CA125", "LABCA2"   Allergens No results found for: "ALMOND", "APPLE", "ASPARAGUS", "AVOCADO", "BANANA", "BARLEY", "BASIL", "BAYLEAF", "GREENBEAN", "LIMABEAN", "WHITEBEAN", "BEEFIGE", "REDBEET", "BLUEBERRY", "BROCCOLI", "CABBAGE", "MELON", "CARROT", "CASEIN", "CASHEWNUT", "CAULIFLOWER", "CELERY"     Note: Lab results reviewed.  PFSH  Drug: Ms. Ostenson  reports current drug use. Drug: Marijuana. Alcohol:  reports current alcohol use of about 9.0 standard drinks of alcohol per week. Tobacco:  reports that she has been smoking cigarettes. She started smoking about 57 years ago. She has a 53.9 pack-year smoking history. She has never used smokeless tobacco. Medical:  has a past medical history of Anxiety, Arthritis, Chicken pox, Depression, Dizziness, Elevated liver enzymes, Fatty liver, Hyperlipidemia, Hypertension, Shingles, and Wears glasses. Family: family history includes Arthritis in her father and paternal grandmother; Colon cancer (age of onset: 39) in her maternal aunt; Heart disease in her maternal grandmother and paternal grandfather; Hyperlipidemia in her paternal grandmother; Hypertension in her paternal grandfather and paternal grandmother; Mental illness in her maternal aunt, mother, and paternal grandfather; Multiple sclerosis in her mother.  Past Surgical History:  Procedure Laterality Date   CESAREAN SECTION  1987, 74   DILATION AND CURETTAGE OF UTERUS  1986   HALLUX FUSION Right 05/05/2022   Procedure: Carvel Getting, FIRST METATASALPHALANGEAL JOINT;  Surgeon: Candelaria Stagers, DPM;  Location: [facility];  Service: Podiatry;  Laterality: Right;  CHOICE WITH BLOCK   PILONIDAL CYST EXCISION  1972   TONSILLECTOMY  1958   TOTAL HIP ARTHROPLASTY Right 10/16/2015   Procedure: RIGHT TOTAL HIP ARTHROPLASTY ANTERIOR APPROACH;  Surgeon: Durene Romans, MD;  Location: [facility];  Service: Orthopedics;  Laterality: Right;    TOTAL HIP ARTHROPLASTY Left 08/19/2016   Procedure: LEFT TOTAL HIP ARTHROPLASTY ANTERIOR APPROACH;  Surgeon: Durene Romans, MD;  Location: [facility];  Service: Orthopedics;  Laterality: Left;  requests 70 mins   Active Ambulatory Problems    Diagnosis Date Noted   Anxiety and depression 11/13/2014   HLD (hyperlipidemia) 11/13/2014   Essential hypertension 11/13/2014   Routine general medical examination at a health care facility 04/23/2022   Discharge from right nipple 06/27/2022   Chronic bilateral low back pain without sciatica 05/01/2023   Spinal stenosis, lumbar region, with neurogenic claudication 07/21/2023   Chronic radicular lumbar pain 07/21/2023   Lumbar spondylosis 07/21/2023   Degeneration of intervertebral disc of lumbar region with discogenic back pain 07/21/2023   Chronic pain syndrome 07/21/2023   Resolved Ambulatory Problems    Diagnosis Date Noted   Smoker 11/13/2014   Fatty liver, alcoholic 11/13/2014   S/P right THA, AA 10/16/2015   Overweight (BMI 25.0-29.9) 10/17/2015   S/P left THA, AA 08/19/2016   Past Medical History:  Diagnosis Date   Anxiety  Arthritis    Chicken pox    Depression    Dizziness    Elevated liver enzymes    Fatty liver    Hyperlipidemia    Hypertension    Shingles    Wears glasses    Constitutional Exam  General appearance: Well nourished, well developed, and well hydrated. In no apparent acute distress Vitals:   07/21/23 0821  BP: (!) 174/83  Pulse: 73  Resp: 16  Temp: (!) 97.3 F (36.3 C)  TempSrc: Temporal  SpO2: 98%  Weight: 166 lb (75.3 kg)  Height: 5' 3.75" (1.619 m)   BMI Assessment: Estimated body mass index is 28.72 kg/m as calculated from the following:   Height as of this encounter: 5' 3.75" (1.619 m).   Weight as of this encounter: 166 lb (75.3 kg).  BMI interpretation table: BMI level Category Range association with higher incidence of chronic pain  <18 kg/m2 Underweight   18.5-24.9 kg/m2 Ideal body  weight   25-29.9 kg/m2 Overweight Increased incidence by 20%  30-34.9 kg/m2 Obese (Class I) Increased incidence by 68%  35-39.9 kg/m2 Severe obesity (Class II) Increased incidence by 136%  >40 kg/m2 Extreme obesity (Class III) Increased incidence by 254%   Patient's current BMI Ideal Body weight  Body mass index is 28.72 kg/m. Ideal body weight: 54.1 kg (119 lb 5.2 oz) Adjusted ideal body weight: 62.6 kg (137 lb 15.9 oz)   BMI Readings from Last 4 Encounters:  07/21/23 28.72 kg/m  05/27/23 28.25 kg/m  05/01/23 28.07 kg/m  08/18/22 28.27 kg/m   Wt Readings from Last 4 Encounters:  07/21/23 166 lb (75.3 kg)  05/27/23 162 lb (73.5 kg)  05/01/23 161 lb (73 kg)  08/18/22 162 lb 2 oz (73.5 kg)    Psych/Mental status: Alert, oriented x 3 (person, place, & time)       Eyes: PERLA Respiratory: No evidence of acute respiratory distress  Lumbar Spine Area Exam  Skin & Axial Inspection: No masses, redness, or swelling Alignment: Symmetrical Functional ROM: Pain restricted ROM       Stability: No instability detected Muscle Tone/Strength: Functionally intact. No obvious neuro-muscular anomalies detected. Sensory (Neurological): Dermatomal pain pattern and MSK Palpation: No palpable anomalies       Provocative Tests: Hyperextension/rotation test: (+) due to pain. Lumbar quadrant test (Kemp's test): (+) bilateral for foraminal stenosis Lateral bending test: (+) due to pain.  Gait & Posture Assessment  Ambulation: Unassisted Gait: Relatively normal for age and body habitus Posture: WNL  Lower Extremity Exam    Side: Right lower extremity  Side: Left lower extremity  Stability: No instability observed          Stability: No instability observed          Skin & Extremity Inspection: Skin color, temperature, and hair growth are WNL. No peripheral edema or cyanosis. No masses, redness, swelling, asymmetry, or associated skin lesions. No contractures.  Skin & Extremity Inspection: Skin  color, temperature, and hair growth are WNL. No peripheral edema or cyanosis. No masses, redness, swelling, asymmetry, or associated skin lesions. No contractures.  Functional ROM: Unrestricted ROM                  Functional ROM: Unrestricted ROM                  Muscle Tone/Strength: Functionally intact. No obvious neuro-muscular anomalies detected.  Muscle Tone/Strength: Functionally intact. No obvious neuro-muscular anomalies detected.  Sensory (Neurological): Unimpaired  Sensory (Neurological): Unimpaired        DTR: Patellar: deferred today Achilles: deferred today Plantar: deferred today  DTR: Patellar: deferred today Achilles: deferred today Plantar: deferred today  Palpation: No palpable anomalies  Palpation: No palpable anomalies    Assessment  Primary Diagnosis & Pertinent Problem List: The primary encounter diagnosis was Spinal stenosis, lumbar region, with neurogenic claudication. Diagnoses of Lumbar radiculopathy, Chronic radicular lumbar pain, Lumbar facet arthropathy, Lumbar spondylosis, Degeneration of intervertebral disc of lumbar region with discogenic back pain, and Chronic pain syndrome were also pertinent to this visit.  Visit Diagnosis (New problems to examiner): 1. Spinal stenosis, lumbar region, with neurogenic claudication   2. Lumbar radiculopathy   3. Chronic radicular lumbar pain   4. Lumbar facet arthropathy   5. Lumbar spondylosis   6. Degeneration of intervertebral disc of lumbar region with discogenic back pain   7. Chronic pain syndrome    Plan of Care (Initial workup plan)  Note: Ms. Baise was reminded that as per protocol, today's visit has been an evaluation only. We have not taken over the patient's controlled substance management.  Problem-specific plan: Assessment and Plan    Buttock Pain Secondary to Lumbar Spine Pathology: SS (F+C) with , Facet disease, Lumbar DDD Chronic right>left-sided buttock pain for three years is likely  due to lumbar spine pathology. MRI shows L1-L2, L2-L3, L3-L4 disc bulges, L4-L5 listhesis, significant disc disease, and spinal stenosis. Pain worsens with walking and improves with bending. Previous treatments, including physical therapy and prednisone, provided temporary relief; tramadol was ineffective. Gabapentin has not been used before. Gabapentin is recommended for nerve pain, with potential benefits from epidural injections, especially considering upcoming travel. Start gabapentin 100 mg TID for one week, increase to 200 mg TID for the second week, and 300 mg TID for the third week if tolerated. Schedule an epidural injection before February 11th. Administer Valium for the procedure. A driver is required for the appointment, and fasting is necessary the night before.  Degenerative Disc Disease MRI indicates significant degenerative disc disease with multiple thin discs and vertebral misalignment, contributing to nerve compression and pain. Long-distance running likely exacerbates the condition. Discussed the impact of running on spinal health and the benefits of continuing physical therapy exercises. Continue physical therapy exercises as previously instructed.  SIJ: likley SIJ pathology as well in context of spine disease and bilateral hip replacement. Consider diagnostic SIJ blk.  General Health Maintenance Smoking and depression are noted, with depression managed by Pristiq and Remeron. Encourage smoking cessation.  Follow-up Schedule a follow-up appointment in two weeks to assess the response to gabapentin and perform the epidural injection.       Procedure Orders         Lumbar Epidural Injection     Pharmacotherapy (current): Medications ordered:  Meds ordered this encounter  Medications   gabapentin (NEURONTIN) 100 MG capsule    Sig: Take 1 capsule (100 mg total) by mouth 3 (three) times daily for 7 days, THEN 2 capsules (200 mg total) 3 (three) times daily for 7 days, THEN 3  capsules (300 mg total) 3 (three) times daily for 14 days.    Dispense:  189 capsule    Refill:  0   Medications administered during this visit: Synthia Innocent. Tokarski had no medications administered during this visit.   Ms. Treiber was informed that there is no guarantee that she would be a candidate for interventional therapies. The decision will be based on the results of diagnostic  studies, as well as Ms. Hermans's risk profile.  Procedure(s) under consideration:  L-MBNB SIJ    Provider-requested follow-up: Return in about 8 days (around 07/29/2023) for Right L3/4 ESI, in clinic (PO Valium 5mg ).  Future Appointments  Date Time Provider Department Center  09/10/2023 11:00 AM Drake Leach, [place] [facility] None    Duration of encounter: .  Total time on encounter, as per AMA guidelines included both the face-to-face and non-face-to-face time personally spent by the physician and/or other qualified health care professional(s) on the day of the encounter (includes time in activities that require the physician or other qualified health care professional and does not include time in activities normally performed by clinical staff). Physician's time may include the following activities when performed: Preparing to see the patient (e.g., pre-charting review of records, searching for previously ordered imaging, lab work, and nerve conduction tests) Review of prior analgesic pharmacotherapies. Reviewing PMP Interpreting ordered tests (e.g., lab work, imaging, nerve conduction tests) Performing post-procedure evaluations, including interpretation of diagnostic procedures Obtaining and/or reviewing separately obtained history Performing a medically appropriate examination and/or evaluation Counseling and educating the patient/family/caregiver Ordering medications, tests, or procedures Referring and communicating with other health care professionals (when not separately reported) Documenting  clinical information in the electronic or other health record Independently interpreting results (not separately reported) and communicating results to the patient/ family/caregiver Care coordination (not separately reported)  Note by: Edward Jolly, MD (AI and TTS technology used. I apologize for any typographical errors that were not detected and corrected.) Date: 07/21/2023; Time: 9:21 AM

## 2023-07-21 NOTE — Progress Notes (Signed)
Safety precautions to be maintained throughout the outpatient stay will include: orient to surroundings, keep bed in low position, maintain call bell within reach at all times, provide assistance with transfer out of bed and ambulation.

## 2023-07-21 NOTE — Patient Instructions (Signed)
Procedure instructions  Do not eat or drink fluids (other than water) for 6 hours before your procedure  No water for 2 hours before your procedure  Take your blood pressure medicine with a sip of water  Arrive 30 minutes before your appointment  Carefully read the "Preparing for your procedure" detailed instructions  If you have questions call us at [phone]  _____________________________________________________________________    ______________________________________________________________________  Preparing for your procedure  Appointments: If you think you may not be able to keep your appointment, call 24-48 hours in advance to cancel. We need time to make it available to others.  During your procedure appointment there will be: No Prescription Refills. No disability issues to discussed. No medication changes or discussions.  Instructions: Food intake: Avoid eating anything solid for at least 8 hours prior to your procedure. Clear liquid intake: You may take clear liquids such as water up to 2 hours prior to your procedure. (No carbonated drinks. No soda.) Transportation: Unless otherwise stated by your physician, bring a driver. Morning Medicines: Except for blood thinners, take all of your other morning medications with a sip of water. Make sure to take your heart and blood pressure medicines. If your blood pressure's lower number is above 100, the case will be rescheduled. Blood thinners: Make sure to stop your blood thinners as instructed.  If you take a blood thinner, but were not instructed to stop it, call our office [phone] and ask to talk to a nurse. Not stopping a blood thinner prior to certain procedures could lead to serious complications. Diabetics on insulin: Notify the staff so that you can be scheduled 1st case in the morning. If your diabetes requires high dose insulin, take only  of your normal insulin dose the morning of the procedure and  notify the staff that you have done so. Preventing infections: Shower with an antibacterial soap the morning of your procedure.  Build-up your immune system: Take 1000 mg of Vitamin C with every meal (3 times a day) the day prior to your procedure. Antibiotics: Inform the nursing staff if you are taking any antibiotics or if you have any conditions that may require antibiotics prior to procedures. (Example: recent joint implants)   Pregnancy: If you are pregnant make sure to notify the nursing staff. Not doing so may result in injury to the fetus, including death.  Sickness: If you have a cold, fever, or any active infections, call and cancel or reschedule your procedure. Receiving steroids while having an infection may result in complications. Arrival: You must be in the facility at least 30 minutes prior to your scheduled procedure. Tardiness: Your scheduled time is also the cutoff time. If you do not arrive at least 15 minutes prior to your procedure, you will be rescheduled.  Children: Do not bring any children with you. Make arrangements to keep them home. Dress appropriately: There is always a possibility that your clothing may get soiled. Avoid long dresses. Valuables: Do not bring any jewelry or valuables.  Reasons to call and reschedule or cancel your procedure: (Following these recommendations will minimize the risk of a serious complication.) Surgeries: Avoid having procedures within 2 weeks of any surgery. (Avoid for 2 weeks before or after any surgery). Flu Shots: Avoid having procedures within 2 weeks of a flu shots or . (Avoid for 2 weeks before or after immunizations). Barium: Avoid having a procedure within 7-10 days after having had a radiological study involving the use of radiological contrast. (  Myelograms, Barium swallow or enema study). Heart attacks: Avoid any elective procedures or surgeries for the initial 6 months after a "Myocardial Infarction" (Heart Attack). Blood  thinners: It is imperative that you stop these medications before procedures. Let us know if you if you take any blood thinner.  Infection: Avoid procedures during or within two weeks of an infection (including chest colds or gastrointestinal problems). Symptoms associated with infections include: Localized redness, fever, chills, night sweats or profuse sweating, burning sensation when voiding, cough, congestion, stuffiness, runny nose, sore throat, diarrhea, nausea, vomiting, cold or Flu symptoms, recent or current infections. It is specially important if the infection is over the area that we intend to treat. Heart and lung problems: Symptoms that may suggest an active cardiopulmonary problem include: cough, chest pain, breathing difficulties or shortness of breath, dizziness, ankle swelling, uncontrolled high or unusually low blood pressure, and/or palpitations. If you are experiencing any of these symptoms, cancel your procedure and contact your primary care physician for an evaluation.  Remember:  Regular Business hours are:  Monday to Thursday 8:00 AM to 4:00 PM  Provider's Schedule: Delano Metz, MD:  Procedure days: Tuesday and Thursday 7:30 AM to 4:00 PM  Edward Jolly, MD:  Procedure days: Monday and Wednesday 7:30 AM to 4:00 PM  Epidural Steroid Injection Patient Information  Description: The epidural space surrounds the nerves as they exit the spinal cord.  In some patients, the nerves can be compressed and inflamed by a bulging disc or a tight spinal canal (spinal stenosis).  By injecting steroids into the epidural space, we can bring irritated nerves into direct contact with a potentially helpful medication.  These steroids act directly on the irritated nerves and can reduce swelling and inflammation which often leads to decreased pain.  Epidural steroids may be injected anywhere along the spine and from the neck to the low back depending upon the location of your pain.   After  numbing the skin with local anesthetic (like Novocaine), a small needle is passed into the epidural space slowly.  You may experience a sensation of pressure while this is being done.  The entire block usually last less than 10 minutes.  Conditions which may be treated by epidural steroids:  Low back and leg pain Neck and arm pain Spinal stenosis Post-laminectomy syndrome Herpes zoster (shingles) pain Pain from compression fractures  Preparation for the injection:  Do not eat any solid food or dairy products within 8 hours of your appointment.  You may drink clear liquids up to 3 hours before appointment.  Clear liquids include water, black coffee, juice or soda.  No milk or cream please. You may take your regular medication, including pain medications, with a sip of water before your appointment  Diabetics should hold regular insulin (if taken separately) and take 1/2 normal NPH dos the morning of the procedure.  Carry some sugar containing items with you to your appointment. A driver must accompany you and be prepared to drive you home after your procedure.  Bring all your current medications with your. An IV may be inserted and sedation may be given at the discretion of the physician.   A blood pressure cuff, EKG and other monitors will often be applied during the procedure.  Some patients may need to have extra oxygen administered for a short period. You will be asked to provide medical information, including your allergies, prior to the procedure.  We must know immediately if you are taking blood thinners (like  Coumadin/Warfarin)  Or if you are allergic to IV iodine contrast (dye). We must know if you could possible be pregnant.  Possible side-effects: Bleeding from needle site Infection (rare, may require surgery) Nerve injury (rare) Numbness & tingling (temporary) Difficulty urinating (rare, temporary) Spinal headache ( a headache worse with upright posture) Light -headedness  (temporary) Pain at injection site (several days) Decreased blood pressure (temporary) Weakness in arm/leg (temporary) Pressure sensation in back/neck (temporary)  Call if you experience: Fever/chills associated with headache or increased back/neck pain. Headache worsened by an upright position. New onset weakness or numbness of an extremity below the injection site Hives or difficulty breathing (go to the emergency room) Inflammation or drainage at the infection site Severe back/neck pain Any new symptoms which are concerning to you  Please note:  Although the local anesthetic injected can often make your back or neck feel good for several hours after the injection, the pain will likely return.  It takes 3-7 days for steroids to work in the epidural space.  You may not notice any pain relief for at least that one week.  If effective, we will often do a series of three injections spaced 3-6 weeks apart to maximally decrease your pain.  After the initial series, we generally will wait several months before considering a repeat injection of the same type.  If you have any questions, please call [phone] [facility]

## 2023-07-29 ENCOUNTER — Ambulatory Visit
Admission: RE | Admit: 2023-07-29 | Discharge: 2023-07-29 | Disposition: A | Discharge status: Home / Self Care | Payer: Medicare Other | Source: Ambulatory Visit | Attending: Student in an Organized Health Care Education/Training Program | Admitting: Student in an Organized Health Care Education/Training Program

## 2023-07-29 ENCOUNTER — Ambulatory Visit
Payer: Medicare Other | Attending: Student in an Organized Health Care Education/Training Program | Admitting: Student in an Organized Health Care Education/Training Program

## 2023-07-29 DIAGNOSIS — M48062 Spinal stenosis, lumbar region with neurogenic claudication: Secondary | ICD-10-CM | POA: Diagnosis not present

## 2023-07-29 DIAGNOSIS — G8929 Other chronic pain: Secondary | ICD-10-CM | POA: Diagnosis not present

## 2023-07-29 DIAGNOSIS — M5416 Radiculopathy, lumbar region: Secondary | ICD-10-CM | POA: Insufficient documentation

## 2023-07-29 MED ORDER — DIAZEPAM 5 MG PO TABS
5.0000 mg | ORAL_TABLET | ORAL | Status: AC
  Administered 2023-07-29: 5 mg via ORAL

## 2023-07-29 MED ORDER — SODIUM CHLORIDE 0.9% FLUSH
2.0000 mL | Freq: Once | INTRAVENOUS | Status: AC
Start: 2023-07-29 — End: 2023-07-29
  Administered 2023-07-29: 2 mL

## 2023-07-29 MED ORDER — LIDOCAINE HCL 2 % IJ SOLN
20.0000 mL | Freq: Once | INTRAMUSCULAR | Status: AC
  Administered 2023-07-29: 100 mg
  Filled 2023-07-29: qty 40

## 2023-07-29 MED ORDER — DIAZEPAM 5 MG PO TABS
ORAL_TABLET | ORAL | Status: AC
  Filled 2023-07-29: qty 1

## 2023-07-29 MED ORDER — DEXAMETHASONE SODIUM PHOSPHATE 10 MG/ML IJ SOLN
10.0000 mg | Freq: Once | INTRAMUSCULAR | Status: AC
  Administered 2023-07-29: 10 mg
  Filled 2023-07-29: qty 1

## 2023-07-29 MED ORDER — IOHEXOL 180 MG/ML  SOLN
10.0000 mL | Freq: Once | INTRAMUSCULAR | Status: AC
Start: 2023-07-29 — End: 2023-07-29
  Administered 2023-07-29: 10 mL via EPIDURAL
  Filled 2023-07-29: qty 20

## 2023-07-29 MED ORDER — ROPIVACAINE HCL 2 MG/ML IJ SOLN
2.0000 mL | Freq: Once | INTRAMUSCULAR | Status: AC
  Administered 2023-07-29: 2 mL via EPIDURAL
  Filled 2023-07-29: qty 20

## 2023-07-29 NOTE — Progress Notes (Signed)
 Safety precautions to be maintained throughout the outpatient stay will include: orient to surroundings, keep bed in low position, maintain call bell within reach at all times, provide assistance with transfer out of bed and ambulation.

## 2023-07-29 NOTE — Patient Instructions (Signed)

## 2023-07-29 NOTE — Progress Notes (Signed)
 PROVIDER NOTE: Interpretation of information contained herein should be left to medically-trained personnel. Specific patient instructions are provided elsewhere under Patient Instructions section of medical record. This document was created in part using STT-dictation technology, any transcriptional errors that may result from this process are unintentional.  Patient: Audrey [name] Type: Established DOB: 02/26/1951 MRN: [id number] PCP: Rollene Almarie LABOR, MD  Service: Procedure DOS: 07/29/2023 Setting: Ambulatory Location: Ambulatory outpatient facility Delivery: Face-to-face Provider: Wallie Sherry, MD Specialty: Interventional Pain Management Specialty designation: 09 Location: Outpatient facility Ref. Prov.: Rollene Almarie A, *       Interventional Therapy   Type: Lumbar epidural steroid injection (LESI) (interlaminar) #1    Laterality: Right   Level:  L3-4 Level.  Imaging: Fluoroscopic guidance         Anesthesia: Local anesthesia (1-2% Lidocaine ) Sedation: Minimal Sedation                       DOS: 07/29/2023  Performed by: Wallie Sherry, MD  Purpose: Diagnostic/Therapeutic Indications: Lumbar radicular pain of intraspinal etiology of more than 4 weeks that has failed to respond to conservative therapy and is severe enough to impact quality of life or function. 1. Spinal stenosis, lumbar region, with neurogenic claudication   2. Lumbar radiculopathy   3. Chronic radicular lumbar pain    NAS-11 Pain score:   Pre-procedure: 5 /10   Post-procedure: 5 /10      Position / Prep / Materials:  Position: Prone w/ head of the table raised (slight reverse trendelenburg) to facilitate breathing.  Prep solution: ChloraPrep (2% chlorhexidine  gluconate and 70% isopropyl alcohol) Prep Area: Entire Posterior Lumbar Region from lower scapular tip down to mid buttocks area and from flank to flank. Materials:  Tray: Epidural tray Needle(s):  Type: Epidural needle (Tuohy) Gauge  (G):  22 Length: Regular (3.5-in) Qty: 1   H&P (Pre-op Assessment):  Audrey [name] is a 73 y.o. (year old), female patient, seen today for interventional treatment. She  has a past surgical history that includes Tonsillectomy (1958); Pilonidal cyst excision (1972); Cesarean section (1987, 1989); Dilation and curettage of uterus (1986); Total hip arthroplasty (Right, 10/16/2015); Total hip arthroplasty (Left, 08/19/2016); and Hallux fusion (Right, 05/05/2022). Audrey [name] has a current medication list which includes the following prescription(s): amlodipine -valsartan , atorvastatin , desvenlafaxine , gabapentin , ibuprofen , metoprolol  succinate, mirtazapine , and tramadol . Her primarily concern today is the Back Pain (lower)  Initial Vital Signs:  Pulse/HCG Rate: 62ECG Heart Rate: (!) 59 Temp: 98.1 F (36.7 C) Resp: 18 BP: (!) 191/74 SpO2: 99 %  BMI: Estimated body mass index is 28.49 kg/m as calculated from the following:   Height as of this encounter: 5' 4 (1.626 m).   Weight as of this encounter: 166 lb (75.3 kg).  Risk Assessment: Allergies: Reviewed. She is allergic to zoloft [sertraline hcl], lisinopril, and wellbutrin [bupropion].  Allergy Precautions: None required Coagulopathies: Reviewed. None identified.  Blood-thinner therapy: None at this time Active Infection(s): Reviewed. None identified. Audrey [name] is afebrile  Site Confirmation: Audrey [name] was asked to confirm the procedure and laterality before marking the site Procedure checklist: Completed Consent: Before the procedure and under the influence of no sedative(s), amnesic(s), or anxiolytics, the patient was informed of the treatment options, risks and possible complications. To fulfill our ethical and legal obligations, as recommended by the American Medical Association's Code of Ethics, I have informed the patient of my clinical impression; the nature and purpose of the treatment or procedure; the risks, benefits, and  possible complications of the intervention; the alternatives, including doing nothing; the risk(s) and benefit(s) of the alternative treatment(s) or procedure(s); and the risk(s) and benefit(s) of doing nothing. The patient was provided information about the general risks and possible complications associated with the procedure. These may include, but are not limited to: failure to achieve desired goals, infection, bleeding, organ or nerve damage, allergic reactions, paralysis, and death. In addition, the patient was informed of those risks and complications associated to Spine-related procedures, such as failure to decrease pain; infection (i.e.: Meningitis, epidural or intraspinal abscess); bleeding (i.e.: epidural hematoma, subarachnoid hemorrhage, or any other type of intraspinal or peri-dural bleeding); organ or nerve damage (i.e.: Any type of peripheral nerve, nerve root, or spinal cord injury) with subsequent damage to sensory, motor, and/or autonomic systems, resulting in permanent pain, numbness, and/or weakness of one or several areas of the body; allergic reactions; (i.e.: anaphylactic reaction); and/or death. Furthermore, the patient was informed of those risks and complications associated with the medications. These include, but are not limited to: allergic reactions (i.e.: anaphylactic or anaphylactoid reaction(s)); adrenal axis suppression; blood sugar elevation that in diabetics may result in ketoacidosis or comma; water  retention that in patients with history of congestive heart failure may result in shortness of breath, pulmonary edema, and decompensation with resultant heart failure; weight gain; swelling or edema; medication-induced neural toxicity; particulate matter embolism and blood vessel occlusion with resultant organ, and/or nervous system infarction; and/or aseptic necrosis of one or more joints. Finally, the patient was informed that Medicine is not an exact science; therefore, there  is also the possibility of unforeseen or unpredictable risks and/or possible complications that may result in a catastrophic outcome. The patient indicated having understood very clearly. We have given the patient no guarantees and we have made no promises. Enough time was given to the patient to ask questions, all of which were answered to the patient's satisfaction. Audrey [name] has indicated that she wanted to continue with the procedure. Attestation: I, the ordering provider, attest that I have discussed with the patient the benefits, risks, side-effects, alternatives, likelihood of achieving goals, and potential problems during recovery for the procedure that I have provided informed consent. Date  Time: 07/29/2023 10:48 AM   Pre-Procedure Preparation:  Monitoring: As per clinic protocol. Respiration, ETCO2, SpO2, BP, heart rate and rhythm monitor placed and checked for adequate function Safety Precautions: Patient was assessed for positional comfort and pressure points before starting the procedure. Time-out: I initiated and conducted the Time-out before starting the procedure, as per protocol. The patient was asked to participate by confirming the accuracy of the Time Out information. Verification of the correct person, site, and procedure were performed and confirmed by me, the nursing staff, and the patient. Time-out conducted as per Joint Commission's Universal Protocol (UP.01.01.01). Time: 1139 Start Time: 1139 hrs.  Description/Narrative of Procedure:          Target: Epidural space via interlaminar opening, initially targeting the lower laminar border of the superior vertebral body. Region: Lumbar Approach: Percutaneous paravertebral  Rationale (medical necessity): procedure needed and proper for the diagnosis and/or treatment of the patient's medical symptoms and needs. Procedural Technique Safety Precautions: Aspiration looking for blood return was conducted prior to all  injections. At no point did we inject any substances, as a needle was being advanced. No attempts were made at seeking any paresthesias. Safe injection practices and needle disposal techniques used. Medications properly checked for expiration dates. SDV (single dose vial) medications used.  Description of the Procedure: Protocol guidelines were followed. The procedure needle was introduced through the skin, ipsilateral to the reported pain, and advanced to the target area. Bone was contacted and the needle walked caudad, until the lamina was cleared. The epidural space was identified using "loss-of-resistance technique" with 2-3 ml of PF-NaCl (0.9% NSS), in a 5cc LOR glass syringe.  Vitals:   07/29/23 1106 07/29/23 1139 07/29/23 1142  BP: (!) 191/74 (!) 161/79   Pulse: 62    Resp: 18 18 18   Temp: 98.1 F (36.7 C)    SpO2: 99% 98% 98%  Weight: 166 lb (75.3 kg)    Height: 5' 4 (1.626 m)      Start Time: 1139 hrs. End Time: 1142 hrs.  Imaging Guidance (Spinal):          Type of Imaging Technique: Fluoroscopy Guidance (Spinal) Indication(s): Fluoroscopy guidance for needle placement to enhance accuracy in procedures requiring precise needle localization for targeted delivery of medication in or near specific anatomical locations not easily accessible without such real-time imaging assistance. Exposure Time: Please see nurses notes. Contrast: Before injecting any contrast, we confirmed that the patient did not have an allergy to iodine, shellfish, or radiological contrast. Once satisfactory needle placement was completed at the desired level, radiological contrast was injected. Contrast injected under live fluoroscopy. No contrast complications. See chart for type and volume of contrast used. Fluoroscopic Guidance: I was personally present during the use of fluoroscopy. Tunnel Vision Technique used to obtain the best possible view of the target area. Parallax error corrected before commencing the  procedure. Direction-depth-direction technique used to introduce the needle under continuous pulsed fluoroscopy. Once target was reached, antero-posterior, oblique, and lateral fluoroscopic projection used confirm needle placement in all planes. Images permanently stored in EMR. Interpretation: I personally interpreted the imaging intraoperatively. Adequate needle placement confirmed in multiple planes. Appropriate spread of contrast into desired area was observed. No evidence of afferent or efferent intravascular uptake. No intrathecal or subarachnoid spread observed. Permanent images saved into the patient's record.  Antibiotic Prophylaxis:   Anti-infectives (From admission, onward)    None      Indication(s): None identified  Post-operative Assessment:  Post-procedure Vital Signs:  Pulse/HCG Rate: 62(!) 59 Temp: 98.1 F (36.7 C) Resp: 18 BP: (!) 161/79 SpO2: 98 %  EBL: None  Complications: No immediate post-treatment complications observed by team, or reported by patient.  Note: The patient tolerated the entire procedure well. A repeat set of vitals were taken after the procedure and the patient was kept under observation following institutional policy, for this type of procedure. Post-procedural neurological assessment was performed, showing return to baseline, prior to discharge. The patient was provided with post-procedure discharge instructions, including a section on how to identify potential problems. Should any problems arise concerning this procedure, the patient was given instructions to immediately contact us , at any time, without hesitation. In any case, we plan to contact the patient by telephone for a follow-up status report regarding this interventional procedure.  Comments:  No additional relevant information.  Plan of Care (POC)  Orders:  Orders Placed This Encounter  Procedures   DG PAIN CLINIC C-ARM 1-60 MIN NO REPORT    Intraoperative interpretation by  procedural physician at [facility].    Standing Status:   Standing    Number of Occurrences:   1    Reason for exam::   Assistance in needle guidance and placement for procedures requiring needle placement in or near specific  anatomical locations not easily accessible without such assistance.    Medications ordered for procedure: Meds ordered this encounter  Medications   iohexol  (OMNIPAQUE ) 180 MG/ML injection 10 mL    Must be Myelogram-compatible. If not available, you may substitute with a water -soluble, non-ionic, hypoallergenic, myelogram-compatible radiological contrast medium.   lidocaine  (XYLOCAINE ) 2 % (with pres) injection 400 mg   diazepam  (VALIUM ) tablet 5 mg    Make sure Flumazenil is available in the pyxis when using this medication. If oversedation occurs, administer 0.2 mg IV over 15 sec. If after 45 sec no response, administer 0.2 mg again over 1 min; may repeat at 1 min intervals; not to exceed 4 doses (1 mg)   ropivacaine  (PF) 2 mg/mL (0.2%) (NAROPIN ) injection 2 mL   sodium chloride  flush (NS) 0.9 % injection 2 mL   dexamethasone  (DECADRON ) injection 10 mg   Medications administered: We administered iohexol , lidocaine , diazepam , ropivacaine  (PF) 2 mg/mL (0.2%), sodium chloride  flush, and dexamethasone .  See the medical record for exact dosing, route, and time of administration.  Follow-up plan:   Return in about 4 weeks (around 08/26/2023) for PPE, F2F.       Right L3/4 ES    Recent Visits Date Type Provider Dept  07/21/23 Office Visit Marcelino Nurse, MD [facility]  Showing recent visits within past 90 days and meeting all other requirements Today's Visits Date Type Provider Dept  07/29/23 Procedure visit Marcelino Nurse, MD [facility]  Showing today's visits and meeting all other requirements Future Appointments Date Type Provider Dept  08/26/23 Appointment Marcelino Nurse, MD [facility]  Showing future appointments  within next 90 days and meeting all other requirements  Disposition: Discharge home  Discharge (Date  Time): 07/29/2023; 1155 hrs.   Primary Care Physician: Rollene Almarie LABOR, MD Location: [facility] Note by: Nurse Marcelino, MD (TTS technology used. I apologize for any typographical errors that were not detected and corrected.) Date: 07/29/2023; Time: 12:31 PM  Disclaimer:  Medicine is not an visual merchandiser. The only guarantee in medicine is that nothing is guaranteed. It is important to note that the decision to proceed with this intervention was based on the information collected from the patient. The Data and conclusions were drawn from the patient's questionnaire, the interview, and the physical examination. Because the information was provided in large part by the patient, it cannot be guaranteed that it has not been purposely or unconsciously manipulated. Every effort has been made to obtain as much relevant data as possible for this evaluation. It is important to note that the conclusions that lead to this procedure are derived in large part from the available data. Always take into account that the treatment will also be dependent on availability of resources and existing treatment guidelines, considered by other Pain Management Practitioners as being common knowledge and practice, at the time of the intervention. For Medico-Legal purposes, it is also important to point out that variation in procedural techniques and pharmacological choices are the acceptable norm. The indications, contraindications, technique, and results of the above procedure should only be interpreted and judged by a Board-Certified Interventional Pain Specialist with extensive familiarity and expertise in the same exact procedure and technique.

## 2023-07-30 ENCOUNTER — Telehealth: Payer: Self-pay

## 2023-07-30 NOTE — Telephone Encounter (Signed)
 Post procedure follow up.  LM

## 2023-08-26 ENCOUNTER — Ambulatory Visit
Payer: Medicare Other | Attending: Student in an Organized Health Care Education/Training Program | Admitting: Student in an Organized Health Care Education/Training Program

## 2023-08-26 ENCOUNTER — Encounter: Payer: Self-pay | Admitting: Student in an Organized Health Care Education/Training Program

## 2023-08-26 VITALS — BP 164/92 | HR 65 | Temp 97.3°F | Resp 16 | Ht 63.0 in | Wt 166.0 lb

## 2023-08-26 DIAGNOSIS — G894 Chronic pain syndrome: Secondary | ICD-10-CM | POA: Insufficient documentation

## 2023-08-26 DIAGNOSIS — M533 Sacrococcygeal disorders, not elsewhere classified: Secondary | ICD-10-CM | POA: Insufficient documentation

## 2023-08-26 DIAGNOSIS — M461 Sacroiliitis, not elsewhere classified: Secondary | ICD-10-CM | POA: Diagnosis not present

## 2023-08-26 DIAGNOSIS — M5416 Radiculopathy, lumbar region: Secondary | ICD-10-CM | POA: Insufficient documentation

## 2023-08-26 DIAGNOSIS — M48062 Spinal stenosis, lumbar region with neurogenic claudication: Secondary | ICD-10-CM | POA: Insufficient documentation

## 2023-08-26 DIAGNOSIS — M47816 Spondylosis without myelopathy or radiculopathy, lumbar region: Secondary | ICD-10-CM | POA: Insufficient documentation

## 2023-08-26 NOTE — Progress Notes (Signed)
 PROVIDER NOTE: Information contained herein reflects review and annotations entered in association with encounter. Interpretation of such information and data should be left to medically-trained personnel. Information provided to patient can be located elsewhere in the medical record under "Patient Instructions". Document created using STT-dictation technology, any transcriptional errors that may result from process are unintentional.    Patient: Audrey [name]  Service Category: E/M  Provider: Edward Jolly, MD  DOB: Apr 17, 1951  DOS: 08/26/2023  Referring Provider: Myrlene Broker, *  MRN: [id number]  Specialty: Interventional Pain Management  PCP: Myrlene Broker, MD  Type: Established Patient  Setting: Ambulatory outpatient    Location: Office  Delivery: Face-to-face     HPI  [name], a 73 y.o. year old female, is here today because of her Sacroiliac joint pain [M53.3]. Ms. Marin primary complain today is right buttock pain overlying joint   Pain Assessment: Severity of   is reported as a 4 /10. Location:    / . Onset:  . Quality:  . Timing:  . Modifying factor(s):  Marland Kitchen Vitals:  height is 5\' 3"  (1.6 m) and weight is 166 lb (75.3 kg). Her temperature is 97.3 F (36.3 C) (abnormal). Her blood pressure is 164/92 (abnormal) and her pulse is 65. Her respiration is 16 and oxygen saturation is 100%.  BMI: Estimated body mass index is 29.41 kg/m as calculated from the following:   Height as of this encounter: 5\' 3"  (1.6 m).   Weight as of this encounter: 166 lb (75.3 kg). Last encounter: 07/21/2023. Last procedure: 07/29/2023.  Reason for encounter: post-procedure evaluation and assessment.  Patient is also endorsing increased pain overlying her right buttock and right SI joint   Post-procedure evaluation    Type: Lumbar epidural steroid injection (LESI) (interlaminar) #1    Laterality: Right   Level:  L3-4 Level.  Imaging: Fluoroscopic guidance         Anesthesia:  Local anesthesia (1-2% Lidocaine) Sedation: Minimal Sedation                       DOS: 07/29/2023  Performed by: Edward Jolly, MD  Purpose: Diagnostic/Therapeutic Indications: Lumbar radicular pain of intraspinal etiology of more than 4 weeks that has failed to respond to conservative therapy and is severe enough to impact quality of life or function. 1. Spinal stenosis, lumbar region, with neurogenic claudication   2. Lumbar radiculopathy   3. Chronic radicular lumbar pain    NAS-11 Pain score:   Pre-procedure: 5 /10   Post-procedure: 5 /10     Effectiveness:  Initial hour after procedure: 100 %  Subsequent 4-6 hours post-procedure: 100 %  Analgesia past initial 6 hours: 50 %  Ongoing improvement:  Analgesic:  50% Function: Somewhat improved ROM: Somewhat improved   ROS  Constitutional: Denies any fever or chills Gastrointestinal: No reported hemesis, hematochezia, vomiting, or acute GI distress Musculoskeletal:  Right SI joint pain Neurological: No reported episodes of acute onset apraxia, aphasia, dysarthria, agnosia, amnesia, paralysis, loss of coordination, or loss of consciousness  Medication Review  amLODipine-valsartan, atorvastatin, desvenlafaxine, gabapentin, ibuprofen, metoprolol succinate, mirtazapine, and traMADol  History Review  Allergy: Ms. Sica is allergic to zoloft [sertraline hcl], lisinopril, and wellbutrin [bupropion]. Drug: Ms. Baranowski  reports current drug use. Drug: Marijuana. Alcohol:  reports current alcohol use of about 9.0 standard drinks of alcohol per week. Tobacco:  reports that she has been smoking cigarettes. She started smoking about 57 years ago. She has  a 54 pack-year smoking history. She has never used smokeless tobacco. Social: Ms. Otte  reports that she has been smoking cigarettes. She started smoking about 57 years ago. She has a 54 pack-year smoking history. She has never used smokeless tobacco. She reports current alcohol use  of about 9.0 standard drinks of alcohol per week. She reports current drug use. Drug: Marijuana. Medical:  has a past medical history of Anxiety, Arthritis, Chicken pox, Depression, Dizziness, Elevated liver enzymes, Fatty liver, Hyperlipidemia, Hypertension, Shingles, and Wears glasses. Surgical: Ms. Hulgan  has a past surgical history that includes Tonsillectomy (1958); Pilonidal cyst excision (1972); Cesarean section (1987, 1989); Dilation and curettage of uterus (1986); Total hip arthroplasty (Right, 10/16/2015); Total hip arthroplasty (Left, 08/19/2016); and Hallux fusion (Right, 05/05/2022). Family: family history includes Arthritis in her father and paternal grandmother; Colon cancer (age of onset: 76) in her maternal aunt; Heart disease in her maternal grandmother and paternal grandfather; Hyperlipidemia in her paternal grandmother; Hypertension in her paternal grandfather and paternal grandmother; Mental illness in her maternal aunt, mother, and paternal grandfather; Multiple sclerosis in her mother.  Laboratory Chemistry Profile   Renal Lab Results  Component Value Date   BUN 15 05/01/2023   CREATININE 0.71 05/01/2023   BCR NOT APPLICABLE 03/19/2018   GFR 85.46 05/01/2023   GFRAA >60 08/20/2016   GFRNONAA >60 08/20/2016    Hepatic Lab Results  Component Value Date   AST 16 05/01/2023   ALT 15 05/01/2023   ALBUMIN 4.7 05/01/2023   ALKPHOS 85 05/01/2023    Electrolytes Lab Results  Component Value Date   NA 139 05/01/2023   K 4.4 05/01/2023   CL 105 05/01/2023   CALCIUM 9.7 05/01/2023    Bone Lab Results  Component Value Date   VD25OH 49 03/19/2018    Inflammation (CRP: Acute Phase) (ESR: Chronic Phase) No results found for: "CRP", "ESRSEDRATE", "LATICACIDVEN"       Note: Above Lab results reviewed.   Physical Exam  General appearance: Well nourished, well developed, and well hydrated. In no apparent acute distress Mental status: Alert, oriented x 3 ([name],  place, & time)       Respiratory: No evidence of acute respiratory distress Eyes: PERLA Vitals: BP (!) 164/92 Comment: Pt took Cold medication  Pulse 65   Temp (!) 97.3 F (36.3 C)   Resp 16   Ht 5\' 3"  (1.6 m)   Wt 166 lb (75.3 kg)   SpO2 100%   BMI 29.41 kg/m  BMI: Estimated body mass index is 29.41 kg/m as calculated from the following:   Height as of this encounter: 5\' 3"  (1.6 m).   Weight as of this encounter: 166 lb (75.3 kg). Ideal: Ideal body weight: 52.4 kg (115 lb 8.3 oz) Adjusted ideal body weight: 61.6 kg (135 lb 11.4 oz)  Lumbar Spine Area Exam  Skin & Axial Inspection: No masses, redness, or swelling Alignment: Symmetrical Functional ROM: Improved after treatment       Stability: No instability detected Muscle Tone/Strength: Functionally intact. No obvious neuro-muscular anomalies detected. Sensory (Neurological): Improved Palpation: Tender  over SIJ Provocative Tests:  Patrick's Maneuver: (+) for right-sided S-I arthralgia FABER* test: (+) for right-sided S-I arthralgia S-I anterior distraction/compression test: (+) for right-sided S-I arthralgia S-I lateral compression test: (+) for right-sided S-I arthralgia S-I Thigh-thrust test: (+) for right-sided S-I arthralgia S-I Gaenslen's test: (+) for right-sided S-I arthralgia *(Flexion, ABduction and External Rotation) Gait & Posture Assessment  Ambulation: Unassisted Gait: Relatively normal for  age and body habitus Posture: WNL  Lower Extremity Exam    Side: Right lower extremity  Side: Left lower extremity  Stability: No instability observed          Stability: No instability observed          Skin & Extremity Inspection: Skin color, temperature, and hair growth are WNL. No peripheral edema or cyanosis. No masses, redness, swelling, asymmetry, or associated skin lesions. No contractures.  Skin & Extremity Inspection: Skin color, temperature, and hair growth are WNL. No peripheral edema or cyanosis. No masses,  redness, swelling, asymmetry, or associated skin lesions. No contractures.  Functional ROM: Unrestricted ROM                  Functional ROM: Unrestricted ROM                  Muscle Tone/Strength: Functionally intact. No obvious neuro-muscular anomalies detected.  Muscle Tone/Strength: Functionally intact. No obvious neuro-muscular anomalies detected.  Sensory (Neurological): Unimpaired        Sensory (Neurological): Unimpaired        DTR: Patellar: deferred today Achilles: deferred today Plantar: deferred today  DTR: Patellar: deferred today Achilles: deferred today Plantar: deferred today  Palpation: No palpable anomalies  Palpation: No palpable anomalies    Assessment   Diagnosis Status  1. Sacroiliac joint pain   2. SI joint arthritis (HCC)   3. Lumbar facet arthropathy   4. Lumbar spondylosis   5. Chronic pain syndrome   6. Spinal stenosis, lumbar region, with neurogenic claudication   7. Lumbar radiculopathy    Having a Flare-up Having a Flare-up Controlled   Updated Problems: Problem  Lumbar Facet Arthropathy    Plan of Care  Caidance is presenting with low back/gluteal pain consistent with sacroiliac (SI) joint dysfunction, supported by:  Localized tenderness over the SI joint Positive provocative tests (>=3), including FABER, Gaenslen's, sacral thrust, compression, distraction, and thigh thrust Improvement in  radicular symptoms  after L-ESI as detailed above  Differential includes SI joint dysfunction, piriformis syndrome, lumbar facet-mediated pain, and hip pathology. She does have a hx of bilateral hip replacement   Given the clinical findings, SI joint dysfunction and piriformis involvement are the leading diagnoses.  Plan: 1. Diagnostic & Therapeutic Interventions: Fluoroscopy-guided SI joint injection  Local anesthetic + corticosteroid  Goal: Diagnostic confirmation and therapeutic relief  2. Conservative Management: Physical therapy: Reviewed SI  joint stabilization exercises, piriformis stretching NSAIDs/analgesics: Short-term for symptom relief Activity modifications: Avoid prolonged sitting, excessive lumbar flexion/rotation  Plan for SI-J and Piriformis Injection  Assess pain relief duration: If short-lived, consider radiofrequency ablation (RFA) of SI joint nerves  Further workup if needed: MRI pelvis/lumbar spine if pain persists despite targeted interventions No orders of the defined types were placed in this encounter.  Orders:  Orders Placed This Encounter  Procedures   SACROILIAC JOINT INJECTION    Standing Status:   Future    Expected Date:   09/26/2023    Expiration Date:   11/26/2023    Scheduling Instructions:     Side: RIGHT SIJ and right Piriformis TPI     Sedation: PO Valium     Timeframe: ASAP    Where will this procedure be performed?:   [facility]   Follow-up plan:   Return in about 1 month (around 09/28/2023) for Right SI-J and Right Piriformis, in clinic (PO Valium 5mg ).      Right L3/4 ES    Recent Visits  Date Type Provider Dept  07/29/23 Procedure visit Edward Jolly, MD [facility]  07/21/23 Office Visit Edward Jolly, MD [facility]  Showing recent visits within past 90 days and meeting all other requirements Today's Visits Date Type Provider Dept  08/26/23 Office Visit Edward Jolly, MD [facility]  Showing today's visits and meeting all other requirements Future Appointments Date Type Provider Dept  09/28/23 Appointment Edward Jolly, MD [facility]  Showing future appointments within next 90 days and meeting all other requirements  I discussed the assessment and treatment plan with the patient. The patient was provided an opportunity to ask questions and all were answered. The patient agreed with the plan and demonstrated an understanding of the instructions.  Patient advised to call back or seek an in-[name] evaluation if the symptoms or  condition worsens.  Duration of encounter: 30 minutes.  Total time on encounter, as per AMA guidelines included both the face-to-face and non-face-to-face time personally spent by the physician and/or other qualified health care professional(s) on the day of the encounter (includes time in activities that require the physician or other qualified health care professional and does not include time in activities normally performed by clinical staff). Physician's time may include the following activities when performed: Preparing to see the patient (e.g., pre-charting review of records, searching for previously ordered imaging, lab work, and nerve conduction tests) Review of prior analgesic pharmacotherapies. Reviewing PMP Interpreting ordered tests (e.g., lab work, imaging, nerve conduction tests) Performing post-procedure evaluations, including interpretation of diagnostic procedures Obtaining and/or reviewing separately obtained history Performing a medically appropriate examination and/or evaluation Counseling and educating the patient/family/caregiver Ordering medications, tests, or procedures Referring and communicating with other health care professionals (when not separately reported) Documenting clinical information in the electronic or other health record Independently interpreting results (not separately reported) and communicating results to the patient/ family/caregiver Care coordination (not separately reported)  Note by: Edward Jolly, MD Date: 08/26/2023; Time: 3:40 PM

## 2023-08-26 NOTE — Patient Instructions (Signed)
Sacroiliac (SI) Joint Injection Patient Information  Description: The sacroiliac joint connects the scrum (very low back and tailbone) to the ilium (a pelvic bone which also forms half of the hip joint).  Normally this joint experiences very little motion.  When this joint becomes inflamed or unstable low back and or hip and pelvis pain may result.  Injection of this joint with local anesthetics (numbing medicines) and steroids can provide diagnostic information and reduce pain.  This injection is performed with the aid of x-ray guidance into the tailbone area while you are lying on your stomach.   You may experience an electrical sensation down the leg while this is being done.  You may also experience numbness.  We also may ask if we are reproducing your normal pain during the injection.  Conditions which may be treated SI injection:  Low back, buttock, hip or leg pain  Preparation for the Injection:  Do not eat any solid food or dairy products within 8 hours of your appointment.  You may drink clear liquids up to 3 hours before appointment.  Clear liquids include water, black coffee, juice or soda.  No milk or cream please. You may take your regular medications, including pain medications with a sip of water before your appointment.  Diabetics should hold regular insulin (if take separately) and take 1/2 normal NPH dose the morning of the procedure.  Carry some sugar containing items with you to your appointment. A driver must accompany you and be prepared to drive you home after your procedure. Bring all of your current medications with you. An IV may be inserted and sedation may be given at the discretion of the physician. A blood pressure cuff, EKG and other monitors will often be applied during the procedure.  Some patients may need to have extra oxygen administered for a short period.  You will be asked to provide medical information, including your allergies, prior to the procedure.  We  must know immediately if you are taking blood thinners (like Coumadin/Warfarin) or if you are allergic to IV iodine contrast (dye).  We must know if you could possible be pregnant.  Possible side effects:  Bleeding from needle site Infection (rare, may require surgery) Nerve injury (rare) Numbness & tingling (temporary) A brief convulsion or seizure Light-headedness (temporary) Pain at injection site (several days) Decreased blood pressure (temporary) Weakness in the leg (temporary)   Call if you experience:  New onset weakness or numbness of an extremity below the injection site that last more than 8 hours. Hives or difficulty breathing ( go to the emergency room) Inflammation or drainage at the injection site Any new symptoms which are concerning to you  Please note:  Although the local anesthetic injected can often make your back/ hip/ buttock/ leg feel good for several hours after the injections, the pain will likely return.  It takes 3-7 days for steroids to work in the sacroiliac area.  You may not notice any pain relief for at least that one week.  If effective, we will often do a series of three injections spaced 3-6 weeks apart to maximally decrease your pain.  After the initial series, we generally will wait some months before a repeat injection of the same type.  If you have any questions, please call [phone] [facility]   

## 2023-09-01 NOTE — Progress Notes (Signed)
 Referring Physician:  Myrlene Broker, MD [address] [place],  [place] [address]  Primary Physician:  Myrlene Broker, MD  History of Present Illness: [name] has a history of HTN, anxiety, depression, and hyperlipidemia.   Last seen by me on 07/16/23 for right sided back and buttock pain. She has known lumbar spondylosis with retrolisthesis L1-L2 and L2-L3, spondylolisthesis L4-L5 and L5-S1. She has moderate central stenosis L2-L3 and mild/moderate central stenosis L4-L5 along with multilevel foraminal stenosis. No instability seen on xrays.    LBP likely due to slips/facet hypertrophy L4-S1. She was referred to Dr. Cherylann Ratel to consider injections. PT recommended and she declined.   Dr. Cherylann Ratel started her on neurontin and she had right L3-L4 IL ESI on 07/29/23. He saw her back on 08/26/23 and discussed right SI joint/piriformis injection. This is scheduled for 09/28/23.   She is here for follow up.   Since her lumbar ESI, her right buttock pain has improved, but she now has constant right lateral hip pain. She is walking better (can do a few miles a day) and this makes her pain worse. She has some relief with bending forward and touching her toes. Still with numbness and tingling in left hip- she's had this since hip surgery. No weakness.   Stopped neurontin- did not help much. She is taking prn advil.   She smokes 1/2 PPD x 30 years.   Bowel/Bladder Dysfunction: none  Conservative measures:  Physical therapy: did  2-3 years ago with some improvement.   Multimodal medical therapy including regular antiinflammatories: prednisone, ultram, celebrex, zanaflex Injections:  right L3-L4 IL ESI on 07/29/23  Past Surgery: no spinal surgery  Audrey [name] has no symptoms of cervical myelopathy.  The symptoms are causing a significant impact on the patient's life.   Review of Systems:  A 10 point review of systems is negative, except for the pertinent positives and  negatives detailed in the HPI.  Past Medical History: Past Medical History:  Diagnosis Date   Anxiety    Arthritis    Chicken pox    Depression    Dizziness    when bending over    Elevated liver enzymes    Fatty liver    Hyperlipidemia    Hypertension    Shingles    Wears glasses     Past Surgical History: Past Surgical History:  Procedure Laterality Date   CESAREAN SECTION  1987, 8   DILATION AND CURETTAGE OF UTERUS  1986   HALLUX FUSION Right 05/05/2022   Procedure: Carvel Getting, FIRST METATASALPHALANGEAL JOINT;  Surgeon: Candelaria Stagers, DPM;  Location: [facility];  Service: Podiatry;  Laterality: Right;  CHOICE WITH BLOCK   PILONIDAL CYST EXCISION  1972   TONSILLECTOMY  1958   TOTAL HIP ARTHROPLASTY Right 10/16/2015   Procedure: RIGHT TOTAL HIP ARTHROPLASTY ANTERIOR APPROACH;  Surgeon: Durene Romans, MD;  Location: [facility];  Service: Orthopedics;  Laterality: Right;   TOTAL HIP ARTHROPLASTY Left 08/19/2016   Procedure: LEFT TOTAL HIP ARTHROPLASTY ANTERIOR APPROACH;  Surgeon: Durene Romans, MD;  Location: [facility];  Service: Orthopedics;  Laterality: Left;  requests 70 mins    Allergies: Allergies as of 09/10/2023 - Review Complete 09/10/2023  Allergen Reaction Noted   Zoloft [sertraline hcl] Diarrhea 11/13/2014   Lisinopril Rash 11/13/2014   Wellbutrin [bupropion] Rash 11/13/2014    Medications: Outpatient Encounter Medications as of 09/10/2023  Medication Sig   amLODipine-valsartan (EXFORGE) 5-320 MG tablet Take  1 tablet by mouth daily.   atorvastatin (LIPITOR) 20 MG tablet Take 1 tablet (20 mg total) by mouth at bedtime.   desvenlafaxine (PRISTIQ) 100 MG 24 hr tablet Take 1 tablet (100 mg total) by mouth daily.   ibuprofen (ADVIL) 200 MG tablet Take 600 mg by mouth every 6 (six) hours as needed for mild pain (pain score 1-3) or moderate pain (pain score 4-6). Reports that she takes 3 - 4 tablets daily as needed for pain.   metoprolol succinate  (TOPROL-XL) 100 MG 24 hr tablet Take 1 tablet (100 mg total) by mouth daily.   mirtazapine (REMERON) 15 MG tablet Take 1 tablet (15 mg total) by mouth at bedtime.   [DISCONTINUED] gabapentin (NEURONTIN) 100 MG capsule Take 1 capsule (100 mg total) by mouth 3 (three) times daily for 7 days, THEN 2 capsules (200 mg total) 3 (three) times daily for 7 days, THEN 3 capsules (300 mg total) 3 (three) times daily for 14 days.   [DISCONTINUED] traMADol (ULTRAM) 50 MG tablet Take 1 tablet (50 mg total) by mouth every 8 (eight) hours as needed for moderate pain (pain score 4-6). (Patient not taking: Reported on 07/21/2023)   No facility-administered encounter medications on file as of 09/10/2023.    Social History: Social History   Tobacco Use   Smoking status: Every Day    Current packs/day: 0.50    Average packs/day: 0.9 packs/day for 58.0 years (54.0 ttl pk-yrs)    Types: Cigarettes    Start date: 09/22/1965    Last attempt to quit: 09/23/2015   Smokeless tobacco: Never  Substance Use Topics   Alcohol use: Yes    Alcohol/week: 9.0 standard drinks of alcohol    Types: 3 Glasses of wine, 6 Cans of beer per week   Drug use: Yes    Types: Marijuana    Comment: gummies    Family Medical History: Family History  Problem Relation Age of Onset   Multiple sclerosis Mother    Mental illness Mother    Arthritis Father    Mental illness Maternal Aunt    Colon cancer Maternal Aunt 26   Heart disease Maternal Grandmother    Arthritis Paternal Grandmother    Hyperlipidemia Paternal Grandmother    Hypertension Paternal Grandmother    Heart disease Paternal Grandfather    Hypertension Paternal Grandfather    Mental illness Paternal Grandfather    Stomach cancer Neg Hx    Rectal cancer Neg Hx    Esophageal cancer Neg Hx     Physical Examination: Vitals:   09/10/23 1116 09/10/23 1146  BP: (!) 142/92 (!) 150/84      Awake, alert, oriented to person, place, and time.  Speech is clear and fluent.  Fund of knowledge is appropriate.   Cranial Nerves: Pupils equal round and reactive to light.  Facial tone is symmetric.    Mild tenderness over right SI joint.  No abnormal lesions on exposed skin.   Strength:  Side Iliopsoas Quads Hamstring PF DF EHL  R 5 5 5 5 5 5   L 5 5 5 5 5 5    Reflexes are 2+ and symmetric at the patella and achilles.     Bilateral lower extremity sensation is intact to light touch.     Gait is normal.    No pain with IR/ER of both hips.   Medical Decision Making  Imaging: None  Assessment and Plan: Since her lumbar ESI, her right buttock pain has improved, but she  now has constant right lateral hip pain. She is walking better (can do a few miles a day) and this makes her pain worse. She has some relief with bending forward and touching her toes.   She has known lumbar spondylosis with retrolisthesis L1-L2 and L2-L3, spondylolisthesis L4-L5 and L5-S1. She has moderate central stenosis L2-L3 and mild/moderate central stenosis L4-L5 along with multilevel foraminal stenosis.   Treatment options discussed with patient and following plan made:   - Scheduled for right SI and right piriformis injection with Dr. Cherylann Ratel on 09/28/23.  - Will message her a few weeks after injection to check on her progress.  - She will f/u prn.   I spent a total of 20 minutes in face-to-face and non-face-to-face activities related to this patient's care today including review of outside records, review of imaging, review of symptoms, physical exam, discussion of differential diagnosis, discussion of treatment options, and documentation.   Drake Leach PA-C Dept. of Neurosurgery

## 2023-09-10 ENCOUNTER — Encounter: Payer: Self-pay | Admitting: Orthopedic Surgery

## 2023-09-10 ENCOUNTER — Ambulatory Visit: Payer: Medicare Other | Admitting: Orthopedic Surgery

## 2023-09-10 VITALS — BP 150/84 | Ht 63.0 in | Wt 159.2 lb

## 2023-09-10 DIAGNOSIS — M4316 Spondylolisthesis, lumbar region: Secondary | ICD-10-CM

## 2023-09-10 DIAGNOSIS — M47816 Spondylosis without myelopathy or radiculopathy, lumbar region: Secondary | ICD-10-CM | POA: Diagnosis not present

## 2023-09-10 DIAGNOSIS — M48061 Spinal stenosis, lumbar region without neurogenic claudication: Secondary | ICD-10-CM

## 2023-09-10 NOTE — Patient Instructions (Signed)
 It was so nice to see you today. Thank you so much for coming in.    I'm glad you are feeling better. I agree with the injections Dr. Cherylann Ratel has planned.   I will message you 2-3 weeks after your injection to check on you.   Your blood pressure was slightly elevated today. I want you to recheck it at home and follow up with your PCP if it remains high. If you have any chest pain, shortness of breath, blurry vision, or headaches then you need to go to ED.    Please do not hesitate to call if you have any questions or concerns. You can also message me in MyChart.   Drake Leach PA-C [phone]     The physicians and staff at [facility] are committed to providing excellent care. You may receive a survey asking for feedback about your experience at our office. We value you your feedback and appreciate you taking the time to to fill it out. The [facility] leadership team is also available to discuss your experience in person, feel free to contact us [phone].

## 2023-09-16 NOTE — Progress Notes (Unsigned)
 This patient is appearing on a report for being at risk of failing the adherence measure for cholesterol (statin) and hypertension (ACEi/ARB) medications this calendar year.   Medication: Amlodipine-Valsartan 5-320mg  daily  Last fill date: 05/15/2023 for 90 day supply  Medication: Atorvastatin 20mg  daily  Last fill date: 07/31/2023 for 90 day supply  Patient is now utilizing mail order to access medications. No intervention needed.   Verdene Rio, PharmD PGY1 Pharmacy Resident

## 2023-09-28 ENCOUNTER — Ambulatory Visit
Admission: RE | Admit: 2023-09-28 | Discharge: 2023-09-28 | Disposition: A | Discharge status: Home / Self Care | Source: Ambulatory Visit | Attending: Student in an Organized Health Care Education/Training Program | Admitting: Student in an Organized Health Care Education/Training Program

## 2023-09-28 ENCOUNTER — Ambulatory Visit
Attending: Student in an Organized Health Care Education/Training Program | Admitting: Student in an Organized Health Care Education/Training Program

## 2023-09-28 VITALS — BP 156/89 | HR 67 | Temp 97.0°F | Resp 18 | Ht 63.0 in | Wt 159.0 lb

## 2023-09-28 DIAGNOSIS — M461 Sacroiliitis, not elsewhere classified: Secondary | ICD-10-CM | POA: Insufficient documentation

## 2023-09-28 DIAGNOSIS — M533 Sacrococcygeal disorders, not elsewhere classified: Secondary | ICD-10-CM | POA: Insufficient documentation

## 2023-09-28 DIAGNOSIS — G5701 Lesion of sciatic nerve, right lower limb: Secondary | ICD-10-CM | POA: Insufficient documentation

## 2023-09-28 MED ORDER — ROPIVACAINE HCL 2 MG/ML IJ SOLN
9.0000 mL | Freq: Once | INTRAMUSCULAR | Status: AC
  Administered 2023-09-28: 9 mL via INTRA_ARTICULAR
  Filled 2023-09-28: qty 20

## 2023-09-28 MED ORDER — DIAZEPAM 5 MG PO TABS
ORAL_TABLET | ORAL | Status: AC
  Filled 2023-09-28: qty 1

## 2023-09-28 MED ORDER — DIAZEPAM 5 MG PO TABS
5.0000 mg | ORAL_TABLET | ORAL | Status: AC
  Administered 2023-09-28: 5 mg via ORAL

## 2023-09-28 MED ORDER — LIDOCAINE HCL 2 % IJ SOLN
20.0000 mL | Freq: Once | INTRAMUSCULAR | Status: AC
  Administered 2023-09-28: 100 mg
  Filled 2023-09-28: qty 40

## 2023-09-28 MED ORDER — IOHEXOL 180 MG/ML  SOLN
10.0000 mL | Freq: Once | INTRAMUSCULAR | Status: AC
  Administered 2023-09-28: 10 mL via INTRA_ARTICULAR
  Filled 2023-09-28: qty 20

## 2023-09-28 MED ORDER — METHYLPREDNISOLONE ACETATE 80 MG/ML IJ SUSP
80.0000 mg | Freq: Once | INTRAMUSCULAR | Status: AC
  Administered 2023-09-28: 80 mg via INTRA_ARTICULAR
  Filled 2023-09-28: qty 1

## 2023-09-28 NOTE — Progress Notes (Signed)
 PROVIDER NOTE: Interpretation of information contained herein should be left to medically-trained personnel. Specific patient instructions are provided elsewhere under "Patient Instructions" section of medical record. This document was created in part using STT-dictation technology, any transcriptional errors that may result from this process are unintentional.  Patient: Audrey [name] Type: Established DOB: 05-31-1951 MRN: [id number] PCP: Myrlene Broker, MD  Service: Procedure DOS: 09/28/2023 Setting: Ambulatory Location: Ambulatory outpatient facility Delivery: Face-to-face Provider: Edward Jolly, MD Specialty: Interventional Pain Management Specialty designation: 09 Location: Outpatient facility Ref. Prov.: Myrlene Broker, *       Interventional Therapy   Procedure: Sacroiliac Joint Steroid Injection #1 and Right Piriformis TPI    Laterality: Right     Level: PSIS (Posterior Inferior Iliac Spine)  Imaging: Fluoroscopic guidance Anesthesia: Local anesthesia (1-2% Lidocaine) Sedation: Minimal Sedation                       DOS: 09/28/2023  Performed by: Edward Jolly, MD  Purpose: Diagnostic/Therapeutic Indications: Sacroiliac joint pain in the lower back and hip area severe enough to impact quality of life or function. Rationale (medical necessity): procedure needed and proper for the diagnosis and/or treatment of Audrey [name]'s medical symptoms and needs. 1. Sacroiliac joint pain   2. SI joint arthritis (HCC)   3. Piriformis syndrome, right    NAS-11 Pain score:   Pre-procedure: 4/10   Post-procedure: 0-No pain/10     Target: Interarticular sacroiliac joint. Location: Medial to the postero-medial edge of iliac spine. Region: Lumbosacral-sacrococcygeal. Approach: Inferior postero-medial percutaneous approach. Type of procedure: Percutaneous joint injection.  Position / Prep / Materials:  Position: Prone  Prep solution: ChloraPrep (2% chlorhexidine gluconate  and 70% isopropyl alcohol) Prep Area: Entire posterior lumbosacral area  Materials:  Tray: Block Needle(s):  Type: Spinal  Gauge (G): 22  Length: 3.5-in Qty: 1  H&P (Pre-op Assessment):  Audrey [name] is a 73 y.o. (year old), female patient, seen today for interventional treatment. She  has a past surgical history that includes Tonsillectomy (1958); Pilonidal cyst excision (1972); Cesarean section (1987, 1989); Dilation and curettage of uterus (1986); Total hip arthroplasty (Right, 10/16/2015); Total hip arthroplasty (Left, 08/19/2016); and Hallux fusion (Right, 05/05/2022). Audrey [name] has a current medication list which includes the following prescription(s): amlodipine-valsartan, atorvastatin, desvenlafaxine, ibuprofen, metoprolol succinate, and mirtazapine. Her primarily concern today is the Hip Pain (right)  Initial Vital Signs:  Pulse/HCG Rate: 67ECG Heart Rate: 69 Temp: (!) 97 F (36.1 C) Resp: 16 BP: (!) 170/87 SpO2: 100 %  BMI: Estimated body mass index is 28.17 kg/m as calculated from the following:   Height as of this encounter: 5\' 3"  (1.6 m).   Weight as of this encounter: 159 lb (72.1 kg).  Risk Assessment: Allergies: Reviewed. She is allergic to zoloft [sertraline hcl], lisinopril, and wellbutrin [bupropion].  Allergy Precautions: None required Coagulopathies: Reviewed. None identified.  Blood-thinner therapy: None at this time Active Infection(s): Reviewed. None identified. Audrey [name] is afebrile  Site Confirmation: Audrey [name] was asked to confirm the procedure and laterality before marking the site Procedure checklist: Completed Consent: Before the procedure and under the influence of no sedative(s), amnesic(s), or anxiolytics, the patient was informed of the treatment options, risks and possible complications. To fulfill our ethical and legal obligations, as recommended by the American Medical Association's Code of Ethics, I have informed the patient of my  clinical impression; the nature and purpose of the treatment or procedure; the risks, benefits, and possible complications  of the intervention; the alternatives, including doing nothing; the risk(s) and benefit(s) of the alternative treatment(s) or procedure(s); and the risk(s) and benefit(s) of doing nothing. The patient was provided information about the general risks and possible complications associated with the procedure. These may include, but are not limited to: failure to achieve desired goals, infection, bleeding, organ or nerve damage, allergic reactions, paralysis, and death. In addition, the patient was informed of those risks and complications associated to the procedure, such as failure to decrease pain; infection; bleeding; organ or nerve damage with subsequent damage to sensory, motor, and/or autonomic systems, resulting in permanent pain, numbness, and/or weakness of one or several areas of the body; allergic reactions; (i.e.: anaphylactic reaction); and/or death. Furthermore, the patient was informed of those risks and complications associated with the medications. These include, but are not limited to: allergic reactions (i.e.: anaphylactic or anaphylactoid reaction(s)); adrenal axis suppression; blood sugar elevation that in diabetics may result in ketoacidosis or comma; water retention that in patients with history of congestive heart failure may result in shortness of breath, pulmonary edema, and decompensation with resultant heart failure; weight gain; swelling or edema; medication-induced neural toxicity; particulate matter embolism and blood vessel occlusion with resultant organ, and/or nervous system infarction; and/or aseptic necrosis of one or more joints. Finally, the patient was informed that Medicine is not an exact science; therefore, there is also the possibility of unforeseen or unpredictable risks and/or possible complications that may result in a catastrophic outcome. The  patient indicated having understood very clearly. We have given the patient no guarantees and we have made no promises. Enough time was given to the patient to ask questions, all of which were answered to the patient's satisfaction. Ms. Charbonneau has indicated that she wanted to continue with the procedure. Attestation: I, the ordering provider, attest that I have discussed with the patient the benefits, risks, side-effects, alternatives, likelihood of achieving goals, and potential problems during recovery for the procedure that I have provided informed consent. Date  Time: 09/28/2023 10:03 AM  Pre-Procedure Preparation:  Monitoring: As per clinic protocol. Respiration, ETCO2, SpO2, BP, heart rate and rhythm monitor placed and checked for adequate function Safety Precautions: Patient was assessed for positional comfort and pressure points before starting the procedure. Time-out: I initiated and conducted the "Time-out" before starting the procedure, as per protocol. The patient was asked to participate by confirming the accuracy of the "Time Out" information. Verification of the correct person, site, and procedure were performed and confirmed by me, the nursing staff, and the patient. "Time-out" conducted as per Joint Commission's Universal Protocol (UP.01.01.01). Time: 1056 Start Time: 1056 hrs.  Description/Narrative of Procedure:          Start Time: 1056 hrs.  Rationale (medical necessity): procedure needed and proper for the diagnosis and/or treatment of the patient's medical symptoms and needs. Procedural Technique Safety Precautions: Aspiration looking for blood return was conducted prior to all injections. At no point did we inject any substances, as a needle was being advanced. No attempts were made at seeking any paresthesias. Safe injection practices and needle disposal techniques used. Medications properly checked for expiration dates. SDV (single dose vial) medications used. Description  of the Procedure: Protocol guidelines were followed. The patient was assisted into a comfortable position. The target area was identified and the area prepped in the usual manner. Skin & deeper tissues infiltrated with local anesthetic. Appropriate amount of time allowed to pass for local anesthetics to take effect. The procedure needles  were then advanced to the target area. Proper needle placement secured. Negative aspiration confirmed. Solution injected in intermittent fashion, asking for systemic symptoms every 0.5cc of injectate. The needles were then removed and the area cleansed, making sure to leave some of the prepping solution back to take advantage of its long term bactericidal properties.  Technical description of procedure:  Fluoroscopy using a posterior anterior 45 degree angle from the midline aiming at the anterolateral aspect of the patient was used to find a direct path into the sacroiliac joint, the superior medial to posterior superior iliac spine.  The skin was marked where the desired target and the skin infiltrated with local anesthetics.  The procedure needle was then advanced until the joint was entered.  Once inside of the joint, we then proceeded to inject the desired solution.  10 cc solution made of 9 cc of 0.2% ropivacaine, 1 cc of methylprednisolone, 80 mg/cc.  5 cc injected into the right SI joint.   Afterwards a right  piriformis trigger point injection was done 1 cm inferior, 1 cm deep, 1 cm lateral to the inferior fissure of the SI joint.  Contrast was injected to confirm piriformis muscle striation.  While injecting, patient did not complain of any pain radiating down his leg.  5 cc of above nerve block solution injected into the right piriformis.     Vitals:   09/28/23 1047 09/28/23 1052 09/28/23 1057 09/28/23 1101  BP: (!) 149/78 (!) 157/109 (!) 154/87 (!) 156/89  Pulse:      Resp: 17 15 18 18   Temp:      SpO2: 99% 95% 97% 97%  Weight:      Height:          End Time: 1101 hrs.  Imaging Guidance (Non-Spinal):          Type of Imaging Technique: Fluoroscopy Guidance (Non-Spinal) Indication(s): Fluoroscopy guidance for needle placement to enhance accuracy in procedures requiring precise needle localization for targeted delivery of medication in or near specific anatomical locations not easily accessible without such real-time imaging assistance. Exposure Time: Please see nurses notes. Contrast: Before injecting any contrast, we confirmed that the patient did not have an allergy to iodine, shellfish, or radiological contrast. Once satisfactory needle placement was completed at the desired level, radiological contrast was injected. Contrast injected under live fluoroscopy. No contrast complications. See chart for type and volume of contrast used. Fluoroscopic Guidance: I was personally present during the use of fluoroscopy. "Tunnel Vision Technique" used to obtain the best possible view of the target area. Parallax error corrected before commencing the procedure. "Direction-depth-direction" technique used to introduce the needle under continuous pulsed fluoroscopy. Once target was reached, antero-posterior, oblique, and lateral fluoroscopic projection used confirm needle placement in all planes. Images permanently stored in EMR. Interpretation: I personally interpreted the imaging intraoperatively. Adequate needle placement confirmed in multiple planes. Appropriate spread of contrast into desired area was observed. No evidence of afferent or efferent intravascular uptake. Permanent images saved into the patient's record.  Post-operative Assessment:  Post-procedure Vital Signs:  Pulse/HCG Rate: 6763 Temp: (!) 97 F (36.1 C) Resp: 18 BP: (!) 156/89 SpO2: 97 %  EBL: None  Complications: No immediate post-treatment complications observed by team, or reported by patient.  Note: The patient tolerated the entire procedure well. A repeat set of vitals  were taken after the procedure and the patient was kept under observation following institutional policy, for this type of procedure. Post-procedural neurological assessment was performed, showing return  to baseline, prior to discharge. The patient was provided with post-procedure discharge instructions, including a section on how to identify potential problems. Should any problems arise concerning this procedure, the patient was given instructions to immediately contact us, at any time, without hesitation. In any case, we plan to contact the patient by telephone for a follow-up status report regarding this interventional procedure.  Comments:  No additional relevant information.  Plan of Care (POC)  Orders:  Orders Placed This Encounter  Procedures   DG PAIN CLINIC C-ARM 1-60 MIN NO REPORT    Intraoperative interpretation by procedural physician at [facility].    Standing Status:   Standing    Number of Occurrences:   1    Reason for exam::   Assistance in needle guidance and placement for procedures requiring needle placement in or near specific anatomical locations not easily accessible without such assistance.     Medications ordered for procedure: Meds ordered this encounter  Medications   iohexol (OMNIPAQUE) 180 MG/ML injection 10 mL    Must be Myelogram-compatible. If not available, you may substitute with a water-soluble, non-ionic, hypoallergenic, myelogram-compatible radiological contrast medium.   lidocaine (XYLOCAINE) 2 % (with pres) injection 400 mg   methylPREDNISolone acetate (DEPO-MEDROL) injection 80 mg   ropivacaine (PF) 2 mg/mL (0.2%) (NAROPIN) injection 9 mL   diazepam (VALIUM) tablet 5 mg    Make sure Flumazenil is available in the pyxis when using this medication. If oversedation occurs, administer 0.2 mg IV over 15 sec. If after 45 sec no response, administer 0.2 mg again over 1 min; may repeat at 1 min intervals; not to exceed 4 doses (1 mg)    Medications administered: We administered iohexol, lidocaine, methylPREDNISolone acetate, ropivacaine (PF) 2 mg/mL (0.2%), and diazepam.  See the medical record for exact dosing, route, and time of administration.  Follow-up plan:   Return in about 4 weeks (around 10/26/2023) for PPE, F2F.       Right L3/4 ESI 07/29/23; Right SIJ and piriformis 09/28/23     Recent Visits Date Type Provider Dept  08/26/23 Office Visit Edward Jolly, MD [facility]  07/29/23 Procedure visit Edward Jolly, MD [facility]  07/21/23 Office Visit Edward Jolly, MD [facility]  Showing recent visits within past 90 days and meeting all other requirements Today's Visits Date Type Provider Dept  09/28/23 Procedure visit Edward Jolly, MD [facility]  Showing today's visits and meeting all other requirements Future Appointments No visits were found meeting these conditions. Showing future appointments within next 90 days and meeting all other requirements  Disposition: Discharge home  Discharge (Date  Time): 09/28/2023; 1110 hrs.   Primary Care Physician: Myrlene Broker, MD Location: [facility] Note by: Edward Jolly, MD (TTS technology used. I apologize for any typographical errors that were not detected and corrected.) Date: 09/28/2023; Time: 11:03 AM  Disclaimer:  Medicine is not an Visual merchandiser. The only guarantee in medicine is that nothing is guaranteed. It is important to note that the decision to proceed with this intervention was based on the information collected from the patient. The Data and conclusions were drawn from the patient's questionnaire, the interview, and the physical examination. Because the information was provided in large part by the patient, it cannot be guaranteed that it has not been purposely or unconsciously manipulated. Every effort has been made to obtain as much relevant data as possible for this  evaluation. It is important to  note that the conclusions that lead to this procedure are derived in large part from the available data. Always take into account that the treatment will also be dependent on availability of resources and existing treatment guidelines, considered by other Pain Management Practitioners as being common knowledge and practice, at the time of the intervention. For Medico-Legal purposes, it is also important to point out that variation in procedural techniques and pharmacological choices are the acceptable norm. The indications, contraindications, technique, and results of the above procedure should only be interpreted and judged by a Board-Certified Interventional Pain Specialist with extensive familiarity and expertise in the same exact procedure and technique.

## 2023-09-28 NOTE — Progress Notes (Signed)
 Safety precautions to be maintained throughout the outpatient stay will include: orient to surroundings, keep bed in low position, maintain call bell within reach at all times, provide assistance with transfer out of bed and ambulation.

## 2023-09-28 NOTE — Patient Instructions (Signed)
Pain Management Discharge Instructions  General Discharge Instructions :  If you need to reach your doctor call: Monday-Friday 8:00 am - 4:00 pm at [phone] or toll free [phone].  After clinic hours [phone] to have operator reach doctor.  Bring all of your medication bottles to all your appointments in the pain clinic.  To cancel or reschedule your appointment with Pain Management please remember to call 24 hours in advance to avoid a fee.  Refer to the educational materials which you have been given on: General Risks, I had my Procedure. Discharge Instructions, Post Sedation.  Post Procedure Instructions:  The drugs you were given will stay in your system until tomorrow, so for the next 24 hours you should not drive, make any legal decisions or drink any alcoholic beverages.  You may eat anything you prefer, but it is better to start with liquids then soups and crackers, and gradually work up to solid foods.  Please notify your doctor immediately if you have any unusual bleeding, trouble breathing or pain that is not related to your normal pain.  Depending on the type of procedure that was done, some parts of your body may feel week and/or numb.  This usually clears up by tonight or the next day.  Walk with the use of an assistive device or accompanied by an adult for the 24 hours.  You may use ice on the affected area for the first 24 hours.  Put ice in a Ziploc bag and cover with a towel and place against area 15 minutes on 15 minutes off.  You may switch to heat after 24 hours.Sacroiliac (SI) Joint Injection Patient Information  Description: The sacroiliac joint connects the scrum (very low back and tailbone) to the ilium (a pelvic bone which also forms half of the hip joint).  Normally this joint experiences very little motion.  When this joint becomes inflamed or unstable low back and or hip and pelvis pain may result.  Injection of this joint with local anesthetics  (numbing medicines) and steroids can provide diagnostic information and reduce pain.  This injection is performed with the aid of x-ray guidance into the tailbone area while you are lying on your stomach.   You may experience an electrical sensation down the leg while this is being done.  You may also experience numbness.  We also may ask if we are reproducing your normal pain during the injection.  Conditions which may be treated SI injection:  Low back, buttock, hip or leg pain  Preparation for the Injection:  Do not eat any solid food or dairy products within 8 hours of your appointment.  You may drink clear liquids up to 3 hours before appointment.  Clear liquids include water, black coffee, juice or soda.  No milk or cream please. You may take your regular medications, including pain medications with a sip of water before your appointment.  Diabetics should hold regular insulin (if take separately) and take 1/2 normal NPH dose the morning of the procedure.  Carry some sugar containing items with you to your appointment. A driver must accompany you and be prepared to drive you home after your procedure. Bring all of your current medications with you. An IV may be inserted and sedation may be given at the discretion of the physician. A blood pressure cuff, EKG and other monitors will often be applied during the procedure.  Some patients may need to have extra oxygen administered for a short period.  You will   be asked to provide medical information, including your allergies, prior to the procedure.  We must know immediately if you are taking blood thinners (like Coumadin/Warfarin) or if you are allergic to IV iodine contrast (dye).  We must know if you could possible be pregnant.  Possible side effects:  Bleeding from needle site Infection (rare, may require surgery) Nerve injury (rare) Numbness & tingling (temporary) A brief convulsion or seizure Light-headedness (temporary) Pain at  injection site (several days) Decreased blood pressure (temporary) Weakness in the leg (temporary)   Call if you experience:  New onset weakness or numbness of an extremity below the injection site that last more than 8 hours. Hives or difficulty breathing ( go to the emergency room) Inflammation or drainage at the injection site Any new symptoms which are concerning to you  Please note:  Although the local anesthetic injected can often make your back/ hip/ buttock/ leg feel good for several hours after the injections, the pain will likely return.  It takes 3-7 days for steroids to work in the sacroiliac area.  You may not notice any pain relief for at least that one week.  If effective, we will often do a series of three injections spaced 3-6 weeks apart to maximally decrease your pain.  After the initial series, we generally will wait some months before a repeat injection of the same type.  If you have any questions, please call [phone] [facility]   

## 2023-09-29 ENCOUNTER — Telehealth: Payer: Self-pay | Admitting: *Deleted

## 2023-09-29 NOTE — Telephone Encounter (Signed)
 Attempted to call for post procedure follow-up. Message left.

## 2023-10-26 ENCOUNTER — Ambulatory Visit: Admitting: Student in an Organized Health Care Education/Training Program

## 2023-10-27 ENCOUNTER — Ambulatory Visit: Admitting: Student in an Organized Health Care Education/Training Program

## 2023-11-02 ENCOUNTER — Encounter: Payer: Self-pay | Admitting: Student in an Organized Health Care Education/Training Program

## 2023-11-02 ENCOUNTER — Encounter: Payer: Self-pay | Admitting: Internal Medicine

## 2023-11-03 ENCOUNTER — Encounter: Payer: Self-pay | Admitting: Student in an Organized Health Care Education/Training Program

## 2023-11-03 ENCOUNTER — Ambulatory Visit
Attending: Student in an Organized Health Care Education/Training Program | Admitting: Student in an Organized Health Care Education/Training Program

## 2023-11-03 VITALS — BP 178/84 | HR 71 | Temp 97.3°F | Resp 17 | Ht 63.0 in | Wt 159.0 lb

## 2023-11-03 DIAGNOSIS — G894 Chronic pain syndrome: Secondary | ICD-10-CM

## 2023-11-03 DIAGNOSIS — M461 Sacroiliitis, not elsewhere classified: Secondary | ICD-10-CM

## 2023-11-03 DIAGNOSIS — M533 Sacrococcygeal disorders, not elsewhere classified: Secondary | ICD-10-CM | POA: Insufficient documentation

## 2023-11-03 DIAGNOSIS — G5702 Lesion of sciatic nerve, left lower limb: Secondary | ICD-10-CM | POA: Diagnosis not present

## 2023-11-03 NOTE — Patient Instructions (Signed)
 ______________________________________________________________________    General Risks and Possible Complications  Patient Responsibilities: It is important that you read this as it is part of your informed consent. It is our duty to inform you of the risks and possible complications associated with treatments offered to you. It is your responsibility as a patient to read this and to ask questions about anything that is not clear or that you believe was not covered in this document.  Patient's Rights: You have the right to refuse treatment. You also have the right to change your mind, even after initially having agreed to have the treatment done. However, under this last option, if you wait until the last second to change your mind, you may be charged for the materials used up to that point.  Introduction: Medicine is not an Visual merchandiser. Everything in Medicine, including the lack of treatment(s), carries the potential for danger, harm, or loss (which is by definition: Risk). In Medicine, a complication is a secondary problem, condition, or disease that can aggravate an already existing one. All treatments carry the risk of possible complications. The fact that a side effects or complications occurs, does not imply that the treatment was conducted incorrectly. It must be clearly understood that these can happen even when everything is done following the highest safety standards.  No treatment: You can choose not to proceed with the proposed treatment alternative. The "PRO(s)" would include: avoiding the risk of complications associated with the therapy. The "CON(s)" would include: not getting any of the treatment benefits. These benefits fall under one of three categories: diagnostic; therapeutic; and/or palliative. Diagnostic benefits include: getting information which can ultimately lead to improvement of the disease or symptom(s). Therapeutic benefits are those associated with the successful  treatment of the disease. Finally, palliative benefits are those related to the decrease of the primary symptoms, without necessarily curing the condition (example: decreasing the pain from a flare-up of a chronic condition, such as incurable terminal cancer).  General Risks and Complications: These are associated to most interventional treatments. They can occur alone, or in combination. They fall under one of the following six (6) categories: no benefit or worsening of symptoms; bleeding; infection; nerve damage; allergic reactions; and/or death. No benefits or worsening of symptoms: In Medicine there are no guarantees, only probabilities. No healthcare provider can ever guarantee that a medical treatment will work, they can only state the probability that it may. Furthermore, there is always the possibility that the condition may worsen, either directly, or indirectly, as a consequence of the treatment. Bleeding: This is more common if the patient is taking a blood thinner, either prescription or over the counter (example: Goody Powders, Fish oil, Aspirin, Garlic, etc.), or if suffering a condition associated with impaired coagulation (example: Hemophilia, cirrhosis of the liver, low platelet counts, etc.). However, even if you do not have one on these, it can still happen. If you have any of these conditions, or take one of these drugs, make sure to notify your treating physician. Infection: This is more common in patients with a compromised immune system, either due to disease (example: diabetes, cancer, human immunodeficiency virus [HIV], etc.), or due to medications or treatments (example: therapies used to treat cancer and rheumatological diseases). However, even if you do not have one on these, it can still happen. If you have any of these conditions, or take one of these drugs, make sure to notify your treating physician. Nerve Damage: This is more common when the treatment is  an invasive one, but it  can also happen with the use of medications, such as those used in the treatment of cancer. The damage can occur to small secondary nerves, or to large primary ones, such as those in the spinal cord and brain. This damage may be temporary or permanent and it may lead to impairments that can range from temporary numbness to permanent paralysis and/or brain death. Allergic Reactions: Any time a substance or material comes in contact with our body, there is the possibility of an allergic reaction. These can range from a mild skin rash (contact dermatitis) to a severe systemic reaction (anaphylactic reaction), which can result in death. Death: In general, any medical intervention can result in death, most of the time due to an unforeseen complication. ______________________________________________________________________      ______________________________________________________________________    Preparing for your procedure  Appointments: If you think you may not be able to keep your appointment, call 24-48 hours in advance to cancel. We need time to make it available to others.  Procedure visits are for procedures only. During your procedure appointment there will be: NO Prescription Refills*. NO medication changes or discussions*. NO discussion of disability issues*. NO unrelated pain problem evaluations*. NO evaluations to order other pain procedures*. *These will be addressed at a separate and distinct evaluation encounter on the provider's evaluation schedule and not during procedure days.  Instructions: Food intake: Avoid eating anything solid for at least 8 hours prior to your procedure. Clear liquid intake: You may take clear liquids such as water up to 2 hours prior to your procedure. (No carbonated drinks. No soda.) Transportation: Unless otherwise stated by your physician, bring a driver. (Driver cannot be a Market researcher, Pharmacist, community, or any other form of public transportation.) Morning  Medicines: Except for blood thinners, take all of your other morning medications with a sip of water. Make sure to take your heart and blood pressure medicines. If your blood pressure's lower number is above 100, the case will be rescheduled. Blood thinners: Make sure to stop your blood thinners as instructed.  If you take a blood thinner, but were not instructed to stop it, call our office [phone] and ask to talk to a nurse. Not stopping a blood thinner prior to certain procedures could lead to serious complications. Diabetics on insulin: Notify the staff so that you can be scheduled 1st case in the morning. If your diabetes requires high dose insulin, take only  of your normal insulin dose the morning of the procedure and notify the staff that you have done so. Preventing infections: Shower with an antibacterial soap the morning of your procedure.  Build-up your immune system: Take 1000 mg of Vitamin C with every meal (3 times a day) the day prior to your procedure. Antibiotics: Inform the nursing staff if you are taking any antibiotics or if you have any conditions that may require antibiotics prior to procedures. (Example: recent joint implants)   Pregnancy: If you are pregnant make sure to notify the nursing staff. Not doing so may result in injury to the fetus, including death.  Sickness: If you have a cold, fever, or any active infections, call and cancel or reschedule your procedure. Receiving steroids while having an infection may result in complications. Arrival: You must be in the facility at least 30 minutes prior to your scheduled procedure. Tardiness: Your scheduled time is also the cutoff time. If you do not arrive at least 15 minutes prior to your procedure, you will  be rescheduled.  Children: Do not bring any children with you. Make arrangements to keep them home. Dress appropriately: There is always a possibility that your clothing may get soiled. Avoid long dresses. Valuables:  Do not bring any jewelry or valuables.  Reasons to call and reschedule or cancel your procedure: (Following these recommendations will minimize the risk of a serious complication.) Surgeries: Avoid having procedures within 2 weeks of any surgery. (Avoid for 2 weeks before or after any surgery). Flu Shots: Avoid having procedures within 2 weeks of a flu shots or . (Avoid for 2 weeks before or after immunizations). Barium: Avoid having a procedure within 7-10 days after having had a radiological study involving the use of radiological contrast. (Myelograms, Barium swallow or enema study). Heart attacks: Avoid any elective procedures or surgeries for the initial 6 months after a "Myocardial Infarction" (Heart Attack). Blood thinners: It is imperative that you stop these medications before procedures. Let us know if you if you take any blood thinner.  Infection: Avoid procedures during or within two weeks of an infection (including chest colds or gastrointestinal problems). Symptoms associated with infections include: Localized redness, fever, chills, night sweats or profuse sweating, burning sensation when voiding, cough, congestion, stuffiness, runny nose, sore throat, diarrhea, nausea, vomiting, cold or Flu symptoms, recent or current infections. It is specially important if the infection is over the area that we intend to treat. Heart and lung problems: Symptoms that may suggest an active cardiopulmonary problem include: cough, chest pain, breathing difficulties or shortness of breath, dizziness, ankle swelling, uncontrolled high or unusually low blood pressure, and/or palpitations. If you are experiencing any of these symptoms, cancel your procedure and contact your primary care physician for an evaluation.  Remember:  Regular Business hours are:  Monday to Thursday 8:00 AM to 4:00 PM  Provider's Schedule: Delano Metz, MD:  Procedure days: Tuesday and Thursday 7:30 AM to 4:00 PM  Edward Jolly, MD:  Procedure days: Monday and Wednesday 7:30 AM to 4:00 PM Last  Updated: 06/02/2023 ______________________________________________________________________     Sacroiliac (SI) Joint Injection Patient Information  Description: The sacroiliac joint connects the scrum (very low back and tailbone) to the ilium (a pelvic bone which also forms half of the hip joint).  Normally this joint experiences very little motion.  When this joint becomes inflamed or unstable low back and or hip and pelvis pain may result.  Injection of this joint with local anesthetics (numbing medicines) and steroids can provide diagnostic information and reduce pain.  This injection is performed with the aid of x-ray guidance into the tailbone area while you are lying on your stomach.   You may experience an electrical sensation down the leg while this is being done.  You may also experience numbness.  We also may ask if we are reproducing your normal pain during the injection.  Conditions which may be treated SI injection:  Low back, buttock, hip or leg pain  Preparation for the Injection:  Do not eat any solid food or dairy products within 8 hours of your appointment.  You may drink clear liquids up to 3 hours before appointment.  Clear liquids include water, black coffee, juice or soda.  No milk or cream please. You may take your regular medications, including pain medications with a sip of water before your appointment.  Diabetics should hold regular insulin (if take separately) and take 1/2 normal NPH dose the morning of the procedure.  Carry some sugar containing items with  you to your appointment. A driver must accompany you and be prepared to drive you home after your procedure. Bring all of your current medications with you. An IV may be inserted and sedation may be given at the discretion of the physician. A blood pressure cuff, EKG and other monitors will often be applied during the procedure.  Some  patients may need to have extra oxygen administered for a short period.  You will be asked to provide medical information, including your allergies, prior to the procedure.  We must know immediately if you are taking blood thinners (like Coumadin/Warfarin) or if you are allergic to IV iodine contrast (dye).  We must know if you could possible be pregnant.  Possible side effects:  Bleeding from needle site Infection (rare, may require surgery) Nerve injury (rare) Numbness & tingling (temporary) A brief convulsion or seizure Light-headedness (temporary) Pain at injection site (several days) Decreased blood pressure (temporary) Weakness in the leg (temporary)   Call if you experience:  New onset weakness or numbness of an extremity below the injection site that last more than 8 hours. Hives or difficulty breathing ( go to the emergency room) Inflammation or drainage at the injection site Any new symptoms which are concerning to you  Please note:  Although the local anesthetic injected can often make your back/ hip/ buttock/ leg feel good for several hours after the injections, the pain will likely return.  It takes 3-7 days for steroids to work in the sacroiliac area.  You may not notice any pain relief for at least that one week.  If effective, we will often do a series of three injections spaced 3-6 weeks apart to maximally decrease your pain.  After the initial series, we generally will wait some months before a repeat injection of the same type.  If you have any questions, please call [phone] [facility]

## 2023-11-03 NOTE — Progress Notes (Signed)
 Safety precautions to be maintained throughout the outpatient stay will include: orient to surroundings, keep bed in low position, maintain call bell within reach at all times, provide assistance with transfer out of bed and ambulation.   Since last procedure, pt fell and is now experiencing discomfort to left buttock which radiates down front of left leg to left knee.

## 2023-11-03 NOTE — Progress Notes (Signed)
 PROVIDER NOTE: Interpretation of information contained herein should be left to medically-trained personnel. Specific patient instructions are provided elsewhere under "Patient Instructions" section of medical record. This document was created in part using AI and STT-dictation technology, any transcriptional errors that may result from this process are unintentional.  Patient: Audrey [name]  Service: E/M   PCP: Adelia Homestead, MD  DOB: 04-25-1951  DOS: 11/03/2023  Provider: Cephus Collin, MD  MRN: [id number]  Delivery: Face-to-face  Specialty: Interventional Pain Management  Type: Established Patient  Setting: Ambulatory outpatient facility  Specialty designation: 09  Referring Prov.: Adelia Homestead, *  Location: Outpatient office facility       HPI  [name], a 73 y.o. year old female, is here today because of her Sacroiliac joint pain [M53.3]. Audrey [name] primary complain today is Pain (Right buttock)  Pain Assessment: Severity of Chronic pain is reported as a 1 /10. Location: Buttocks Right/ . Onset: More than a month ago. Quality: Aching, Dull. Timing: Intermittent. Modifying factor(s): sij injection, hip injection. Vitals:  height is 5\' 3"  (1.6 m) and weight is 159 lb (72.1 kg). Her temperature is 97.3 F (36.3 C) (abnormal). Her blood pressure is 178/84 (abnormal) and her pulse is 71. Her respiration is 17 and oxygen saturation is 100%.  BMI: Estimated body mass index is 28.17 kg/m as calculated from the following:   Height as of this encounter: 5\' 3"  (1.6 m).   Weight as of this encounter: 159 lb (72.1 kg). Last encounter: 08/26/2023. Last procedure: 09/28/2023.  Reason for encounter: post-procedure evaluation and assessment. - excellent response with RIGHT SIJ and pirifomris TPI, close to 99% pain relief. Patient fell and injured her left side and now having left sided SIJ and piriformis pain.   Discussed the use of AI scribe software for clinical note  transcription with the patient, who gave verbal consent to proceed.  History of Present Illness   Audrey [name] is a 73 year old female who presents with left-sided buttock pain radiating to the knee.  She experiences left-sided buttock pain radiating to the knee, causing knee instability. The pain is described as gnawing and is similar to previous right-sided pain, which was successfully treated with an injection. The pain is not intense and is improving.  The onset of the left-sided pain occurred after a fall a couple of weeks ago, although the fall was cushioned by a dog cage, minimizing impact. Previously, she received a SI joint  injection for the right side, which provided significant relief, allowing her to be pain-free except when walking long distances.  She is planning to travel to [place]  for the summer and expresses concern about managing her condition while away, as she is not confident in the medical care available there. She plans to [place] she has a family camp. Her daughter, who lives in [place], visits and works remotely from the camp.        Post-procedure evaluation    Procedure: Sacroiliac Joint Steroid Injection #1 and Right Piriformis TPI    Laterality: Right     Level: PSIS (Posterior Inferior Iliac Spine)  Imaging: Fluoroscopic guidance Anesthesia: Local anesthesia (1-2% Lidocaine ) Sedation: Minimal Sedation                       DOS: 09/28/2023  Performed by: Cephus Collin, MD  Purpose: Diagnostic/Therapeutic Indications: Sacroiliac joint pain in the lower back and  hip area severe enough to impact quality of life or function. Rationale (medical necessity): procedure needed and proper for the diagnosis and/or treatment of Audrey [name]'s medical symptoms and needs. 1. Sacroiliac joint pain   2. SI joint arthritis (HCC)   3. Piriformis syndrome, right    NAS-11 Pain score:   Pre-procedure: 4/10    Post-procedure: 0-No pain/10    Effectiveness:  Initial hour after procedure: 99 %  Subsequent 4-6 hours post-procedure: 99 %  Analgesia past initial 6 hours: 99 % (can now walk 2 miles without stopping! NEW random twinges when bending over)  Ongoing improvement:  Analgesic:  99% Function: Audrey [name] reports improvement in function ROM: Audrey [name] reports improvement in ROM   ROS  Constitutional: Denies any fever or chills Gastrointestinal: No reported hemesis, hematochezia, vomiting, or acute GI distress Musculoskeletal: left SI joint pain Neurological: No reported episodes of acute onset apraxia, aphasia, dysarthria, agnosia, amnesia, paralysis, loss of coordination, or loss of consciousness  Medication Review  amLODipine -valsartan , atorvastatin , desvenlafaxine , ibuprofen , metoprolol  succinate, and mirtazapine   History Review  Allergy: Audrey [name] is allergic to zoloft [sertraline hcl], lisinopril, and wellbutrin [bupropion]. Drug: Audrey [name]  reports current drug use. Drug: Marijuana. Alcohol:  reports current alcohol use of about 9.0 standard drinks of alcohol per week. Tobacco:  reports that she has been smoking cigarettes. She started smoking about 58 years ago. She has a 54.1 pack-year smoking history. She has never used smokeless tobacco. Social: Audrey [name]  reports that she has been smoking cigarettes. She started smoking about 58 years ago. She has a 54.1 pack-year smoking history. She has never used smokeless tobacco. She reports current alcohol use of about 9.0 standard drinks of alcohol per week. She reports current drug use. Drug: Marijuana. Medical:  has a past medical history of Anxiety, Arthritis, Chicken pox, Depression, Dizziness, Elevated liver enzymes, Fatty liver, Hyperlipidemia, Hypertension, Shingles, and Wears glasses. Surgical: Audrey [name]  has a past surgical history that includes Tonsillectomy (1958); Pilonidal cyst excision (1972); Cesarean  section (1987, 1989); Dilation and curettage of uterus (1986); Total hip arthroplasty (Right, 10/16/2015); Total hip arthroplasty (Left, 08/19/2016); and Hallux fusion (Right, 05/05/2022). Family: family history includes Arthritis in her father and paternal grandmother; Colon cancer (age of onset: 26) in her maternal aunt; Heart disease in her maternal grandmother and paternal grandfather; Hyperlipidemia in her paternal grandmother; Hypertension in her paternal grandfather and paternal grandmother; Mental illness in her maternal aunt, mother, and paternal grandfather; Multiple sclerosis in her mother.  Laboratory Chemistry Profile   Renal Lab Results  Component Value Date   BUN 15 05/01/2023   CREATININE 0.71 05/01/2023   BCR NOT APPLICABLE 03/19/2018   GFR 85.46 05/01/2023   GFRAA >60 08/20/2016   GFRNONAA >60 08/20/2016    Hepatic Lab Results  Component Value Date   AST 16 05/01/2023   ALT 15 05/01/2023   ALBUMIN 4.7 05/01/2023   ALKPHOS 85 05/01/2023    Electrolytes Lab Results  Component Value Date   NA 139 05/01/2023   K 4.4 05/01/2023   CL 105 05/01/2023   CALCIUM  9.7 05/01/2023    Bone Lab Results  Component Value Date   VD25OH 49 03/19/2018    Inflammation (CRP: Acute Phase) (ESR: Chronic Phase) No results found for: "CRP", "ESRSEDRATE", "LATICACIDVEN"       Note: Above Lab results reviewed.  Recent Imaging Review  DG PAIN CLINIC C-ARM 1-60 MIN NO REPORT Fluoro was used, but no Radiologist interpretation will be provided.  Please refer to "NOTES" tab for provider progress note. Note: Reviewed         Physical Exam  General appearance: Well nourished, well developed, and well hydrated. In no apparent acute distress Mental status: Alert, oriented x 3 (person, place, & time)       Respiratory: No evidence of acute respiratory distress Eyes: PERLA Vitals: BP (!) 178/84   Pulse 71   Temp (!) 97.3 F (36.3 C)   Resp 17   Ht 5\' 3"  (1.6 m)   Wt 159 lb (72.1 kg)    SpO2 100%   BMI 28.17 kg/m  BMI: Estimated body mass index is 28.17 kg/m as calculated from the following:   Height as of this encounter: 5\' 3"  (1.6 m).   Weight as of this encounter: 159 lb (72.1 kg). Ideal: Ideal body weight: 52.4 kg (115 lb 8.3 oz) Adjusted ideal body weight: 60.3 kg (132 lb 14.6 oz)  Lumbar Spine Area Exam  Skin & Axial Inspection: No masses, redness, or swelling Alignment: Symmetrical Functional ROM: Improved after treatment       Stability: No instability detected Muscle Tone/Strength: Functionally intact. No obvious neuro-muscular anomalies detected. Sensory (Neurological): Improved Palpation: Tender over SIJ Provocative Tests:   Patrick's Maneuver: (+) for left-sided S-I arthralgia FABER* test: (+) for left-sided S-I arthralgia S-I anterior distraction/compression test: (+) for left-sided S-I arthralgia S-I lateral compression test: (+) for left-sided S-I arthralgia S-I Thigh-thrust test: (+) for left-sided S-I arthralgia S-I Gaenslen's test: (+) for left-sided S-I arthralgia *(Flexion, ABduction and External Rotation) Gait & Posture Assessment  Ambulation: Unassisted Gait: Relatively normal for age and body habitus Posture: WNL  Lower Extremity Exam      Side: Right lower extremity   Side: Left lower extremity  Stability: No instability observed           Stability: No instability observed          Skin & Extremity Inspection: Skin color, temperature, and hair growth are WNL. No peripheral edema or cyanosis. No masses, redness, swelling, asymmetry, or associated skin lesions. No contractures.   Skin & Extremity Inspection: Skin color, temperature, and hair growth are WNL. No peripheral edema or cyanosis. No masses, redness, swelling, asymmetry, or associated skin lesions. No contractures.  Functional ROM: Unrestricted ROM                   Functional ROM: Unrestricted ROM                  Muscle Tone/Strength: Functionally intact. No obvious  neuro-muscular anomalies detected.   Muscle Tone/Strength: Functionally intact. No obvious neuro-muscular anomalies detected.  Sensory (Neurological): Unimpaired         Sensory (Neurological): Unimpaired        DTR: Patellar: deferred today Achilles: deferred today Plantar: deferred today   DTR: Patellar: deferred today Achilles: deferred today Plantar: deferred today  Palpation: No palpable anomalies   Palpation: No palpable anomalies     Assessment   Diagnosis  1. Sacroiliac joint pain   2. SI joint arthritis (HCC)   3. Piriformis syndrome, left   4. Chronic pain syndrome      Updated Problems: Problem  Sacroiliac Joint Pain  Si Joint Arthritis (Hcc)  Piriformis Syndrome, Left    Plan of Care  1. Sacroiliac joint pain (Primary) - SACROILIAC JOINT INJECTION; Future  2. SI joint arthritis (HCC) - SACROILIAC JOINT INJECTION; Future  3. Piriformis syndrome, left - SACROILIAC JOINT INJECTION; Future  4. Chronic pain syndrome - SACROILIAC JOINT INJECTION; Future    Pharmacotherapy (Medications Ordered): No orders of the defined types were placed in this encounter.  Orders:  Orders Placed This Encounter  Procedures   SACROILIAC JOINT INJECTION    Physical Examination Findings: Positive Sacral Thrust (Sacral Spring, Downward Pressure): (Y) Positive FABER maneuver (Patrick's): (Y) Positive SI distraction (Gapping): (Y) Positive SI compression (Approximation): (Y) Positive Thigh Thrust:  (Y) Positive Gaenslen's: (Y) Positive Sacral Sulcus Tenderness: (Y)    Standing Status:   Future    Expected Date:   11/09/2023    Expiration Date:   02/03/2024    Scheduling Instructions:     Procedure: Sacroiliac Joint Injection and piriformis TPI     Side  Laterality: LEFT     Sedation: Patient's choice.     Timeframe: As soon as schedule allows.    Where will this procedure be performed?:   [facility]   Follow-up plan:   Return in about 6 days (around  11/09/2023) for Left SIJ and left Piriformis TPI, in clinic (PO Valium  5mg ).     Right L3/4 ESI 07/29/23; Right SIJ and piriformis 09/28/23     Recent Visits Date Type Provider Dept  09/28/23 Procedure visit Cephus Collin, MD [facility]  08/26/23 Office Visit Cephus Collin, MD [facility]  Showing recent visits within past 90 days and meeting all other requirements Today's Visits Date Type Provider Dept  11/03/23 Office Visit Cephus Collin, MD [facility]  Showing today's visits and meeting all other requirements Future Appointments Date Type Provider Dept  11/09/23 Appointment Cephus Collin, MD [facility]  Showing future appointments within next 90 days and meeting all other requirements   I discussed the assessment and treatment plan with the patient. The patient was provided an opportunity to ask questions and all were answered. The patient agreed with the plan and demonstrated an understanding of the instructions.  Patient advised to call back or seek an in-person evaluation if the symptoms or condition worsens.  Duration of encounter: .  Total time on encounter, as per AMA guidelines included both the face-to-face and non-face-to-face time personally spent by the physician and/or other qualified health care professional(s) on the day of the encounter (includes time in activities that require the physician or other qualified health care professional and does not include time in activities normally performed by clinical staff). Physician's time may include the following activities when performed: Preparing to see the patient (e.g., pre-charting review of records, searching for previously ordered imaging, lab work, and nerve conduction tests) Review of prior analgesic pharmacotherapies. Reviewing PMP Interpreting ordered tests (e.g., lab work, imaging, nerve conduction tests) Performing post-procedure evaluations, including interpretation  of diagnostic procedures Obtaining and/or reviewing separately obtained history Performing a medically appropriate examination and/or evaluation Counseling and educating the patient/family/caregiver Ordering medications, tests, or procedures Referring and communicating with other health care professionals (when not separately reported) Documenting clinical information in the electronic or other health record Independently interpreting results (not separately reported) and communicating results to the patient/ family/caregiver Care coordination (not separately reported)  Note by: Cephus Collin, MD (TTS and AI technology used. I apologize for any typographical errors that were not detected and corrected.) Date: 11/03/2023; Time: 11:03 AM

## 2023-11-04 NOTE — Telephone Encounter (Signed)
 Does patient need to go to urgent care or schedule a visit

## 2023-11-09 ENCOUNTER — Encounter: Payer: Self-pay | Admitting: Student in an Organized Health Care Education/Training Program

## 2023-11-09 ENCOUNTER — Ambulatory Visit
Attending: Student in an Organized Health Care Education/Training Program | Admitting: Student in an Organized Health Care Education/Training Program

## 2023-11-09 ENCOUNTER — Ambulatory Visit
Admission: RE | Admit: 2023-11-09 | Discharge: 2023-11-09 | Disposition: A | Discharge status: Home / Self Care | Source: Ambulatory Visit | Attending: Student in an Organized Health Care Education/Training Program | Admitting: Student in an Organized Health Care Education/Training Program

## 2023-11-09 VITALS — BP 125/81 | HR 64 | Temp 98.4°F | Resp 14 | Ht 63.0 in | Wt 159.0 lb

## 2023-11-09 DIAGNOSIS — M791 Myalgia, unspecified site: Secondary | ICD-10-CM | POA: Insufficient documentation

## 2023-11-09 DIAGNOSIS — M461 Sacroiliitis, not elsewhere classified: Secondary | ICD-10-CM | POA: Diagnosis not present

## 2023-11-09 DIAGNOSIS — G5702 Lesion of sciatic nerve, left lower limb: Secondary | ICD-10-CM | POA: Insufficient documentation

## 2023-11-09 DIAGNOSIS — M545 Low back pain, unspecified: Secondary | ICD-10-CM | POA: Diagnosis not present

## 2023-11-09 DIAGNOSIS — M533 Sacrococcygeal disorders, not elsewhere classified: Secondary | ICD-10-CM | POA: Insufficient documentation

## 2023-11-09 MED ORDER — DIAZEPAM 5 MG PO TABS
5.0000 mg | ORAL_TABLET | ORAL | Status: AC
  Administered 2023-11-09: 5 mg via ORAL

## 2023-11-09 MED ORDER — IOHEXOL 180 MG/ML  SOLN
10.0000 mL | Freq: Once | INTRAMUSCULAR | Status: AC
  Administered 2023-11-09: 10 mL via INTRA_ARTICULAR
  Filled 2023-11-09: qty 20

## 2023-11-09 MED ORDER — LIDOCAINE HCL 2 % IJ SOLN
20.0000 mL | Freq: Once | INTRAMUSCULAR | Status: AC
  Administered 2023-11-09: 400 mg
  Filled 2023-11-09: qty 20

## 2023-11-09 MED ORDER — DIAZEPAM 5 MG PO TABS
ORAL_TABLET | ORAL | Status: AC
  Filled 2023-11-09: qty 1

## 2023-11-09 MED ORDER — DEXAMETHASONE SODIUM PHOSPHATE 10 MG/ML IJ SOLN
INTRAMUSCULAR | Status: AC
  Filled 2023-11-09: qty 1

## 2023-11-09 MED ORDER — METHYLPREDNISOLONE ACETATE 80 MG/ML IJ SUSP
80.0000 mg | Freq: Once | INTRAMUSCULAR | Status: AC
  Administered 2023-11-09: 80 mg via INTRA_ARTICULAR
  Filled 2023-11-09: qty 1

## 2023-11-09 MED ORDER — ROPIVACAINE HCL 2 MG/ML IJ SOLN
9.0000 mL | Freq: Once | INTRAMUSCULAR | Status: AC
  Administered 2023-11-09: 9 mL via INTRA_ARTICULAR
  Filled 2023-11-09: qty 20

## 2023-11-09 NOTE — Progress Notes (Signed)
 PROVIDER NOTE: Interpretation of information contained herein should be left to medically-trained personnel. Specific patient instructions are provided elsewhere under "Patient Instructions" section of medical record. This document was created in part using STT-dictation technology, any transcriptional errors that may result from this process are unintentional.  Patient: Audrey [name] Type: Established DOB: May 07, 1951 MRN: [id number] PCP: Adelia Homestead, MD  Service: Procedure DOS: 11/09/2023 Setting: Ambulatory Location: Ambulatory outpatient facility Delivery: Face-to-face Provider: Cephus Collin, MD Specialty: Interventional Pain Management Specialty designation: 09 Location: Outpatient facility Ref. Prov.: Adelia Homestead, *       Interventional Therapy   Procedure: Sacroiliac Joint Steroid Injection #1 and LEFT Piriformis TPI  #1 Laterality: Left     Level: PSIS (Posterior Inferior Iliac Spine)  Imaging: Fluoroscopic guidance Anesthesia: Local anesthesia (1-2% Lidocaine ) Sedation: Minimal Sedation                       DOS: 11/09/2023  Performed by: Cephus Collin, MD  Purpose: Diagnostic/Therapeutic Indications: Sacroiliac joint pain in the lower back and hip area severe enough to impact quality of life or function. Rationale (medical necessity): procedure needed and proper for the diagnosis and/or treatment of Audrey [name]'s medical symptoms and needs. 1. Sacroiliac joint pain   2. SI joint arthritis (HCC)   3. Piriformis syndrome, left     NAS-11 Pain score:   Pre-procedure: 4/10   Post-procedure: 0-No pain/10     Target: Interarticular sacroiliac joint. Location: Medial to the postero-medial edge of iliac spine. Region: Lumbosacral-sacrococcygeal. Approach: Inferior postero-medial percutaneous approach. Type of procedure: Percutaneous joint injection.  Position / Prep / Materials:  Position: Prone  Prep solution: ChloraPrep (2% chlorhexidine  gluconate  and 70% isopropyl alcohol) Prep Area: Entire posterior lumbosacral area  Materials:  Tray: Block Needle(s):  Type: Spinal  Gauge (G): 22  Length: 3.5-in Qty: 1  H&P (Pre-op Assessment):  Audrey [name] is a 73 y.o. (year old), female patient, seen today for interventional treatment. She  has a past surgical history that includes Tonsillectomy (1958); Pilonidal cyst excision (1972); Cesarean section (1987, 1989); Dilation and curettage of uterus (1986); Total hip arthroplasty (Right, 10/16/2015); Total hip arthroplasty (Left, 08/19/2016); and Hallux fusion (Right, 05/05/2022). Audrey [name] has a current medication list which includes the following prescription(s): amlodipine -valsartan , atorvastatin , desvenlafaxine , ibuprofen , metoprolol  succinate, and mirtazapine . Her primarily concern today is the Other (Left buttocks )  Initial Vital Signs:  Pulse/HCG Rate: 64ECG Heart Rate: 60 Temp: 98.4 F (36.9 C) Resp: 16 BP: (!) 174/81 SpO2: 100 %  BMI: Estimated body mass index is 28.17 kg/m as calculated from the following:   Height as of this encounter: 5\' 3"  (1.6 m).   Weight as of this encounter: 159 lb (72.1 kg).  Risk Assessment: Allergies: Reviewed. She is allergic to zoloft [sertraline hcl], lisinopril, and wellbutrin [bupropion].  Allergy Precautions: None required Coagulopathies: Reviewed. None identified.  Blood-thinner therapy: None at this time Active Infection(s): Reviewed. None identified. Audrey [name] is afebrile  Site Confirmation: Audrey [name] was asked to confirm the procedure and laterality before marking the site Procedure checklist: Completed Consent: Before the procedure and under the influence of no sedative(s), amnesic(s), or anxiolytics, the patient was informed of the treatment options, risks and possible complications. To fulfill our ethical and legal obligations, as recommended by the American Medical Association's Code of Ethics, I have informed the patient of my  clinical impression; the nature and purpose of the treatment or procedure; the risks, benefits, and possible complications  of the intervention; the alternatives, including doing nothing; the risk(s) and benefit(s) of the alternative treatment(s) or procedure(s); and the risk(s) and benefit(s) of doing nothing. The patient was provided information about the general risks and possible complications associated with the procedure. These may include, but are not limited to: failure to achieve desired goals, infection, bleeding, organ or nerve damage, allergic reactions, paralysis, and death. In addition, the patient was informed of those risks and complications associated to the procedure, such as failure to decrease pain; infection; bleeding; organ or nerve damage with subsequent damage to sensory, motor, and/or autonomic systems, resulting in permanent pain, numbness, and/or weakness of one or several areas of the body; allergic reactions; (i.e.: anaphylactic reaction); and/or death. Furthermore, the patient was informed of those risks and complications associated with the medications. These include, but are not limited to: allergic reactions (i.e.: anaphylactic or anaphylactoid reaction(s)); adrenal axis suppression; blood sugar elevation that in diabetics may result in ketoacidosis or comma; water  retention that in patients with history of congestive heart failure may result in shortness of breath, pulmonary edema, and decompensation with resultant heart failure; weight gain; swelling or edema; medication-induced neural toxicity; particulate matter embolism and blood vessel occlusion with resultant organ, and/or nervous system infarction; and/or aseptic necrosis of one or more joints. Finally, the patient was informed that Medicine is not an exact science; therefore, there is also the possibility of unforeseen or unpredictable risks and/or possible complications that may result in a catastrophic outcome. The  patient indicated having understood very clearly. We have given the patient no guarantees and we have made no promises. Enough time was given to the patient to ask questions, all of which were answered to the patient's satisfaction. Ms. Orrego has indicated that she wanted to continue with the procedure. Attestation: I, the ordering provider, attest that I have discussed with the patient the benefits, risks, side-effects, alternatives, likelihood of achieving goals, and potential problems during recovery for the procedure that I have provided informed consent. Date  Time: 11/09/2023  1:39 PM  Pre-Procedure Preparation:  Monitoring: As per clinic protocol. Respiration, ETCO2, SpO2, BP, heart rate and rhythm monitor placed and checked for adequate function Safety Precautions: Patient was assessed for positional comfort and pressure points before starting the procedure. Time-out: I initiated and conducted the "Time-out" before starting the procedure, as per protocol. The patient was asked to participate by confirming the accuracy of the "Time Out" information. Verification of the correct person, site, and procedure were performed and confirmed by me, the nursing staff, and the patient. "Time-out" conducted as per Joint Commission's Universal Protocol (UP.01.01.01). Time: 1411 Start Time: 1411 hrs.  Description/Narrative of Procedure:          Start Time: 1411 hrs.  Rationale (medical necessity): procedure needed and proper for the diagnosis and/or treatment of the patient's medical symptoms and needs. Procedural Technique Safety Precautions: Aspiration looking for blood return was conducted prior to all injections. At no point did we inject any substances, as a needle was being advanced. No attempts were made at seeking any paresthesias. Safe injection practices and needle disposal techniques used. Medications properly checked for expiration dates. SDV (single dose vial) medications used. Description  of the Procedure: Protocol guidelines were followed. The patient was assisted into a comfortable position. The target area was identified and the area prepped in the usual manner. Skin & deeper tissues infiltrated with local anesthetic. Appropriate amount of time allowed to pass for local anesthetics to take effect. The procedure  needles were then advanced to the target area. Proper needle placement secured. Negative aspiration confirmed. Solution injected in intermittent fashion, asking for systemic symptoms every 0.5cc of injectate. The needles were then removed and the area cleansed, making sure to leave some of the prepping solution back to take advantage of its long term bactericidal properties.  Technical description of procedure:  Fluoroscopy using a posterior anterior 45 degree angle from the midline aiming at the anterolateral aspect of the patient was used to find a direct path into the sacroiliac joint, the superior medial to posterior superior iliac spine.  The skin was marked where the desired target and the skin infiltrated with local anesthetics.  The procedure needle was then advanced until the joint was entered.  Once inside of the joint, we then proceeded to inject the desired solution.  10 cc solution made of 9 cc of 0.2% ropivacaine , 1 cc of methylprednisolone , 80 mg/cc.  5 cc injected into the LEFT SI joint.   Afterwards a LEFT  piriformis trigger point injection was done 1 cm inferior, 1 cm deep, 1 cm lateral to the inferior fissure of the SI joint.  Contrast was injected to confirm piriformis muscle striation.  While injecting, patient did not complain of any pain radiating down his leg.  5 cc of above nerve block solution injected into the LEFT piriformis.     Vitals:   11/09/23 1345 11/09/23 1410 11/09/23 1415  BP: (!) 174/81 135/77 125/81  Pulse: 64    Resp: 16 (!) 24 14  Temp: 98.4 F (36.9 C)    TempSrc: Temporal    SpO2: 100% 99% 100%  Weight: 159 lb (72.1 kg)     Height: 5\' 3"  (1.6 m)        End Time: 1415 hrs.  Imaging Guidance (Non-Spinal):          Type of Imaging Technique: Fluoroscopy Guidance (Non-Spinal) Indication(s): Fluoroscopy guidance for needle placement to enhance accuracy in procedures requiring precise needle localization for targeted delivery of medication in or near specific anatomical locations not easily accessible without such real-time imaging assistance. Exposure Time: Please see nurses notes. Contrast: Before injecting any contrast, we confirmed that the patient did not have an allergy to iodine, shellfish, or radiological contrast. Once satisfactory needle placement was completed at the desired level, radiological contrast was injected. Contrast injected under live fluoroscopy. No contrast complications. See chart for type and volume of contrast used. Fluoroscopic Guidance: I was personally present during the use of fluoroscopy. "Tunnel Vision Technique" used to obtain the best possible view of the target area. Parallax error corrected before commencing the procedure. "Direction-depth-direction" technique used to introduce the needle under continuous pulsed fluoroscopy. Once target was reached, antero-posterior, oblique, and lateral fluoroscopic projection used confirm needle placement in all planes. Images permanently stored in EMR. Interpretation: I personally interpreted the imaging intraoperatively. Adequate needle placement confirmed in multiple planes. Appropriate spread of contrast into desired area was observed. No evidence of afferent or efferent intravascular uptake. Permanent images saved into the patient's record.  Post-operative Assessment:  Post-procedure Vital Signs:  Pulse/HCG Rate: 6461 Temp: 98.4 F (36.9 C) Resp: 14 BP: 125/81 SpO2: 100 %  EBL: None  Complications: No immediate post-treatment complications observed by team, or reported by patient.  Note: The patient tolerated the entire procedure well.  A repeat set of vitals were taken after the procedure and the patient was kept under observation following institutional policy, for this type of procedure. Post-procedural neurological assessment was  performed, showing return to baseline, prior to discharge. The patient was provided with post-procedure discharge instructions, including a section on how to identify potential problems. Should any problems arise concerning this procedure, the patient was given instructions to immediately contact us , at any time, without hesitation. In any case, we plan to contact the patient by telephone for a follow-up status report regarding this interventional procedure.  Comments:  No additional relevant information.  Plan of Care (POC)  Orders:  Orders Placed This Encounter  Procedures   DG PAIN CLINIC C-ARM 1-60 MIN NO REPORT    Intraoperative interpretation by procedural physician at [facility].    Standing Status:   Standing    Number of Occurrences:   1    Reason for exam::   Assistance in needle guidance and placement for procedures requiring needle placement in or near specific anatomical locations not easily accessible without such assistance.     Medications ordered for procedure: Meds ordered this encounter  Medications   iohexol  (OMNIPAQUE ) 180 MG/ML injection 10 mL    Must be Myelogram-compatible. If not available, you may substitute with a water -soluble, non-ionic, hypoallergenic, myelogram-compatible radiological contrast medium.   lidocaine  (XYLOCAINE ) 2 % (with pres) injection 400 mg   diazepam  (VALIUM ) tablet 5 mg    Make sure Flumazenil is available in the pyxis when using this medication. If oversedation occurs, administer 0.2 mg IV over 15 sec. If after 45 sec no response, administer 0.2 mg again over 1 min; may repeat at 1 min intervals; not to exceed 4 doses (1 mg)   ropivacaine  (PF) 2 mg/mL (0.2%) (NAROPIN ) injection 9 mL   methylPREDNISolone  acetate (DEPO-MEDROL )  injection 80 mg   Medications administered: We administered iohexol , lidocaine , diazepam , ropivacaine  (PF) 2 mg/mL (0.2%), and methylPREDNISolone  acetate.  See the medical record for exact dosing, route, and time of administration.  Follow-up plan:   Return in about 6 weeks (around 12/21/2023) for PPE, VV.       Right L3/4 ESI 07/29/23; Right SIJ and piriformis 09/28/23; Left SIJ and piriformis 11/09/23     Recent Visits Date Type Provider Dept  11/03/23 Office Visit Cephus Collin, MD [facility]  09/28/23 Procedure visit Cephus Collin, MD [facility]  08/26/23 Office Visit Cephus Collin, MD [facility]  Showing recent visits within past 90 days and meeting all other requirements Today's Visits Date Type Provider Dept  11/09/23 Procedure visit Cephus Collin, MD [facility]  Showing today's visits and meeting all other requirements Future Appointments Date Type Provider Dept  12/21/23 Appointment Cephus Collin, MD [facility]  Showing future appointments within next 90 days and meeting all other requirements  Disposition: Discharge home  Discharge (Date  Time): 11/09/2023; 1416 hrs.   Primary Care Physician: Adelia Homestead, MD Location: [facility] Note by: Cephus Collin, MD (TTS technology used. I apologize for any typographical errors that were not detected and corrected.) Date: 11/09/2023; Time: 2:27 PM  Disclaimer:  Medicine is not an Visual merchandiser. The only guarantee in medicine is that nothing is guaranteed. It is important to note that the decision to proceed with this intervention was based on the information collected from the patient. The Data and conclusions were drawn from the patient's questionnaire, the interview, and the physical examination. Because the information was provided in large part by the patient, it cannot be guaranteed that it has not been purposely or unconsciously  manipulated. Every effort has been made  to obtain as much relevant data as possible for this evaluation. It is important to note that the conclusions that lead to this procedure are derived in large part from the available data. Always take into account that the treatment will also be dependent on availability of resources and existing treatment guidelines, considered by other Pain Management Practitioners as being common knowledge and practice, at the time of the intervention. For Medico-Legal purposes, it is also important to point out that variation in procedural techniques and pharmacological choices are the acceptable norm. The indications, contraindications, technique, and results of the above procedure should only be interpreted and judged by a Board-Certified Interventional Pain Specialist with extensive familiarity and expertise in the same exact procedure and technique.

## 2023-11-09 NOTE — Patient Instructions (Signed)

## 2023-11-09 NOTE — Progress Notes (Signed)
 Safety precautions to be maintained throughout the outpatient stay will include: orient to surroundings, keep bed in low position, maintain call bell within reach at all times, provide assistance with transfer out of bed and ambulation.

## 2023-11-10 ENCOUNTER — Telehealth: Payer: Self-pay | Admitting: *Deleted

## 2023-11-10 DIAGNOSIS — K080 Exfoliation of teeth due to systemic causes: Secondary | ICD-10-CM | POA: Diagnosis not present

## 2023-11-10 NOTE — Telephone Encounter (Signed)
 Post procedure call; patient reports she is doing "awesome".  No questions or concerns.

## 2023-12-18 ENCOUNTER — Telehealth: Payer: Self-pay

## 2023-12-18 NOTE — Telephone Encounter (Signed)
 Attempted to call for pre virtual appt questions.  LM for patient to call us  back.

## 2023-12-21 ENCOUNTER — Encounter: Payer: Self-pay | Admitting: Student in an Organized Health Care Education/Training Program

## 2023-12-21 ENCOUNTER — Ambulatory Visit
Attending: Student in an Organized Health Care Education/Training Program | Admitting: Student in an Organized Health Care Education/Training Program

## 2023-12-21 DIAGNOSIS — M533 Sacrococcygeal disorders, not elsewhere classified: Secondary | ICD-10-CM

## 2023-12-22 NOTE — Progress Notes (Signed)
 No visit

## 2024-02-03 ENCOUNTER — Encounter: Payer: Self-pay | Admitting: Student in an Organized Health Care Education/Training Program

## 2024-02-03 ENCOUNTER — Other Ambulatory Visit: Payer: Self-pay | Admitting: Internal Medicine

## 2024-02-16 ENCOUNTER — Ambulatory Visit
Attending: Student in an Organized Health Care Education/Training Program | Admitting: Student in an Organized Health Care Education/Training Program

## 2024-02-16 ENCOUNTER — Telehealth: Payer: Self-pay

## 2024-02-16 DIAGNOSIS — G894 Chronic pain syndrome: Secondary | ICD-10-CM

## 2024-02-16 DIAGNOSIS — G5702 Lesion of sciatic nerve, left lower limb: Secondary | ICD-10-CM | POA: Diagnosis not present

## 2024-02-16 DIAGNOSIS — M461 Sacroiliitis, not elsewhere classified: Secondary | ICD-10-CM | POA: Diagnosis not present

## 2024-02-16 DIAGNOSIS — M533 Sacrococcygeal disorders, not elsewhere classified: Secondary | ICD-10-CM | POA: Diagnosis not present

## 2024-02-16 MED ORDER — CELECOXIB 100 MG PO CAPS: 100.0000 mg | ORAL_CAPSULE | Freq: Two times a day (BID) | ORAL | 1 refills | Status: AC

## 2024-02-16 NOTE — Progress Notes (Signed)
 PROVIDER NOTE: Interpretation of information contained herein should be left to medically-trained personnel. Specific patient instructions are provided elsewhere under Patient Instructions section of medical record. This document was created in part using AI and STT-dictation technology, any transcriptional errors that may result from this process are unintentional.  Patient: Audrey [name]  Service: E/M   PCP: Rollene Almarie LABOR, MD  DOB: 72-Mar-1952  DOS: 02/16/2024  Provider: Wallie Sherry, MD  MRN: [id number]  Delivery: Virtual Visit  Specialty: Interventional Pain Management  Type: Established Patient  Setting: Ambulatory outpatient facility  Specialty designation: 09  Referring Prov.: Rollene Almarie LABOR, *  Location: Remote location       Virtual Encounter - Pain Management PROVIDER NOTE: Information contained herein reflects review and annotations entered in association with encounter. Interpretation of such information and data should be left to medically-trained personnel. Information provided to patient can be located elsewhere in the medical record under Patient Instructions. Document created using STT-dictation technology, any transcriptional errors that may result from process are unintentional.    Contact & Pharmacy Preferred: [phone] Home: [phone] (home) Mobile: [phone] (mobile) E-mail: [email or web address]  [facility] - [place], [place] - [address] [place] [address] Phone: [phone] Fax: [phone]  [id number] [place], [place] - [address] AT [facility] OF [address] [place] [address] Phone: [phone] Fax: [phone]  [id number] - [address] [place] - [address] [place] [address] Phone: [phone] Fax: [phone]   Pre-screening  Ms. [name] offered in-person vs virtual encounter. She indicated  preferring virtual for this encounter.   Reason COVID-19*  Social distancing based on CDC and AMA recommendations.   I contacted Audrey [name] on 02/16/2024 via telephone.      I clearly identified myself as Wallie Sherry, MD. I verified that I was speaking with the correct person using two identifiers (Name: JADEN BATCHELDER, and date of birth: 10-Nov-1950).  Consent I sought verbal advanced consent from Audrey [name] for virtual visit interactions. I informed Ms. Lovick of possible security and privacy concerns, risks, and limitations associated with providing not-in-person medical evaluation and management services. I also informed Ms. [name] of the availability of in-person appointments. Finally, I informed her that there would be a charge for the virtual visit and that she could be  personally, fully or partially, financially responsible for it. Ms. Juenger expressed understanding and agreed to proceed.   Historic Elements   Ms. NELLE SAYED is a 73 y.o. year old, female patient evaluated today after our last contact on 12/21/2023. Ms. Viger  has a past medical history of Anxiety, Arthritis, Chicken pox, Depression, Dizziness, Elevated liver enzymes, Fatty liver, Hyperlipidemia, Hypertension, Shingles, and Wears glasses. She also  has a past surgical history that includes Tonsillectomy (1958); Pilonidal cyst excision (1972); Cesarean section (1987, 1989); Dilation and curettage of uterus (1986); Total hip arthroplasty (Right, 10/16/2015); Total hip arthroplasty (Left, 08/19/2016); and Hallux fusion (Right, 05/05/2022). Ms. Michna has a current medication list which includes the following prescription(s): amlodipine -valsartan , atorvastatin , celecoxib , desvenlafaxine , ibuprofen , metoprolol  succinate, and mirtazapine . She  reports that she has been smoking cigarettes. She started smoking about 58 years ago. She has a 54.2 pack-year smoking history. She has never used smokeless tobacco.  She reports current alcohol use of about 9.0 standard drinks of alcohol per week. She reports current drug use. Drug:  Marijuana. Ms. Wenk is allergic to zoloft [sertraline hcl], lisinopril, and wellbutrin [bupropion].  BMI: Estimated body mass index is 28.17 kg/m as calculated from the following:   Height as of 11/09/23: 5' 3 (1.6 m).   Weight as of 11/09/23: 159 lb (72.1 kg). Last encounter: 12/21/2023. Last procedure: 11/09/2023.  HPI  Today, she is being contacted for   Patient is status post left SI joint and left piriformis injection done 11/09/2023.  She states that she is doing very well in regards to her left sided SI joint and hip pain pain but now is endorsing right sided buttock overlying her SI joint and piriformis. She is also having right sided hip pain. She has been more active.  Laboratory Chemistry Profile   Renal Lab Results  Component Value Date   BUN 15 05/01/2023   CREATININE 0.71 05/01/2023   BCR NOT APPLICABLE 03/19/2018   GFR 85.46 05/01/2023   GFRAA >60 08/20/2016   GFRNONAA >60 08/20/2016    Hepatic Lab Results  Component Value Date   AST 16 05/01/2023   ALT 15 05/01/2023   ALBUMIN 4.7 05/01/2023   ALKPHOS 85 05/01/2023    Electrolytes Lab Results  Component Value Date   NA 139 05/01/2023   K 4.4 05/01/2023   CL 105 05/01/2023   CALCIUM  9.7 05/01/2023    Bone Lab Results  Component Value Date   VD25OH 49 03/19/2018    Inflammation (CRP: Acute Phase) (ESR: Chronic Phase) No results found for: CRP, ESRSEDRATE, LATICACIDVEN       Note: Above Lab results reviewed.  Imaging  DG PAIN CLINIC C-ARM 1-60 MIN NO REPORT Fluoro was used, but no Radiologist interpretation will be provided.  Please refer to NOTES tab for provider progress note.  Assessment  The primary encounter diagnosis was Sacroiliac joint pain. Diagnoses of SI joint arthritis (HCC), Piriformis syndrome, left, and Chronic pain syndrome were also pertinent to this  visit.  Plan of Care  [place] in [place]  so we are limited in interventional options.  I recommend that she discontinue ibuprofen  and do a trial of Celebrex  as below.  Follow-up face-to-face in November when she is back in town  Pharmacotherapy (Medications Ordered): Meds ordered this encounter  Medications   celecoxib  (CELEBREX ) 100 MG capsule    Sig: Take 1 capsule (100 mg total) by mouth 2 (two) times daily.    Dispense:  60 capsule    Refill:  1   Orders:  No orders of the defined types were placed in this encounter.  Follow-up plan:   Return for patient will call to schedule F2F appt prn.      Right L3/4 ESI 07/29/23; Right SIJ and piriformis 09/28/23; Left SIJ and piriformis 11/09/23      Recent Visits Date Type Provider Dept  12/21/23 Office Visit Marcelino Nurse, MD [facility]  Showing recent visits within past 90 days and meeting all other requirements Today's Visits Date Type Provider Dept  02/16/24 Office Visit Marcelino Nurse, MD [facility]  Showing today's visits and meeting all other requirements Future Appointments No visits were found meeting these conditions. Showing future appointments within next 90 days and meeting all other requirements  I discussed the assessment and treatment plan with the patient. The patient was provided an opportunity to ask questions and all were answered. The patient agreed with the plan and demonstrated an understanding of the instructions.  Patient advised to call back or seek an in-person  evaluation if the symptoms or condition worsens.  Duration of encounter: .  Note by: Wallie Sherry, MD Date: 02/16/2024; Time: 3:14 PM

## 2024-02-16 NOTE — Telephone Encounter (Signed)
 Left for patient to call back to go over questions for VV this afternoon.

## 2024-02-16 NOTE — Progress Notes (Signed)
 Spoke to patient to complete pre-charting for VV

## 2024-03-23 ENCOUNTER — Other Ambulatory Visit: Payer: Self-pay | Admitting: Internal Medicine

## 2024-03-23 ENCOUNTER — Encounter: Payer: Self-pay | Admitting: Student in an Organized Health Care Education/Training Program

## 2024-03-23 DIAGNOSIS — E7800 Pure hypercholesterolemia, unspecified: Secondary | ICD-10-CM

## 2024-04-04 NOTE — Telephone Encounter (Signed)
Please schedule her a follow up with me.

## 2024-04-15 ENCOUNTER — Other Ambulatory Visit: Payer: Self-pay | Admitting: Internal Medicine

## 2024-04-15 DIAGNOSIS — F32A Depression, unspecified: Secondary | ICD-10-CM

## 2024-04-28 ENCOUNTER — Ambulatory Visit

## 2024-04-28 VITALS — BP 150/82 | HR 73 | Ht 62.25 in | Wt 164.2 lb

## 2024-04-28 DIAGNOSIS — Z0000 Encounter for general adult medical examination without abnormal findings: Secondary | ICD-10-CM

## 2024-04-28 NOTE — Progress Notes (Signed)
 Subjective:   Audrey [name] is a 73 y.o. female who presents for a Medicare Annual Wellness Visit.  Allergies (verified) Zoloft [sertraline hcl], Lisinopril, and Wellbutrin [bupropion]   History: Past Medical History:  Diagnosis Date   Anxiety    Arthritis    Chicken pox    Depression    Dizziness    when bending over    Elevated liver enzymes    Fatty liver    Hyperlipidemia    Hypertension    Shingles    Wears glasses    Past Surgical History:  Procedure Laterality Date   CESAREAN SECTION  1987, 50   DILATION AND CURETTAGE OF UTERUS  1986   HALLUX FUSION Right 05/05/2022   Procedure: JULIOUS LIGHT, FIRST METATASALPHALANGEAL JOINT;  Surgeon: Tobie Franky SQUIBB, DPM;  Location: [facility];  Service: Podiatry;  Laterality: Right;  CHOICE WITH BLOCK   PILONIDAL CYST EXCISION  1972   TONSILLECTOMY  1958   TOTAL HIP ARTHROPLASTY Right 10/16/2015   Procedure: RIGHT TOTAL HIP ARTHROPLASTY ANTERIOR APPROACH;  Surgeon: Donnice Car, MD;  Location: [facility];  Service: Orthopedics;  Laterality: Right;   TOTAL HIP ARTHROPLASTY Left 08/19/2016   Procedure: LEFT TOTAL HIP ARTHROPLASTY ANTERIOR APPROACH;  Surgeon: Donnice Car, MD;  Location: [facility];  Service: Orthopedics;  Laterality: Left;  requests 70 mins   Family History  Problem Relation Age of Onset   Multiple sclerosis Mother    Mental illness Mother    Arthritis Father    Mental illness Maternal Aunt    Colon cancer Maternal Aunt 48   Heart disease Maternal Grandmother    Arthritis Paternal Grandmother    Hyperlipidemia Paternal Grandmother    Hypertension Paternal Grandmother    Heart disease Paternal Grandfather    Hypertension Paternal Grandfather    Mental illness Paternal Grandfather    Stomach cancer Neg Hx    Rectal cancer Neg Hx    Esophageal cancer Neg Hx    Social History   Occupational History   Occupation: RETIRED  Tobacco Use   Smoking status: Every Day    Current packs/day: 0.50     Average packs/day: 0.9 packs/day for 58.6 years (54.3 ttl pk-yrs)    Types: Cigarettes    Start date: 09/22/1965    Last attempt to quit: 09/23/2015   Smokeless tobacco: Never  Vaping Use   Vaping status: Never Used  Substance and Sexual Activity   Alcohol use: Yes    Alcohol/week: 9.0 standard drinks of alcohol    Types: 3 Glasses of wine, 6 Cans of beer per week   Drug use: Yes    Types: Marijuana    Comment: gummies   Sexual activity: Yes   Tobacco Counseling Ready to quit: Not Answered Counseling given: Not Answered  SDOH Screenings   Food Insecurity: No Food Insecurity (04/28/2024)  Housing: Unknown (04/28/2024)  Transportation Needs: No Transportation Needs (04/28/2024)  Utilities: Not At Risk (04/28/2024)  Alcohol Screen: Medium Risk (04/30/2023)  Depression (PHQ2-9): Medium Risk (04/28/2024)  Financial Resource Strain: Low Risk  (04/30/2023)  Physical Activity: Sufficiently Active (04/28/2024)  Social Connections: Moderately Isolated (04/28/2024)  Stress: Stress Concern Present (04/28/2024)  Tobacco Use: High Risk (04/28/2024)  Health Literacy: Adequate Health Literacy (04/28/2024)   Depression Screen    04/28/2024   11:35 AM 11/09/2023    1:49 PM 11/03/2023    9:56 AM 05/27/2023   11:07 AM 10/31/2021   11:11 AM 08/29/2020    3:56 PM  06/09/2019    9:39 AM  PHQ 2/9 Scores  PHQ - 2 Score 5 0 1 0 0 0 1  PHQ- 9 Score 9     0  2      Data saved with a previous flowsheet row definition     Goals Addressed   None    Visit info / Clinical Intake: Medicare Wellness Visit Type:: Subsequent Annual Wellness Visit Medicare Wellness Visit Mode:: In-person (required for WTM) Interpreter Needed?: No Pre-visit prep was completed: yes AWV questionnaire completed by patient prior to visit?: yes Date:: 04/24/24 Living arrangements:: lives with spouse/significant other Patient's Overall Health Status Rating: (!) poor Typical amount of pain: (!) a lot Does pain affect daily life?: (!)  yes (walking/standing) Are you currently prescribed opioids?: no  Dietary Habits and Nutritional Risks How many meals a day?: 2 Eats fruit and vegetables daily?: yes Most meals are obtained by: preparing own meals Diabetic:: no  Functional Status Activities of Daily Living (to include ambulation/medication): (!) (Patient-Rptd) Needs Assist Ambulation: Independent with device- listed below Home Assistive Devices/Equipment: Eyeglasses Medication Administration: Independent Home Management: (Patient-Rptd) Independent Manage your own finances?: yes Primary transportation is: driving Concerns about vision?: no *vision screening is required for WTM* Concerns about hearing?: no  Fall Screening Falls in the past year?: (Patient-Rptd) 1 Number of falls in past year: (Patient-Rptd) 1 Was there an injury with Fall?: (Patient-Rptd) 0 Fall Risk Category Calculator: (Patient-Rptd) 2 Patient Fall Risk Level: (Patient-Rptd) Moderate Fall Risk  Fall Risk Patient at Risk for Falls Due to: Impaired balance/gait Fall risk Follow up: Falls evaluation completed; Falls prevention discussed  Home and Transportation Safety: All rugs have non-skid backing?: N/A, no rugs All stairs or steps have railings?: N/A, no stairs Grab bars in the bathtub or shower?: (!) no Have non-skid surface in bathtub or shower?: yes Good home lighting?: yes Regular seat belt use?: yes Hospital stays in the last year:: no  Cognitive Assessment Difficulty concentrating, remembering, or making decisions? : no Will 6CIT or Mini Cog be Completed: no 6CIT or Mini Cog Declined: patient alert, oriented, able to answer questions appropriately and recall recent events  Advance Directives (For Healthcare) Does Patient Have a Medical Advance Directive?: Yes Type of Advance Directive: Healthcare Power of Attorney  Reviewed/Updated  Reviewed/Updated: All        Objective:    Today's Vitals   04/28/24 1127  BP: (!)  150/82  Pulse: 73  SpO2: 97%  Weight: 164 lb 3.2 oz (74.5 kg)  Height: 5' 2.25 (1.581 m)   Body mass index is 29.79 kg/m.  Current Medications (verified) Outpatient Encounter Medications as of 04/28/2024  Medication Sig   amLODipine -valsartan  (EXFORGE ) 5-320 MG tablet Take 1 tablet by mouth daily.   atorvastatin  (LIPITOR) 20 MG tablet TAKE 1 TABLET BY MOUTH AT BEDTIME   desvenlafaxine  (PRISTIQ ) 100 MG 24 hr tablet TAKE 1 TABLET BY MOUTH DAILY   ibuprofen  (ADVIL ) 200 MG tablet Take 600 mg by mouth every 6 (six) hours as needed for mild pain (pain score 1-3) or moderate pain (pain score 4-6). Reports that she takes 3 - 4 tablets daily as needed for pain.   metoprolol  succinate (TOPROL -XL) 100 MG 24 hr tablet TAKE 1 TABLET BY MOUTH DAILY   mirtazapine  (REMERON ) 15 MG tablet TAKE 1 TABLET BY MOUTH AT BEDTIME.   No facility-administered encounter medications on file as of 04/28/2024.   Hearing/Vision screen Hearing Screening - Comments:: Denies hearing difficulties   Vision Screening -  Comments:: Wears eyeglasses/per pt UTD/ Dr. Carolee Immunizations and Health Maintenance Health Maintenance  Topic Date Due   Lung Cancer Screening  Never done   Zoster Vaccines- Shingrix (1 of 2) 10/08/2001   DTaP/Tdap/Td (2 - Td or Tdap) 07/24/2020   Influenza Vaccine  01/22/2024   COVID-19 Vaccine (6 - 2025-26 season) 02/22/2024   Mammogram  07/07/2024   Colonoscopy  03/14/2025   Medicare Annual Wellness (AWV)  04/28/2025   Pneumococcal Vaccine: 50+ Years  Completed   DEXA SCAN  Completed   Hepatitis C Screening  Completed   Meningococcal B Vaccine  Aged Out        Assessment/Plan:  This is a routine wellness examination for Audrey [name].  Patient Care Team: Rollene Almarie LABOR, MD as PCP - General (Internal Medicine) Carolee Manus DASEN., MD as Consulting Physician (Ophthalmology)  I have personally reviewed and noted the following in the patient's chart:   Medical and social history Use of  alcohol, tobacco or illicit drugs  Current medications and supplements including opioid prescriptions. Functional ability and status Nutritional status Physical activity Advanced directives List of other physicians Hospitalizations, surgeries, and ER visits in previous 12 months Vitals Screenings to include cognitive, depression, and falls Referrals and appointments  No orders of the defined types were placed in this encounter.  In addition, I have reviewed and discussed with patient certain preventive protocols, quality metrics, and best practice recommendations. A written personalized care plan for preventive services as well as general preventive health recommendations were provided to patient.   Audrey [name] L Tyria Springer, CMA   04/28/2024   Return in 1 year (on 04/28/2025).  After Visit Summary: (MyChart) Due to this being a telephonic visit, the after visit summary with patients personalized plan was offered to patient via MyChart   Nurse Notes: Patient is very upset today as she thought that this appointment was to see Dr. Rollene.  Patient is scheduled for a CPE with Dr. Rollene in 2 weeks.

## 2024-04-28 NOTE — Patient Instructions (Addendum)
 Ms. Kemler,  Thank you for taking the time for your Medicare Wellness Visit. I appreciate your continued commitment to your health goals. Please review the care plan we discussed, and feel free to reach out if I can assist you further.  Please note that Annual Wellness Visits do not include a physical exam. Some assessments may be limited, especially if the visit was conducted virtually. If needed, we may recommend an in-person follow-up with your provider.  Ongoing Care Seeing your primary care provider every 3 to 6 months helps us  monitor your health and provide consistent, personalized care. Last office visit on 05/01/2023.  You will be due for a mammogram by January 2026.  Please call [facility] to schedule.  You will also be due for a colonoscopy next year.    Referrals If a referral was made during today's visit and you haven't received any updates within two weeks, please contact the referred provider directly to check on the status.  Recommended Screenings:  Health Maintenance  Topic Date Due   Screening for Lung Cancer  Never done   Zoster (Shingles) Vaccine (1 of 2) 10/08/2001   DTaP/Tdap/Td vaccine (2 - Td or Tdap) 07/24/2020   Flu Shot  01/22/2024   COVID-19 Vaccine (6 - 2025-26 season) 02/22/2024   Breast Cancer Screening  07/07/2024   Colon Cancer Screening  03/14/2025   Medicare Annual Wellness Visit  04/28/2025   Pneumococcal Vaccine for age over 58  Completed   DEXA scan (bone density measurement)  Completed   Hepatitis C Screening  Completed   Meningitis B Vaccine  Aged Out       11/09/2023    1:49 PM  Advanced Directives  Does Patient Have a Medical Advance Directive? Yes  Type of Estate Agent of [place];Living will    Vision: Annual vision screenings are recommended for early detection of glaucoma, cataracts, and diabetic retinopathy. These exams can also reveal signs of chronic conditions such as diabetes and  high blood pressure.  Dental: Annual dental screenings help detect early signs of oral cancer, gum disease, and other conditions linked to overall health, including heart disease and diabetes.  Please see the attached documents for additional preventive care recommendations.

## 2024-05-10 ENCOUNTER — Ambulatory Visit
Attending: Student in an Organized Health Care Education/Training Program | Admitting: Student in an Organized Health Care Education/Training Program

## 2024-05-10 ENCOUNTER — Encounter: Payer: Self-pay | Admitting: Student in an Organized Health Care Education/Training Program

## 2024-05-10 VITALS — BP 172/83 | HR 73 | Temp 98.2°F | Resp 16 | Ht 62.25 in | Wt 164.0 lb

## 2024-05-10 DIAGNOSIS — M7061 Trochanteric bursitis, right hip: Secondary | ICD-10-CM | POA: Diagnosis not present

## 2024-05-10 DIAGNOSIS — Z96641 Presence of right artificial hip joint: Secondary | ICD-10-CM | POA: Diagnosis not present

## 2024-05-10 DIAGNOSIS — M25551 Pain in right hip: Secondary | ICD-10-CM | POA: Insufficient documentation

## 2024-05-10 MED ORDER — DICLOFENAC SODIUM 75 MG PO TBEC: 75.0000 mg | DELAYED_RELEASE_TABLET | Freq: Two times a day (BID) | ORAL | 0 refills | Status: DC

## 2024-05-10 NOTE — Progress Notes (Signed)
 Safety precautions to be maintained throughout the outpatient stay will include: orient to surroundings, keep bed in low position, maintain call bell within reach at all times, provide assistance with transfer out of bed and ambulation.

## 2024-05-10 NOTE — Progress Notes (Signed)
 PROVIDER NOTE: Interpretation of information contained herein should be left to medically-trained personnel. Specific patient instructions are provided elsewhere under Patient Instructions section of medical record. This document was created in part using AI and STT-dictation technology, any transcriptional errors that may result from this process are unintentional.  Patient: Audrey [name]  Service: E/M   PCP: Rollene Almarie LABOR, MD  DOB: 1951/05/02  DOS: 05/10/2024  Provider: Wallie Sherry, MD  MRN: [id number]  Delivery: Face-to-face  Specialty: Interventional Pain Management  Type: Established Patient  Setting: Ambulatory outpatient facility  Specialty designation: 09  Referring Prov.: Rollene Almarie LABOR, *  Location: Outpatient office facility       History of present illness (HPI) Audrey [name], a 73 y.o. year old female, is here today because of her Trochanteric bursitis of right hip [M70.61]. Audrey [name] primary complain today is Hip Pain (Right )   Pain Assessment: Severity of Chronic pain is reported as a 8 /10. Location: Hip Right/unable to raise hands very high d/t it causes pain in her hip. Onset: More than a month ago. Quality: Discomfort, Stabbing. Timing: Intermittent. Modifying factor(s): cold pack helps or rest, position changes. Vitals:  height is 5' 2.25 (1.581 m) and weight is 164 lb (74.4 kg). Her temporal temperature is 98.2 F (36.8 C). Her blood pressure is 172/83 (abnormal) and her pulse is 73. Her respiration is 16 and oxygen saturation is 97%.  BMI: Estimated body mass index is 29.76 kg/m as calculated from the following:   Height as of this encounter: 5' 2.25 (1.581 m).   Weight as of this encounter: 164 lb (74.4 kg).  Last encounter: 02/16/2024. Last procedure: 11/09/2023.  Reason for encounter:   Discussed the use of AI scribe software for clinical note transcription with the patient, who gave verbal consent to proceed.  History of Present  Illness   Audrey [name] is a 73 year old female with bilateral hip replacements who presents with right hip pain.  She experiences stabbing pain in her right hip, particularly when standing for extended periods or bending. The pain is located on the lateral aspect of the hip and feels as though it is 'in the bone.' It worsens throughout the day, starting in the morning and becoming excruciating by nighttime. She is unable to reach up to hang clothes due to the pain.  She has a history of bilateral hip replacements and mentions that the current pain is similar to what she experienced before her hip surgeries, which was initially thought to be related to the bursa. She has not had a bursa injection before.  Her current pain management includes taking four Advil  gel tabs, which helps manage the pain to some extent. She has previously tried Celebrex  and venlafaxine  without relief. Muscle relaxers have also been ineffective in the past.  She maintains a routine of walking every other day or every three days and has been performing back strengthening exercises every other day for about a month. Despite these activities, she continues to experience significant pain.  No kidney disease.       ROS  Constitutional: Denies any fever or chills Gastrointestinal: No reported hemesis, hematochezia, vomiting, or acute GI distress Musculoskeletal: right hip pain Neurological: No reported episodes of acute onset apraxia, aphasia, dysarthria, agnosia, amnesia, paralysis, loss of coordination, or loss of consciousness  Medication Review  amLODipine -valsartan , atorvastatin , desvenlafaxine , diclofenac, metoprolol  succinate, and mirtazapine   History Review  Allergy: Audrey [name] is allergic to zoloft [sertraline hcl], lisinopril,  and wellbutrin [bupropion]. Drug: Audrey [name]  reports current drug use. Drug: Marijuana. Alcohol:  reports current alcohol use of about 9.0 standard drinks of alcohol per  week. Tobacco:  reports that she has been smoking cigarettes. She started smoking about 58 years ago. She has a 54.3 pack-year smoking history. She has never used smokeless tobacco. Social: Audrey [name]  reports that she has been smoking cigarettes. She started smoking about 58 years ago. She has a 54.3 pack-year smoking history. She has never used smokeless tobacco. She reports current alcohol use of about 9.0 standard drinks of alcohol per week. She reports current drug use. Drug: Marijuana. Medical:  has a past medical history of Anxiety, Arthritis, Chicken pox, Depression, Dizziness, Elevated liver enzymes, Fatty liver, Hyperlipidemia, Hypertension, Shingles, and Wears glasses. Surgical: Audrey [name]  has a past surgical history that includes Tonsillectomy (1958); Pilonidal cyst excision (1972); Cesarean section (1987, 1989); Dilation and curettage of uterus (1986); Total hip arthroplasty (Right, 10/16/2015); Total hip arthroplasty (Left, 08/19/2016); and Hallux fusion (Right, 05/05/2022). Family: family history includes Arthritis in her father and paternal grandmother; Colon cancer (age of onset: 36) in her maternal aunt; Heart disease in her maternal grandmother and paternal grandfather; Hyperlipidemia in her paternal grandmother; Hypertension in her paternal grandfather and paternal grandmother; Mental illness in her maternal aunt, mother, and paternal grandfather; Multiple sclerosis in her mother.  Laboratory Chemistry Profile   Renal Lab Results  Component Value Date   BUN 15 05/01/2023   CREATININE 0.71 05/01/2023   BCR NOT APPLICABLE 03/19/2018   GFR 85.46 05/01/2023   GFRAA >60 08/20/2016   GFRNONAA >60 08/20/2016    Hepatic Lab Results  Component Value Date   AST 16 05/01/2023   ALT 15 05/01/2023   ALBUMIN 4.7 05/01/2023   ALKPHOS 85 05/01/2023    Electrolytes Lab Results  Component Value Date   NA 139 05/01/2023   K 4.4 05/01/2023   CL 105 05/01/2023   CALCIUM  9.7  05/01/2023    Bone Lab Results  Component Value Date   VD25OH 49 03/19/2018    Inflammation (CRP: Acute Phase) (ESR: Chronic Phase) No results found for: CRP, ESRSEDRATE, LATICACIDVEN       Note: Above Lab results reviewed.  Recent Imaging Review  DG PAIN CLINIC C-ARM 1-60 MIN NO REPORT Fluoro was used, but no Radiologist interpretation will be provided.  Please refer to NOTES tab for provider progress note. Note: Reviewed        Physical Exam  Vitals: BP (!) 172/83 (BP Location: Left Arm, Patient Position: Sitting, Cuff Size: Normal)   Pulse 73   Temp 98.2 F (36.8 C) (Temporal)   Resp 16   Ht 5' 2.25 (1.581 m)   Wt 164 lb (74.4 kg)   SpO2 97%   BMI 29.76 kg/m  BMI: Estimated body mass index is 29.76 kg/m as calculated from the following:   Height as of this encounter: 5' 2.25 (1.581 m).   Weight as of this encounter: 164 lb (74.4 kg). Ideal: Ideal body weight: 50.7 kg (111 lb 11.5 oz) Adjusted ideal body weight: 60.2 kg (132 lb 10.1 oz) General appearance: Well nourished, well developed, and well hydrated. In no apparent acute distress Mental status: Alert, oriented x 3 (person, place, & time)       Respiratory: No evidence of acute respiratory distress Eyes: PERLA   Assessment   Diagnosis  1. Trochanteric bursitis of right hip   2. History of right hip replacement   3.  Right hip pain      Updated Problems: Problem  Trochanteric Bursitis of Right Hip  Right Hip Pain  History of Right Hip Replacement    Plan of Care  Assessment and Plan    Right hip trochanteric bursitis with right hip pain   Chronic right hip pain is likely due to trochanteric bursitis, worsened by prolonged standing, bending, and weight-bearing activities. The pain is stabbing and intensifies throughout the day, becoming excruciating by evening. Previous treatments with Celebrex  and venlafaxine  were ineffective. The differential diagnosis includes bursitis based on the pain's  location and nature. She has no history of bursa injection and no kidney disease, allowing for NSAID use. Prescribe diclofenac 75 mg twice daily after meals for 30 days. Advise against concurrent use of ibuprofen  while taking diclofenac. Schedule a follow-up appointment on December 30th to assess response to treatment.      Consider right greater trochanteric bursa injection in the future  [name] has a current medication list which includes the following long-term medication(s): amlodipine -valsartan , atorvastatin , desvenlafaxine , metoprolol  succinate, and mirtazapine .  Pharmacotherapy (Medications Ordered): Meds ordered this encounter  Medications   diclofenac (VOLTAREN) 75 MG EC tablet    Sig: Take 1 tablet (75 mg total) by mouth 2 (two) times daily.    Dispense:  60 tablet    Refill:  0   Orders:  No orders of the defined types were placed in this encounter.    Right L3/4 ESI 07/29/23; Right SIJ and piriformis 09/28/23; Left SIJ and piriformis 11/09/23     Return in about 6 weeks (around 06/21/2024) for MM, F2F (consider right hip busra injection).    Recent Visits Date Type Provider Dept  02/16/24 Office Visit Marcelino Nurse, MD [facility]  Showing recent visits within past 90 days and meeting all other requirements Today's Visits Date Type Provider Dept  05/10/24 Office Visit Marcelino Nurse, MD [facility]  Showing today's visits and meeting all other requirements Future Appointments No visits were found meeting these conditions. Showing future appointments within next 90 days and meeting all other requirements  I discussed the assessment and treatment plan with the patient. The patient was provided an opportunity to ask questions and all were answered. The patient agreed with the plan and demonstrated an understanding of the instructions.  Patient advised to call back or seek an in-person evaluation if the symptoms or condition worsens.  I  personally spent a total of 20 minutes in the care of the patient today including preparing to see the patient, getting/reviewing separately obtained history, performing a medically appropriate exam/evaluation, counseling and educating, placing orders, and documenting clinical information in the EHR.   Note by: Nurse Marcelino, MD (TTS and AI technology used. I apologize for any typographical errors that were not detected and corrected.) Date: 05/10/2024; Time: 3:01 PM

## 2024-05-11 ENCOUNTER — Ambulatory Visit (INDEPENDENT_AMBULATORY_CARE_PROVIDER_SITE_OTHER): Admitting: Internal Medicine

## 2024-05-11 ENCOUNTER — Encounter: Payer: Self-pay | Admitting: Internal Medicine

## 2024-05-11 VITALS — BP 140/80 | HR 61 | Temp 97.9°F | Ht 62.25 in | Wt 163.0 lb

## 2024-05-11 DIAGNOSIS — F419 Anxiety disorder, unspecified: Secondary | ICD-10-CM

## 2024-05-11 DIAGNOSIS — E7800 Pure hypercholesterolemia, unspecified: Secondary | ICD-10-CM

## 2024-05-11 DIAGNOSIS — Z0000 Encounter for general adult medical examination without abnormal findings: Secondary | ICD-10-CM | POA: Diagnosis not present

## 2024-05-11 DIAGNOSIS — F32A Depression, unspecified: Secondary | ICD-10-CM

## 2024-05-11 DIAGNOSIS — I10 Essential (primary) hypertension: Secondary | ICD-10-CM

## 2024-05-11 DIAGNOSIS — M7061 Trochanteric bursitis, right hip: Secondary | ICD-10-CM | POA: Diagnosis not present

## 2024-05-11 LAB — COMPREHENSIVE METABOLIC PANEL WITH GFR
ALT: 18 U/L (ref 0–35)
AST: 19 U/L (ref 0–37)
Albumin: 4.7 g/dL (ref 3.5–5.2)
Alkaline Phosphatase: 70 U/L (ref 39–117)
BUN: 12 mg/dL (ref 6–23)
CO2: 30 meq/L (ref 19–32)
Calcium: 9.5 mg/dL (ref 8.4–10.5)
Chloride: 102 meq/L (ref 96–112)
Creatinine, Ser: 0.67 mg/dL (ref 0.40–1.20)
GFR: 87.22 mL/min (ref >60.00)
Glucose, Bld: 111 mg/dL — ABNORMAL HIGH (ref 70–99)
Potassium: 4.6 meq/L (ref 3.5–5.1)
Sodium: 138 meq/L (ref 135–145)
Total Bilirubin: 0.6 mg/dL (ref 0.2–1.2)
Total Protein: 7.1 g/dL (ref 6.0–8.3)

## 2024-05-11 LAB — LIPID PANEL
Cholesterol: 166 mg/dL (ref 0–200)
HDL: 63.3 mg/dL (ref >39.00)
LDL Cholesterol: 82 mg/dL (ref 0–99)
NonHDL: 102.92
Total CHOL/HDL Ratio: 3
Triglycerides: 107 mg/dL (ref 0.0–149.0)
VLDL: 21.4 mg/dL (ref 0.0–40.0)

## 2024-05-11 LAB — CBC
HCT: 42.7 % (ref 36.0–46.0)
Hemoglobin: 14.4 g/dL (ref 12.0–15.0)
MCHC: 33.8 g/dL (ref 30.0–36.0)
MCV: 93.1 fl (ref 78.0–100.0)
Platelets: 166 K/uL (ref 150.0–400.0)
RBC: 4.58 Mil/uL (ref 3.87–5.11)
RDW: 14.2 % (ref 11.5–15.5)
WBC: 8.6 K/uL (ref 4.0–10.5)

## 2024-05-11 NOTE — Progress Notes (Signed)
 Subjective:   Patient ID: Audrey [name], female    DOB: [birth year], 73 y.o.   MRN: [id number]  The patient is here for physical. Pertinent topics discussed: Discussed the use of AI scribe software for clinical note transcription with the patient, who gave verbal consent to proceed.  History of Present Illness Audrey [name] is a 73 year old female who presents with chronic back pain.  She has been experiencing severe back pain for the past three years, which has been treated with various injections and medications. Recently, a pain specialist suggested the possibility of bursitis and prescribed an anti-inflammatory medication to be taken for thirty days. The pain is debilitating, worsening from morning to night, and significantly impacts her mobility and daily activities.  Despite the pain, she continues to exercise every other day and walks four miles, albeit slowly, with frequent stops. 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  She has a history of smoking and has attempted to quit multiple times, influenced by her daughter's encouragement. She previously quit for fourteen years but resumed smoking after a single cigarette. She is concerned about weight gain associated with quitting smoking, noting a recent weight increase of four pounds.  No chest pain, tightness, heart racing, breathing problems, or persistent coughing. She reports a recent episode of gastrointestinal upset, likely food poisoning, which resolved within twelve hours.  Mood fluctuations are related to her pain levels, feeling particularly emotional when discussing her limitations with her daughter, who is concerned about her well-being.  No new headaches, vision changes, or hearing changes. She anticipates needing cataract surgery in the future and has an appointment scheduled for December 30th to address this.  She inquires about obtaining a handicap  parking pass due to difficulty walking long distances, especially when attending events or fairs.  PMH, [facility], social history reviewed and updated  Review of Systems  Constitutional: Negative.   HENT: Negative.    Eyes: Negative.   Respiratory:  Negative for cough, chest tightness and shortness of breath.   Cardiovascular:  Negative for chest pain, palpitations and leg swelling.  Gastrointestinal:  Negative for abdominal distention, abdominal pain, constipation, diarrhea, nausea and vomiting.  Musculoskeletal:  Positive for arthralgias and myalgias.  Skin: Negative.   Neurological: Negative.   Psychiatric/Behavioral: Negative.      Objective:  Physical Exam Constitutional:      Appearance: She is well-developed.  HENT:     Head: Normocephalic and atraumatic.  Cardiovascular:     Rate and Rhythm: Normal rate and regular rhythm.  Pulmonary:     Effort: Pulmonary effort is normal. No respiratory distress.     Breath sounds: Normal breath sounds. No wheezing or rales.  Abdominal:     General: Bowel sounds are normal. There is no distension.     Palpations: Abdomen is soft.     Tenderness: There is no abdominal tenderness.  Musculoskeletal:        General: Tenderness present.     Cervical back: Normal range of motion.  Skin:    General: Skin is warm and dry.  Neurological:     Mental Status: She is alert and oriented to person, place, and time.     Coordination: Coordination normal.     Vitals:   05/11/24 1010 05/11/24 1018  BP: (!) 160/90 (!) 140/80  Pulse: 61   Temp: 97.9 F (36.6 C)   TempSrc: Oral   SpO2:  99%   Weight: 163 lb (73.9 kg)   Height: 5' 2.25 (1.581 m)     Assessment & Plan:

## 2024-05-11 NOTE — Patient Instructions (Signed)
uu 

## 2024-05-13 ENCOUNTER — Ambulatory Visit: Payer: Self-pay | Admitting: Internal Medicine

## 2024-05-13 NOTE — Assessment & Plan Note (Signed)
 Taking remeron  and this is helping with mood and sleep. Continue.

## 2024-05-13 NOTE — Assessment & Plan Note (Signed)
 Advised to try voltaren  oral to see if this helps and follow up with her provider if not helping.

## 2024-05-13 NOTE — Assessment & Plan Note (Signed)
 Checking lipid panel and adjust as needed.

## 2024-05-13 NOTE — Assessment & Plan Note (Signed)
 BP at goal on meds will check CMP and adjust as needed.

## 2024-05-13 NOTE — Assessment & Plan Note (Signed)
 Flu shot up to date. Pneumonia complete. Shingrix due at pharmacy. Tetanus due at pharmacy. Colonoscopy up to date. Mammogram due soon, pap smear aged out and dexa up to date. Counseled about sun safety and mole surveillance. Counseled about the dangers of distracted driving. Given 10 year screening recommendations.

## 2024-05-17 DIAGNOSIS — K080 Exfoliation of teeth due to systemic causes: Secondary | ICD-10-CM | POA: Diagnosis not present

## 2024-05-18 ENCOUNTER — Emergency Department (HOSPITAL_COMMUNITY): Admission: EM | Admit: 2024-05-18 | Discharge: 2024-05-19 | Disposition: A | Discharge status: Home / Self Care

## 2024-05-18 ENCOUNTER — Other Ambulatory Visit: Payer: Self-pay

## 2024-05-18 ENCOUNTER — Encounter (HOSPITAL_COMMUNITY): Payer: Self-pay

## 2024-05-18 DIAGNOSIS — W01198A Fall on same level from slipping, tripping and stumbling with subsequent striking against other object, initial encounter: Secondary | ICD-10-CM | POA: Insufficient documentation

## 2024-05-18 DIAGNOSIS — W19XXXA Unspecified fall, initial encounter: Secondary | ICD-10-CM

## 2024-05-18 DIAGNOSIS — S0181XA Laceration without foreign body of other part of head, initial encounter: Secondary | ICD-10-CM | POA: Diagnosis not present

## 2024-05-18 DIAGNOSIS — S0990XA Unspecified injury of head, initial encounter: Secondary | ICD-10-CM

## 2024-05-18 MED ORDER — SODIUM CHLORIDE 0.9 % IV SOLN
1.0000 g | Freq: Once | INTRAVENOUS | Status: AC
  Administered 2024-05-19: 1 g via INTRAVENOUS
  Filled 2024-05-18: qty 10

## 2024-05-18 MED ORDER — LORAZEPAM 1 MG PO TABS
1.0000 mg | ORAL_TABLET | Freq: Once | ORAL | Status: AC
  Administered 2024-05-18: 1 mg via ORAL
  Filled 2024-05-18: qty 1

## 2024-05-18 MED ORDER — LIDOCAINE-EPINEPHRINE (PF) 2 %-1:200000 IJ SOLN
20.0000 mL | Freq: Once | INTRAMUSCULAR | Status: AC
  Administered 2024-05-19: 20 mL
  Filled 2024-05-18: qty 20

## 2024-05-18 NOTE — ED Triage Notes (Signed)
 Pt bib [facility] coming from home after patient had mechanical fall in room that happened about an hour ago. Pt did hit her head on unknown object and per EMS has laceration across forehead that in 6inches in length. Bleeding is controlled. Pt not on blood thinners. EMS reports patient had episode of diarrhea when stood up with EMS. GCS 15.   EMS VS: 201/72 80 HR 98% RA

## 2024-05-18 NOTE — ED Notes (Signed)
 EDP notified of patient laceration to forehead. EDP coming to bedside.

## 2024-05-18 NOTE — ED Provider Notes (Signed)
 [facility] Emergency Department Provider Note MRN:  [id number]  Arrival date & time: 05/19/24     Chief Complaint   Fall   History of Present Illness   Audrey [name] is a 73 y.o. year-old female presents to the ED with chief complaint of head injury.  She states that she had a few glasses of wine tonight and tripped while going to the bathroom.  She fell forward striking her head on the ledge of a shoe tray.  She did not lose consciousness.  She sustained a large laceration to the forehead.  She denies any injuries to her extremities.  Denies any chest pain, shortness of breath, or abdominal pain.  She reports chronic back pain, but states that this is not any different than baseline.  Denies any numbness, weakness, or tingling.  Denies vomiting or seizure.  She is not anticoagulated.SABRA  History provided by patient.   Review of Systems  Pertinent positive and negative review of systems noted in HPI.    Physical Exam   Vitals:   05/18/24 2330 05/18/24 2345  BP: (!) 141/68 137/61  Pulse: 61 63  Resp:    Temp:    SpO2: 100% 100%    CONSTITUTIONAL:  non toxic-appearing, NAD NEURO:  Alert and oriented x 3, CN 3-12 grossly intact EYES:  eyes equal and reactive ENT/NECK:  Supple, no stridor  CARDIO:  normal rate, regular rhythm, appears well-perfused  PULM:  No respiratory distress, CTAB GI/GU:  non-distended,  MSK/SPINE:  No gross deformities, no edema, moves all extremities  SKIN:  no rash, 8 cm laceration to forehead as pictured   *Additional and/or pertinent findings included in MDM below  Diagnostic and Interventional Summary    EKG Interpretation Date/Time:    Ventricular Rate:    PR Interval:    QRS Duration:    QT Interval:    QTC Calculation:   R Axis:      Text Interpretation:         Labs Reviewed - No data to display  CT Head Wo Contrast  Final Result    CT Cervical Spine Wo Contrast  Final Result      Medications   lidocaine -EPINEPHrine  (XYLOCAINE  W/EPI) 2 %-1:200000 (PF) injection 20 mL (20 mLs Infiltration Given by Other 05/19/24 0127)  LORazepam  (ATIVAN ) tablet 1 mg (1 mg Oral Given 05/18/24 2300)  cefTRIAXone  (ROCEPHIN ) 1 g in sodium chloride  0.9 % 100 mL IVPB (0 g Intravenous Stopped 05/19/24 0208)  morphine  (PF) 4 MG/ML injection 4 mg (4 mg Intravenous Given 05/19/24 0123)  ondansetron  (ZOFRAN ) injection 4 mg (4 mg Intravenous Given 05/19/24 0124)     Procedures  /  Critical Care .Laceration Repair  Date/Time: 05/19/2024 2:06 AM  Performed by: Vicky Charleston, PA-C Authorized by: Vicky Charleston, PA-C   Consent:    Consent obtained:  Verbal   Consent given by:  Patient   Risks, benefits, and alternatives were discussed: yes     Risks discussed:  Infection, pain, poor cosmetic result, poor wound healing, need for additional repair, nerve damage and vascular damage   Alternatives discussed:  No treatment Universal protocol:    Procedure explained and questions answered to patient or proxy's satisfaction: yes     Relevant documents present and verified: yes     Test results available: yes     Imaging studies available: yes     Required blood products, implants, devices, and special equipment available: yes     Site/side  marked: yes     Immediately prior to procedure, a time out was called: yes     Patient identity confirmed:  Verbally with patient Anesthesia:    Anesthesia method:  Local infiltration   Local anesthetic:  Lidocaine  1% WITH epi Laceration details:    Location:  Face (forehead)   Face location:  Forehead   Length (cm):  8 Pre-procedure details:    Preparation:  Patient was prepped and draped in usual sterile fashion and imaging obtained to evaluate for foreign bodies Exploration:    Wound exploration: wound explored through full range of motion and entire depth of wound visualized     Wound extent: no underlying fracture     Contaminated: no   Treatment:    Area  cleansed with:  Saline   Amount of cleaning:  Extensive   Irrigation solution:  Sterile saline Skin repair:    Repair method:  Sutures   Suture size:  5-0   Suture material:  Nylon   Number of sutures:  22 Approximation:    Approximation:  Close Repair type:    Repair type:  Simple Post-procedure details:    Dressing:  Antibiotic ointment, non-adherent dressing and bulky dressing   Procedure completion:  Tolerated well, no immediate complications   ED Course and Medical Decision Making  I have reviewed the triage vital signs, the nursing notes, and pertinent available records from the EMR.  Social Determinants Affecting Complexity of Care: Patient has no clinically significant social determinants affecting this chief complaint..   ED Course: Clinical Course as of 05/19/24 0210  Thu May 19, 2024  0046 I consulted with Dr. Roark, ENT, who tells me that I am ok to irrigate and close the skin.  States no need to approximate anything beneath that in this location. [RB]    Clinical Course User Index [RB] Vicky Charleston, PA-C    Medical Decision Making Patient here after tripping and falling and hitting her head.  She sustained a large laceration to her forehead.  She states that she had a few glasses of wine and tripped.  There was no LOC.  She denies any seizure, vomiting, numbness, weakness, or tingling.  CT imaging shows no evidence of skull fracture or intracranial bleed.  She does not have any other apparent injuries.  Laceration repaired at bedside.  I did consult with ENT, Dr. Roark, with whom I discussed the laceration and the fact that it goes down to the bone.  He states to irrigate well and close at the skin.  Patient was given a dose of antibiotic while in the ED.  I will send her home on Keflex  due to the the large length of the laceration.  It was copiously irrigated.  Return precautions have been discussed.  I encouraged her to follow-up with her PCP, urgent care,  or to return here in 5 days for wound check and to see if sutures are okay to come out.  Discussed that it may take another day or 2 beyond the 5 days due to the depth and the length of the laceration.  Patient understands and agrees the plan.  Amount and/or Complexity of Data Reviewed Radiology: ordered.  Risk Prescription drug management.         Consultants: No consultations were needed in caring for this patient.   Treatment and Plan: I considered admission due to patient's initial presentation, but after considering the examination and diagnostic results, patient will not require admission and can be  discharged with outpatient follow-up.    Final Clinical Impressions(s) / ED Diagnoses     ICD-10-CM   1. Fall, initial encounter  W19.XXXA     2. Injury of head, initial encounter  S09.90XA     3. Laceration of forehead, initial encounter  S01.81XA       ED Discharge Orders          Ordered    cephALEXin  (KEFLEX ) 500 MG capsule  2 times daily        05/19/24 0205              Discharge Instructions Discussed with and Provided to Patient:     Discharge Instructions      You need to have the wound rechecked in 5 days to see if it is ready to have the sutures removed.  It may take 7 days, but I recommend an evaluation at the 5-day mark.  Please take antibiotics as directed.  You can take Tylenol  for pain.  Please keep the wound clean and dry except for showering.  Please allow the wound adequate exposure to air.       Vicky Charleston, PA-C 05/19/24 0210    Lorette Mayo, MD 05/19/24 [phone]

## 2024-05-19 ENCOUNTER — Emergency Department (HOSPITAL_COMMUNITY)

## 2024-05-19 DIAGNOSIS — M47812 Spondylosis without myelopathy or radiculopathy, cervical region: Secondary | ICD-10-CM | POA: Diagnosis not present

## 2024-05-19 DIAGNOSIS — S0993XA Unspecified injury of face, initial encounter: Secondary | ICD-10-CM | POA: Diagnosis not present

## 2024-05-19 DIAGNOSIS — M50321 Other cervical disc degeneration at C4-C5 level: Secondary | ICD-10-CM | POA: Diagnosis not present

## 2024-05-19 DIAGNOSIS — S0181XA Laceration without foreign body of other part of head, initial encounter: Secondary | ICD-10-CM | POA: Diagnosis not present

## 2024-05-19 DIAGNOSIS — S199XXA Unspecified injury of neck, initial encounter: Secondary | ICD-10-CM | POA: Diagnosis not present

## 2024-05-19 MED ORDER — ONDANSETRON HCL 4 MG/2ML IJ SOLN
4.0000 mg | Freq: Once | INTRAMUSCULAR | Status: AC
Start: 2024-05-19 — End: 2024-05-19
  Administered 2024-05-19: 4 mg via INTRAVENOUS
  Filled 2024-05-19: qty 2

## 2024-05-19 MED ORDER — CEPHALEXIN 500 MG PO CAPS: 500.0000 mg | ORAL_CAPSULE | Freq: Two times a day (BID) | ORAL | 0 refills | Status: DC

## 2024-05-19 MED ORDER — MORPHINE SULFATE (PF) 4 MG/ML IV SOLN
4.0000 mg | Freq: Once | INTRAVENOUS | Status: AC
  Administered 2024-05-19: 4 mg via INTRAVENOUS
  Filled 2024-05-19: qty 1

## 2024-05-19 NOTE — Discharge Instructions (Addendum)
 You need to have the wound rechecked in 5 days to see if it is ready to have the sutures removed.  It may take 7 days, but I recommend an evaluation at the 5-day mark.  Please take antibiotics as directed.  You can take Tylenol  for pain.  Please keep the wound clean and dry except for showering.  Please allow the wound adequate exposure to air.

## 2024-05-19 NOTE — ED Notes (Signed)
 Ambulated to bathroom without any dizziness. Husband stayed by her side the entire time. Paper scrubs given

## 2024-05-19 NOTE — Progress Notes (Signed)
   05/18/24 2250  Spiritual Encounters  Type of Visit Initial  Care provided to: Pt and family  Reason for visit Trauma  OnCall Visit Yes   Responded to trauma, briefly provided support to spouse. Patient needed to go to the bathroom. Alerted nurse on behalf of patient. Advised patient and spouse of chaplain availability.

## 2024-05-24 ENCOUNTER — Ambulatory Visit: Admitting: Internal Medicine

## 2024-05-24 VITALS — BP 120/80 | HR 64 | Temp 97.8°F | Ht 62.25 in | Wt 163.4 lb

## 2024-05-24 DIAGNOSIS — R519 Headache, unspecified: Secondary | ICD-10-CM

## 2024-05-24 NOTE — Progress Notes (Unsigned)
   Subjective:   Patient ID: Audrey [name], female    DOB: 02-06-1951, 73 y.o.   MRN: [id number]  Discussed the use of AI scribe software for clinical note transcription with the patient, who gave verbal consent to proceed.  History of Present Illness Audrey [name] is a 73 year old female who presents with a head laceration requiring suture removal.  The head laceration occurred approximately five and a half days ago, extending to the bone and requiring 22 stitches. She was evaluated by an ENT specialist and treated in the emergency room, where she received morphine  for pain and lorazepam  for anxiety. A CT scan was performed, and the wound was numbed before stitching.  She was prescribed antibiotics to prevent infection due to the size and depth of the wound and has been compliant with the regimen. She is eager to have the stitches removed. She has been wearing a shower cap to protect the area from water  exposure.  The laceration has impacted her daily life, as she has been cautious about washing her hair and has been wearing a hat to cover the wound. Her husband has been assisting with her hair care, using hydrogen peroxide to clean blood from her hair. She has not experienced significant pain since the initial injury, and her back pain has been less noticeable during this period.  She recalls the incident as traumatic, involving emergency services and a trip to the hospital. She has not had any significant injuries requiring emergency care since childhood.     Review of Systems  Objective:  Physical Exam  Vitals:   05/24/24 1536  BP: 120/80  Pulse: 64  Temp: 97.8 F (36.6 C)  TempSrc: Oral  SpO2: 98%  Weight: 163 lb 6.4 oz (74.1 kg)  Height: 5' 2.25 (1.581 m)    Assessment and Plan Assessment & Plan Forehead laceration, healing post-suturing   The laceration is healing well with no signs of infection and minimal pain. Blood pressure is controlled. She should gently  wash the area with soapy water , avoiding scrubbing. Post-healing, use vitamin E or retinol creams to minimize scarring, which may take months to a year. Wearing a hat or Band-Aids is advised for social situations if desired.

## 2024-05-25 ENCOUNTER — Encounter: Payer: Self-pay | Admitting: Internal Medicine

## 2024-05-26 ENCOUNTER — Other Ambulatory Visit: Payer: Self-pay | Admitting: Internal Medicine

## 2024-06-05 ENCOUNTER — Other Ambulatory Visit: Payer: Self-pay | Admitting: Student in an Organized Health Care Education/Training Program

## 2024-06-05 DIAGNOSIS — M25551 Pain in right hip: Secondary | ICD-10-CM

## 2024-06-05 DIAGNOSIS — Z96641 Presence of right artificial hip joint: Secondary | ICD-10-CM

## 2024-06-05 DIAGNOSIS — M7061 Trochanteric bursitis, right hip: Secondary | ICD-10-CM

## 2024-06-21 ENCOUNTER — Ambulatory Visit
Attending: Student in an Organized Health Care Education/Training Program | Admitting: Student in an Organized Health Care Education/Training Program

## 2024-06-21 ENCOUNTER — Encounter: Payer: Self-pay | Admitting: Student in an Organized Health Care Education/Training Program

## 2024-06-21 VITALS — BP 190/84 | HR 76 | Temp 99.1°F | Resp 16 | Ht 63.75 in | Wt 162.0 lb

## 2024-06-21 DIAGNOSIS — M533 Sacrococcygeal disorders, not elsewhere classified: Secondary | ICD-10-CM | POA: Diagnosis not present

## 2024-06-21 DIAGNOSIS — M25551 Pain in right hip: Secondary | ICD-10-CM | POA: Diagnosis not present

## 2024-06-21 DIAGNOSIS — G5701 Lesion of sciatic nerve, right lower limb: Secondary | ICD-10-CM | POA: Insufficient documentation

## 2024-06-21 DIAGNOSIS — M461 Sacroiliitis, not elsewhere classified: Secondary | ICD-10-CM | POA: Insufficient documentation

## 2024-06-21 DIAGNOSIS — G894 Chronic pain syndrome: Secondary | ICD-10-CM | POA: Insufficient documentation

## 2024-06-21 DIAGNOSIS — G5702 Lesion of sciatic nerve, left lower limb: Secondary | ICD-10-CM | POA: Insufficient documentation

## 2024-06-21 NOTE — Patient Instructions (Signed)
 ______________________________________________________________________    Preparing for your procedure  Appointments: If you think you may not be able to keep your appointment, call 24-48 hours in advance to cancel. We need time to make it available to others.  Procedure visits are for procedures only. During your procedure appointment there will be: NO Prescription Refills*. NO medication changes or discussions*. NO discussion of disability issues*. NO unrelated pain problem evaluations*. NO evaluations to order other pain procedures*. *These will be addressed at a separate and distinct evaluation encounter on the provider's evaluation schedule and not during procedure days.  Instructions: Food intake: Avoid eating anything solid for at least 8 hours prior to your procedure. Clear liquid intake: You may take clear liquids such as water up to 2 hours prior to your procedure. (No carbonated drinks. No soda.) Transportation: Unless otherwise stated by your physician, bring a driver. (Driver cannot be a Market researcher, Pharmacist, community, or any other form of public transportation.) Morning Medicines: Except for blood thinners, take all of your other morning medications with a sip of water. Make sure to take your heart and blood pressure medicines. If your blood pressure's lower number is above 100, the case will be rescheduled. Blood thinners: Make sure to stop your blood thinners as instructed.  If you take a blood thinner, but were not instructed to stop it, call our office [phone] and ask to talk to a nurse. Not stopping a blood thinner prior to certain procedures could lead to serious complications. Diabetics on insulin: Notify the staff so that you can be scheduled 1st case in the morning. If your diabetes requires high dose insulin, take only  of your normal insulin dose the morning of the procedure and notify the staff that you have done so. Preventing infections: Shower with an antibacterial soap the  morning of your procedure.  Build-up your immune system: Take 1000 mg of Vitamin C with every meal (3 times a day) the day prior to your procedure. Antibiotics: Inform the nursing staff if you are taking any antibiotics or if you have any conditions that may require antibiotics prior to procedures. (Example: recent joint implants)   Pregnancy: If you are pregnant make sure to notify the nursing staff. Not doing so may result in injury to the fetus, including death.  Sickness: If you have a cold, fever, or any active infections, call and cancel or reschedule your procedure. Receiving steroids while having an infection may result in complications. Arrival: You must be in the facility at least 30 minutes prior to your scheduled procedure. Tardiness: Your scheduled time is also the cutoff time. If you do not arrive at least 15 minutes prior to your procedure, you will be rescheduled.  Children: Do not bring any children with you. Make arrangements to keep them home. Dress appropriately: There is always a possibility that your clothing may get soiled. Avoid long dresses. Valuables: Do not bring any jewelry or valuables.  Reasons to call and reschedule or cancel your procedure: (Following these recommendations will minimize the risk of a serious complication.) Surgeries: Avoid having procedures within 2 weeks of any surgery. (Avoid for 2 weeks before or after any surgery). Flu Shots: Avoid having procedures within 2 weeks of a flu shots or . (Avoid for 2 weeks before or after immunizations). Barium: Avoid having a procedure within 7-10 days after having had a radiological study involving the use of radiological contrast. (Myelograms, Barium swallow or enema study). Heart attacks: Avoid any elective procedures or surgeries for the  initial 6 months after a "Myocardial Infarction" (Heart Attack). Blood thinners: It is imperative that you stop these medications before procedures. Let us know if you if you take  any blood thinner.  Infection: Avoid procedures during or within two weeks of an infection (including chest colds or gastrointestinal problems). Symptoms associated with infections include: Localized redness, fever, chills, night sweats or profuse sweating, burning sensation when voiding, cough, congestion, stuffiness, runny nose, sore throat, diarrhea, nausea, vomiting, cold or Flu symptoms, recent or current infections. It is specially important if the infection is over the area that we intend to treat. Heart and lung problems: Symptoms that may suggest an active cardiopulmonary problem include: cough, chest pain, breathing difficulties or shortness of breath, dizziness, ankle swelling, uncontrolled high or unusually low blood pressure, and/or palpitations. If you are experiencing any of these symptoms, cancel your procedure and contact your primary care physician for an evaluation.  Remember:  Regular Business hours are:  Monday to Thursday 8:00 AM to 4:00 PM  Provider's Schedule: Delano Metz, MD:  Procedure days: Tuesday and Thursday 7:30 AM to 4:00 PM  Edward Jolly, MD:  Procedure days: Monday and Wednesday 7:30 AM to 4:00 PM Last  Updated: 06/02/2023 ______________________________________________________________________    Sacroiliac (SI) Joint Injection Patient Information  Description: The sacroiliac joint connects the scrum (very low back and tailbone) to the ilium (a pelvic bone which also forms half of the hip joint).  Normally this joint experiences very little motion.  When this joint becomes inflamed or unstable low back and or hip and pelvis pain may result.  Injection of this joint with local anesthetics (numbing medicines) and steroids can provide diagnostic information and reduce pain.  This injection is performed with the aid of x-ray guidance into the tailbone area while you are lying on your stomach.   You may experience an electrical sensation down the leg while this  is being done.  You may also experience numbness.  We also may ask if we are reproducing your normal pain during the injection.  Conditions which may be treated SI injection:  Low back, buttock, hip or leg pain  Preparation for the Injection:  Do not eat any solid food or dairy products within 8 hours of your appointment.  You may drink clear liquids up to 3 hours before appointment.  Clear liquids include water, black coffee, juice or soda.  No milk or cream please. You may take your regular medications, including pain medications with a sip of water before your appointment.  Diabetics should hold regular insulin (if take separately) and take 1/2 normal NPH dose the morning of the procedure.  Carry some sugar containing items with you to your appointment. A driver must accompany you and be prepared to drive you home after your procedure. Bring all of your current medications with you. An IV may be inserted and sedation may be given at the discretion of the physician. A blood pressure cuff, EKG and other monitors will often be applied during the procedure.  Some patients may need to have extra oxygen administered for a short period.  You will be asked to provide medical information, including your allergies, prior to the procedure.  We must know immediately if you are taking blood thinners (like Coumadin/Warfarin) or if you are allergic to IV iodine contrast (dye).  We must know if you could possible be pregnant.  Possible side effects:  Bleeding from needle site Infection (rare, may require surgery) Nerve injury (rare) Numbness &  tingling (temporary) A brief convulsion or seizure Light-headedness (temporary) Pain at injection site (several days) Decreased blood pressure (temporary) Weakness in the leg (temporary)   Call if you experience:  New onset weakness or numbness of an extremity below the injection site that last more than 8 hours. Hives or difficulty breathing ( go to the  emergency room) Inflammation or drainage at the injection site Any new symptoms which are concerning to you  Please note:  Although the local anesthetic injected can often make your back/ hip/ buttock/ leg feel good for several hours after the injections, the pain will likely return.  It takes 3-7 days for steroids to work in the sacroiliac area.  You may not notice any pain relief for at least that one week.  If effective, we will often do a series of three injections spaced 3-6 weeks apart to maximally decrease your pain.  After the initial series, we generally will wait some months before a repeat injection of the same type.  If you have any questions, please call [phone] [facility]

## 2024-06-21 NOTE — Progress Notes (Signed)
 PROVIDER NOTE: Interpretation of information contained herein should be left to medically-trained personnel. Specific patient instructions are provided elsewhere under Patient Instructions section of medical record. This document was created in part using AI and STT-dictation technology, any transcriptional errors that may result from this process are unintentional.  Patient: Audrey [name]  Service: E/M   PCP: Rollene Almarie LABOR, MD  DOB: 1951-01-03  DOS: 06/21/2024  Provider: Wallie Sherry, MD  MRN: [id number]  Delivery: Face-to-face  Specialty: Interventional Pain Management  Type: Established Patient  Setting: Ambulatory outpatient facility  Specialty designation: 09  Referring Prov.: Rollene Almarie LABOR, MD  Location: Outpatient office facility       History of present illness (HPI) Audrey [name], a 73 y.o. year old female, is here today because of her Sacroiliac joint pain [M53.3]. Audrey [name] primary complain today is Hip Pain (Right )   Pain Assessment: Severity of Chronic pain is reported as a 2 /10. Location: Hip Right/denies. Onset: More than a month ago. Quality: Discomfort, Sharp. Timing: Intermittent. Modifying factor(s): sitting down or rest. Vitals:  height is 5' 3.75 (1.619 m) and weight is 162 lb (73.5 kg). Her temporal temperature is 99.1 F (37.3 C). Her blood pressure is 190/84 (abnormal) and her pulse is 76. Her respiration is 16 and oxygen saturation is 98%.  BMI: Estimated body mass index is 28.03 kg/m as calculated from the following:   Height as of this encounter: 5' 3.75 (1.619 m).   Weight as of this encounter: 162 lb (73.5 kg).  Last encounter: 05/10/2024. Last procedure: 11/09/2023.  Reason for encounter:   Discussed the use of AI scribe software for clinical note transcription with the patient, who gave verbal consent to proceed.  History of Present Illness   Audrey [name] is a 73 year old female who presents with right hip and buttock  pain.  She experiences significant right hip pain, described as 'awful', which persists despite engaging in exercises every other day for over a month. These exercises include stretches and weight lifting learned during previous physical therapy sessions.  Previous treatments, including an SI joint injection and a piriformis injection in May, were helpful in managing her symptoms. Her pain significantly impacts her daily activities, such as walking her dog, which she can no longer do regularly.  She recalls a fall caused by tripping over a piece of wood, which led to a CT scan of her head. During the scan, she was given medication that effectively managed her anxiety.       ROS  Constitutional: Denies any fever or chills Gastrointestinal: No reported hemesis, hematochezia, vomiting, or acute GI distress Musculoskeletal: as above Neurological: No reported episodes of acute onset apraxia, aphasia, dysarthria, agnosia, amnesia, paralysis, loss of coordination, or loss of consciousness  Medication Review  amLODipine -valsartan , atorvastatin , cephALEXin , desvenlafaxine , diclofenac , metoprolol  succinate, and mirtazapine   History Review  Allergy: Ms. Polack is allergic to zoloft [sertraline hcl], lisinopril, and wellbutrin [bupropion]. Drug: Ms. Ribaudo  reports current drug use. Drug: Marijuana. Alcohol:  reports current alcohol use of about 9.0 standard drinks of alcohol per week. Tobacco:  reports that she has been smoking cigarettes. She started smoking about 58 years ago. She has a 54.4 pack-year smoking history. She has never used smokeless tobacco. Social: Ms. Walters  reports that she has been smoking cigarettes. She started smoking about 58 years ago. She has a 54.4 pack-year smoking history. She has never used smokeless tobacco. She reports current alcohol use of about  9.0 standard drinks of alcohol per week. She reports current drug use. Drug: Marijuana. Medical:  has a past medical  history of Anxiety, Arthritis, Chicken pox, Depression, Dizziness, Elevated liver enzymes, Fatty liver, Hyperlipidemia, Hypertension, Shingles, and Wears glasses. Surgical: Ms. Wakefield  has a past surgical history that includes Tonsillectomy (1958); Pilonidal cyst excision (1972); Cesarean section (1987, 1989); Dilation and curettage of uterus (1986); Total hip arthroplasty (Right, 10/16/2015); Total hip arthroplasty (Left, 08/19/2016); and Hallux fusion (Right, 05/05/2022). Family: family history includes Arthritis in her father and paternal grandmother; Colon cancer (age of onset: 70) in her maternal aunt; Heart disease in her maternal grandmother and paternal grandfather; Hyperlipidemia in her paternal grandmother; Hypertension in her paternal grandfather and paternal grandmother; Mental illness in her maternal aunt, mother, and paternal grandfather; Multiple sclerosis in her mother.  Laboratory Chemistry Profile   Renal Lab Results  Component Value Date   BUN 12 05/11/2024   CREATININE 0.67 05/11/2024   BCR NOT APPLICABLE 03/19/2018   GFR 87.22 05/11/2024   GFRAA >60 08/20/2016   GFRNONAA >60 08/20/2016    Hepatic Lab Results  Component Value Date   AST 19 05/11/2024   ALT 18 05/11/2024   ALBUMIN 4.7 05/11/2024   ALKPHOS 70 05/11/2024    Electrolytes Lab Results  Component Value Date   NA 138 05/11/2024   K 4.6 05/11/2024   CL 102 05/11/2024   CALCIUM  9.5 05/11/2024    Bone Lab Results  Component Value Date   VD25OH 49 03/19/2018    Inflammation (CRP: Acute Phase) (ESR: Chronic Phase) No results found for: CRP, ESRSEDRATE, LATICACIDVEN       Note: Above Lab results reviewed.  Recent Imaging Review  CT Cervical Spine Wo Contrast EXAM: CT CERVICAL SPINE WITHOUT CONTRAST 05/19/2024 12:27:53 AM  TECHNIQUE: CT of the cervical spine was performed without the administration of intravenous contrast. Multiplanar reformatted images are provided for review. Automated  exposure control, iterative reconstruction, and/or weight based adjustment of the mA/kV was utilized to reduce the radiation dose to as low as reasonably achievable.  COMPARISON: None available.  CLINICAL HISTORY: Facial trauma, blunt.  FINDINGS:  CERVICAL SPINE:  BONES AND ALIGNMENT: No acute fracture or traumatic malalignment.  DEGENERATIVE CHANGES: Degenerative disc disease from C4-C5 through C6-C7 with anterior spurring.  SOFT TISSUES: No prevertebral soft tissue swelling.  IMPRESSION: 1. No acute abnormality of the cervical spine.  Electronically signed by: Franky Crease MD 05/19/2024 12:38 AM EST RP Workstation: [id number] CT Head Wo Contrast EXAM: CT HEAD WITHOUT CONTRAST 05/19/2024 12:27:53 AM  TECHNIQUE: CT of the head was performed without the administration of intravenous contrast. Automated exposure control, iterative reconstruction, and/or weight based adjustment of the mA/kV was utilized to reduce the radiation dose to as low as reasonably achievable.  COMPARISON: None available.  CLINICAL HISTORY: Facial trauma, blunt.  FINDINGS:  BRAIN AND VENTRICLES: No acute hemorrhage. No evidence of acute infarct. No hydrocephalus. No extra-axial collection. No mass effect or midline shift.  ORBITS: No acute abnormality.  SINUSES: No acute abnormality.  SOFT TISSUES AND SKULL: Large laceration across the forehead. No skull fracture.  IMPRESSION: 1. No acute intracranial abnormality. 2. Forehead soft tissue laceration without acute intracranial involvement.  Electronically signed by: Franky Crease MD 05/19/2024 12:36 AM EST RP Workstation: [id number] Note: Reviewed        Physical Exam  Vitals: BP (!) 190/84 (BP Location: Left Arm, Patient Position: Sitting, Cuff Size: Large)   Pulse 76   Temp 99.1 F (37.3 C) (  Temporal)   Resp 16   Ht 5' 3.75 (1.619 m)   Wt 162 lb (73.5 kg)   SpO2 98%   BMI 28.03 kg/m  BMI: Estimated body mass index is  28.03 kg/m as calculated from the following:   Height as of this encounter: 5' 3.75 (1.619 m).   Weight as of this encounter: 162 lb (73.5 kg). Ideal: Ideal body weight: 54.1 kg (119 lb 5.2 oz) Adjusted ideal body weight: 61.9 kg (136 lb 6.3 oz) General appearance: Well nourished, well developed, and well hydrated. In no apparent acute distress Mental status: Alert, oriented x 3 (person, place, & time)       Respiratory: No evidence of acute respiratory distress Eyes: PERLA Coolief yes 3 Coolief.  Hours make me okay but I do not because I can do that for 778 subcu if you wanted to go up the opioids if it has to be open: Right so we can see easier than it to go up to his weight, strongest weight goals for someone like to also take him with seriously as well right so what ever her weight is just a few do this if you want to increase her dose that gives them some initiative as well like okay I like and we can be clear that it is like back And My Note That They Think Ask Your Primary Care Know She Sometimes up to like She Will Think like These Weird Comments and, Just I Will Respond Back As Well like and Then She Will Show, Stop theHappy Next Course to Her to Me Primary Care Yes I Am Just like She Treats Her Sugars Accompanying with All of Her Providers but Herminia It Is Okay You Can Talk with Dr. Lazarus and He Want to Order If You Want to Go up with the Qualification That She Needs to Lose Weight Medical Doctor That Was All the Time Is What Everyone Do Also for  Assessment   Diagnosis Status  1. Sacroiliac joint pain   2. SI joint arthritis   3. Piriformis syndrome, left   4. Right hip pain   5. Piriformis syndrome, right   6. Chronic pain syndrome    Having a Flare-up Having a Flare-up Having a Flare-up   Updated Problems: Problem  Piriformis Syndrome, Right  Right Hip Pain  Sacroiliac Joint Pain  Piriformis Syndrome, Left    Plan of Care  Assessment and Plan    The patient reports  worsening right hip and buttock pain described as severe despite adherence to home stretching and strengthening exercises learned in prior physical therapy. Her pain has become increasingly limiting, particularly with walking and routine activities, and she notes prior meaningful relief from both a right sacroiliac (SI) joint injection and a right piriformis injection performed in May. Given the recurrence and escalation of symptoms following her recent fall, as well as the reproducible nature of her pain pattern, we will proceed with a right SI joint injection and right piriformis injection for diagnostic clarification and therapeutic benefit. Risks and benefits were reviewed, including bleeding, infection, steroid side effects, and the possibility of incomplete or temporary relief. She may continue gentle stretching and activity as tolerated, avoiding heavy lifting and deep twisting until reassessed. We will monitor response closely; if improvement is limited, additional imaging and alternative strategies will be considered. Anxiety related to procedures was discussed, and anxiolytic options can be considered if needed, with a driver required.        Orders Placed  This Encounter  Procedures   SACROILIAC JOINT INJECTION    Physical Examination Findings: Positive Sacral Thrust (Sacral Spring, Downward Pressure): (Y) Positive FABER maneuver (Patrick's): (Y) Positive SI distraction (Gapping): (Y) Positive SI compression (Approximation): (Y) Positive Thigh Thrust:  (Y) Positive Gaenslen's: (Y) Positive Sacral Sulcus Tenderness: (Y)    Standing Status:   Future    Expected Date:   06/27/2024    Expiration Date:   06/21/2025    Scheduling Instructions:     Procedure: Sacroiliac Joint Injection     Side  Laterality: RIGHT     Procedure: Sacroiliac Joint Steroid Injection #1 and Right Piriformis TPI  #1     Laterality: RIGHT       Level: PSIS (Posterior Inferior Iliac Spine)      Imaging:  Fluoroscopic guidance     Anesthesia: Local anesthesia (1-2% Lidocaine )     Sedation: Minimal Sedation    Where will this procedure be performed?:   [facility]     Right L3/4 ESI 07/29/23; Right SIJ and piriformis 09/28/23; Left SIJ and piriformis 11/09/23     Return in about 8 days (around 06/29/2024) for Right SIJ and Right piriformis injection , in clinic (PO Valium  5mg ).    Recent Visits Date Type Provider Dept  05/10/24 Office Visit Marcelino Nurse, MD [facility]  Showing recent visits within past 90 days and meeting all other requirements Today's Visits Date Type Provider Dept  06/21/24 Procedure visit Marcelino Nurse, MD [facility]  Showing today's visits and meeting all other requirements Future Appointments Date Type Provider Dept  06/29/24 Appointment Marcelino Nurse, MD [facility]  Showing future appointments within next 90 days and meeting all other requirements  I discussed the assessment and treatment plan with the patient. The patient was provided an opportunity to ask questions and all were answered. The patient agreed with the plan and demonstrated an understanding of the instructions.  Patient advised to call back or seek an in-person evaluation if the symptoms or condition worsens.  I personally spent a total of 20 minutes in the care of the patient today including preparing to see the patient, getting/reviewing separately obtained history, performing a medically appropriate exam/evaluation, counseling and educating, placing orders, and documenting clinical information in the EHR.   Note by: Nurse Marcelino, MD (TTS and AI technology used. I apologize for any typographical errors that were not detected and corrected.) Date: 06/21/2024; Time: 3:29 PM

## 2024-06-21 NOTE — Progress Notes (Signed)
 Safety precautions to be maintained throughout the outpatient stay will include: orient to surroundings, keep bed in low position, maintain call bell within reach at all times, provide assistance with transfer out of bed and ambulation.

## 2024-06-23 ENCOUNTER — Other Ambulatory Visit: Payer: Self-pay | Admitting: Internal Medicine

## 2024-06-23 DIAGNOSIS — F419 Anxiety disorder, unspecified: Secondary | ICD-10-CM

## 2024-06-23 DIAGNOSIS — E7800 Pure hypercholesterolemia, unspecified: Secondary | ICD-10-CM

## 2024-06-29 ENCOUNTER — Ambulatory Visit
Admission: RE | Admit: 2024-06-29 | Discharge: 2024-06-29 | Disposition: A | Discharge status: Home / Self Care | Source: Ambulatory Visit | Attending: Student in an Organized Health Care Education/Training Program | Admitting: Student in an Organized Health Care Education/Training Program

## 2024-06-29 ENCOUNTER — Ambulatory Visit: Admitting: Student in an Organized Health Care Education/Training Program

## 2024-06-29 ENCOUNTER — Encounter: Payer: Self-pay | Admitting: Student in an Organized Health Care Education/Training Program

## 2024-06-29 VITALS — BP 174/97 | HR 53 | Temp 99.5°F | Resp 20 | Ht 63.0 in | Wt 162.0 lb

## 2024-06-29 DIAGNOSIS — Z981 Arthrodesis status: Secondary | ICD-10-CM | POA: Insufficient documentation

## 2024-06-29 DIAGNOSIS — G5701 Lesion of sciatic nerve, right lower limb: Secondary | ICD-10-CM | POA: Diagnosis not present

## 2024-06-29 DIAGNOSIS — Z79899 Other long term (current) drug therapy: Secondary | ICD-10-CM | POA: Insufficient documentation

## 2024-06-29 DIAGNOSIS — M533 Sacrococcygeal disorders, not elsewhere classified: Secondary | ICD-10-CM

## 2024-06-29 DIAGNOSIS — M461 Sacroiliitis, not elsewhere classified: Secondary | ICD-10-CM | POA: Insufficient documentation

## 2024-06-29 DIAGNOSIS — G894 Chronic pain syndrome: Secondary | ICD-10-CM | POA: Insufficient documentation

## 2024-06-29 DIAGNOSIS — Z96643 Presence of artificial hip joint, bilateral: Secondary | ICD-10-CM | POA: Insufficient documentation

## 2024-06-29 MED ORDER — DIAZEPAM 5 MG PO TABS
ORAL_TABLET | ORAL | Status: AC
  Filled 2024-06-29: qty 1

## 2024-06-29 MED ORDER — METHYLPREDNISOLONE ACETATE 80 MG/ML IJ SUSP
80.0000 mg | Freq: Once | INTRAMUSCULAR | Status: AC
  Administered 2024-06-29: 80 mg via INTRA_ARTICULAR
  Filled 2024-06-29: qty 1

## 2024-06-29 MED ORDER — IOHEXOL 180 MG/ML  SOLN
10.0000 mL | Freq: Once | INTRAMUSCULAR | Status: AC
  Administered 2024-06-29: 10 mL via INTRA_ARTICULAR
  Filled 2024-06-29: qty 20

## 2024-06-29 MED ORDER — DIAZEPAM 5 MG PO TABS
5.0000 mg | ORAL_TABLET | ORAL | Status: AC
  Administered 2024-06-29: 5 mg via ORAL

## 2024-06-29 MED ORDER — LIDOCAINE HCL 2 % IJ SOLN
20.0000 mL | Freq: Once | INTRAMUSCULAR | Status: AC
  Administered 2024-06-29: 200 mg
  Filled 2024-06-29: qty 40

## 2024-06-29 MED ORDER — ROPIVACAINE HCL 2 MG/ML IJ SOLN
9.0000 mL | Freq: Once | INTRAMUSCULAR | Status: AC
  Administered 2024-06-29: 9 mL via INTRA_ARTICULAR
  Filled 2024-06-29: qty 20

## 2024-06-29 NOTE — Patient Instructions (Signed)

## 2024-06-29 NOTE — Progress Notes (Signed)
 PROVIDER NOTE: Interpretation of information contained herein should be left to medically-trained personnel. Specific patient instructions are provided elsewhere under Patient Instructions section of medical record. This document was created in part using STT-dictation technology, any transcriptional errors that may result from this process are unintentional.  Patient: Audrey [name] Type: Established DOB: 1951-06-19 MRN: [id number] PCP: Rollene Almarie LABOR, MD  Service: Procedure DOS: 06/29/2024 Setting: Ambulatory Location: Ambulatory outpatient facility Delivery: Face-to-face Provider: Wallie Sherry, MD Specialty: Interventional Pain Management Specialty designation: 09 Location: Outpatient facility Ref. Prov.: Rollene Almarie LABOR, MD       Interventional Therapy   Procedure: Sacroiliac Joint Steroid Injection and Right Piriformis TPI    Laterality: Right     Level: PSIS (Posterior Inferior Iliac Spine)  Imaging: Fluoroscopic guidance Anesthesia: Local anesthesia (1-2% Lidocaine ) Sedation: 5 mg PO Valium  DOS: 06/29/2024  Performed by: Wallie Sherry, MD  Purpose: Diagnostic/Therapeutic Indications: Sacroiliac joint pain in the lower back and hip area severe enough to impact quality of life or function. Rationale (medical necessity): procedure needed and proper for the diagnosis and/or treatment of Ms. Cherian's medical symptoms and needs. 1. Sacroiliac joint pain   2. SI joint arthritis   3. Piriformis syndrome, right     NAS-11 Pain score:   Pre-procedure: 4/10   Post-procedure: 1 /10     Target: Interarticular sacroiliac joint. Location: Medial to the postero-medial edge of iliac spine. Region: Lumbosacral-sacrococcygeal. Approach: Inferior postero-medial percutaneous approach. Type of procedure: Percutaneous joint injection.  Position / Prep / Materials:  Position: Prone  Prep solution: ChloraPrep (2% chlorhexidine  gluconate and 70% isopropyl alcohol) Prep Area:  Entire posterior lumbosacral area  Materials:  Tray: Block Needle(s):  Type: Spinal  Gauge (G): 22  Length: 3.5-in Qty: 1  H&P (Pre-op Assessment):  Audrey [name] is a 74 y.o. (year old), female patient, seen today for interventional treatment. She  has a past surgical history that includes Tonsillectomy (1958); Pilonidal cyst excision (1972); Cesarean section (1987, 1989); Dilation and curettage of uterus (1986); Total hip arthroplasty (Right, 10/16/2015); Total hip arthroplasty (Left, 08/19/2016); and Hallux fusion (Right, 05/05/2022). Ms. Bhatt has a current medication list which includes the following prescription(s): amlodipine -valsartan , atorvastatin , desvenlafaxine , metoprolol  succinate, mirtazapine , cephalexin , and diclofenac . Her primarily concern today is the Hip Pain (right)  Initial Vital Signs:  Pulse/HCG Rate: (!) 53ECG Heart Rate: 65 Temp: 99.5 F (37.5 C) Resp: 18 BP: (!) 164/80 SpO2: 100 %  BMI: Estimated body mass index is 28.7 kg/m as calculated from the following:   Height as of this encounter: 5' 3 (1.6 m).   Weight as of this encounter: 162 lb (73.5 kg).  Risk Assessment: Allergies: Reviewed. She is allergic to zoloft [sertraline hcl], lisinopril, and wellbutrin [bupropion].  Allergy Precautions: None required Coagulopathies: Reviewed. None identified.  Blood-thinner therapy: None at this time Active Infection(s): Reviewed. None identified. Audrey [name] is afebrile  Site Confirmation: Ms. Pilling was asked to confirm the procedure and laterality before marking the site Procedure checklist: Completed Consent: Before the procedure and under the influence of no sedative(s), amnesic(s), or anxiolytics, the patient was informed of the treatment options, risks and possible complications. To fulfill our ethical and legal obligations, as recommended by the American Medical Association's Code of Ethics, I have informed the patient of my clinical impression; the nature  and purpose of the treatment or procedure; the risks, benefits, and possible complications of the intervention; the alternatives, including doing nothing; the risk(s) and benefit(s) of the alternative treatment(s) or procedure(s); and the  risk(s) and benefit(s) of doing nothing. The patient was provided information about the general risks and possible complications associated with the procedure. These may include, but are not limited to: failure to achieve desired goals, infection, bleeding, organ or nerve damage, allergic reactions, paralysis, and death. In addition, the patient was informed of those risks and complications associated to the procedure, such as failure to decrease pain; infection; bleeding; organ or nerve damage with subsequent damage to sensory, motor, and/or autonomic systems, resulting in permanent pain, numbness, and/or weakness of one or several areas of the body; allergic reactions; (i.e.: anaphylactic reaction); and/or death. Furthermore, the patient was informed of those risks and complications associated with the medications. These include, but are not limited to: allergic reactions (i.e.: anaphylactic or anaphylactoid reaction(s)); adrenal axis suppression; blood sugar elevation that in diabetics may result in ketoacidosis or comma; water  retention that in patients with history of congestive heart failure may result in shortness of breath, pulmonary edema, and decompensation with resultant heart failure; weight gain; swelling or edema; medication-induced neural toxicity; particulate matter embolism and blood vessel occlusion with resultant organ, and/or nervous system infarction; and/or aseptic necrosis of one or more joints. Finally, the patient was informed that Medicine is not an exact science; therefore, there is also the possibility of unforeseen or unpredictable risks and/or possible complications that may result in a catastrophic outcome. The patient indicated having understood  very clearly. We have given the patient no guarantees and we have made no promises. Enough time was given to the patient to ask questions, all of which were answered to the patient's satisfaction. Ms. Digilio has indicated that she wanted to continue with the procedure. Attestation: I, the ordering provider, attest that I have discussed with the patient the benefits, risks, side-effects, alternatives, likelihood of achieving goals, and potential problems during recovery for the procedure that I have provided informed consent. Date  Time: 06/29/2024  1:53 PM  Pre-Procedure Preparation:  Monitoring: As per clinic protocol. Respiration, ETCO2, SpO2, BP, heart rate and rhythm monitor placed and checked for adequate function Safety Precautions: Patient was assessed for positional comfort and pressure points before starting the procedure. Time-out: I initiated and conducted the Time-out before starting the procedure, as per protocol. The patient was asked to participate by confirming the accuracy of the Time Out information. Verification of the correct person, site, and procedure were performed and confirmed by me, the nursing staff, and the patient. Time-out conducted as per Joint Commission's Universal Protocol (UP.01.01.01). Time: 1435 Start Time: 1435 hrs.  Description/Narrative of Procedure:          Start Time: 1435 hrs.  Rationale (medical necessity): procedure needed and proper for the diagnosis and/or treatment of the patient's medical symptoms and needs. Procedural Technique Safety Precautions: Aspiration looking for blood return was conducted prior to all injections. At no point did we inject any substances, as a needle was being advanced. No attempts were made at seeking any paresthesias. Safe injection practices and needle disposal techniques used. Medications properly checked for expiration dates. SDV (single dose vial) medications used. Description of the Procedure: Protocol guidelines  were followed. The patient was assisted into a comfortable position. The target area was identified and the area prepped in the usual manner. Skin & deeper tissues infiltrated with local anesthetic. Appropriate amount of time allowed to pass for local anesthetics to take effect. The procedure needles were then advanced to the target area. Proper needle placement secured. Negative aspiration confirmed. Solution injected in intermittent fashion,  asking for systemic symptoms every 0.5cc of injectate. The needles were then removed and the area cleansed, making sure to leave some of the prepping solution back to take advantage of its long term bactericidal properties.  Technical description of procedure:  Fluoroscopy using a posterior anterior 45 degree angle from the midline aiming at the anterolateral aspect of the patient was used to find a direct path into the sacroiliac joint, the superior medial to posterior superior iliac spine.  The skin was marked where the desired target and the skin infiltrated with local anesthetics.  The procedure needle was then advanced until the joint was entered.  Once inside of the joint, we then proceeded to inject the desired solution.  10 cc solution made of 9 cc of 0.2% ropivacaine , 1 cc of methylprednisolone , 80 mg/cc.  5 cc injected into the right SI joint.   Afterwards a right  piriformis trigger point injection was done 1 cm inferior, 1 cm deep, 1 cm lateral to the inferior fissure of the SI joint.  Contrast was injected to confirm piriformis muscle striation.  While injecting, patient did not complain of any pain radiating down his leg.  5 cc of above nerve block solution injected into the right piriformis.     Vitals:   06/29/24 1417 06/29/24 1429 06/29/24 1437 06/29/24 1439  BP: (!) 164/80 (!) 174/89 (!) 153/93 (!) 174/97  Pulse: (!) 53     Resp: 18 (!) 22 (!) 21 20  Temp: 99.5 F (37.5 C)     TempSrc: Temporal     SpO2: 100% 100% 100% 100%  Weight: 162 lb  (73.5 kg)     Height: 5' 3 (1.6 m)         End Time: 1438 hrs.  Imaging Guidance (Non-Spinal):          Type of Imaging Technique: Fluoroscopy Guidance (Non-Spinal) Indication(s): Fluoroscopy guidance for needle placement to enhance accuracy in procedures requiring precise needle localization for targeted delivery of medication in or near specific anatomical locations not easily accessible without such real-time imaging assistance. Exposure Time: Please see nurses notes. Contrast: Before injecting any contrast, we confirmed that the patient did not have an allergy to iodine, shellfish, or radiological contrast. Once satisfactory needle placement was completed at the desired level, radiological contrast was injected. Contrast injected under live fluoroscopy. No contrast complications. See chart for type and volume of contrast used. Fluoroscopic Guidance: I was personally present during the use of fluoroscopy. Tunnel Vision Technique used to obtain the best possible view of the target area. Parallax error corrected before commencing the procedure. Direction-depth-direction technique used to introduce the needle under continuous pulsed fluoroscopy. Once target was reached, antero-posterior, oblique, and lateral fluoroscopic projection used confirm needle placement in all planes. Images permanently stored in EMR. Interpretation: I personally interpreted the imaging intraoperatively. Adequate needle placement confirmed in multiple planes. Appropriate spread of contrast into desired area was observed. No evidence of afferent or efferent intravascular uptake. Permanent images saved into the patient's record.  Post-operative Assessment:  Post-procedure Vital Signs:  Pulse/HCG Rate: (!) 5362 Temp: 99.5 F (37.5 C) Resp: 20 BP: (!) 174/97 SpO2: 100 %  EBL: None  Complications: No immediate post-treatment complications observed by team, or reported by patient.  Note: The patient tolerated the  entire procedure well. A repeat set of vitals were taken after the procedure and the patient was kept under observation following institutional policy, for this type of procedure. Post-procedural neurological assessment was performed, showing return  to baseline, prior to discharge. The patient was provided with post-procedure discharge instructions, including a section on how to identify potential problems. Should any problems arise concerning this procedure, the patient was given instructions to immediately contact us , at any time, without hesitation. In any case, we plan to contact the patient by telephone for a follow-up status report regarding this interventional procedure.  Comments:  No additional relevant information.  Plan of Care (POC)  Orders:  Orders Placed This Encounter  Procedures   DG PAIN CLINIC C-ARM 1-60 MIN NO REPORT    Intraoperative interpretation by procedural physician at [facility].    Standing Status:   Standing    Number of Occurrences:   1    Reason for exam::   Assistance in needle guidance and placement for procedures requiring needle placement in or near specific anatomical locations not easily accessible without such assistance.     Medications ordered for procedure: Meds ordered this encounter  Medications   iohexol  (OMNIPAQUE ) 180 MG/ML injection 10 mL    Must be Myelogram-compatible. If not available, you may substitute with a water -soluble, non-ionic, hypoallergenic, myelogram-compatible radiological contrast medium.   lidocaine  (XYLOCAINE ) 2 % (with pres) injection 400 mg   methylPREDNISolone  acetate (DEPO-MEDROL ) injection 80 mg   ropivacaine  (PF) 2 mg/mL (0.2%) (NAROPIN ) injection 9 mL   diazepam  (VALIUM ) tablet 5 mg    Make sure Flumazenil is available in the pyxis when using this medication. If oversedation occurs, administer 0.2 mg IV over 15 sec. If after 45 sec no response, administer 0.2 mg again over 1 min; may repeat at 1 min  intervals; not to exceed 4 doses (1 mg)   Medications administered: We administered iohexol , lidocaine , methylPREDNISolone  acetate, ropivacaine  (PF) 2 mg/mL (0.2%), and diazepam .  See the medical record for exact dosing, route, and time of administration.  Follow-up plan:   Return in about 6 weeks (around 08/10/2024) for PPE VV .     Recent Visits Date Type Provider Dept  06/21/24 Procedure visit Marcelino Nurse, MD [facility]  05/10/24 Office Visit Marcelino Nurse, MD [facility]  Showing recent visits within past 90 days and meeting all other requirements Today's Visits Date Type Provider Dept  06/29/24 Procedure visit Marcelino Nurse, MD [facility]  Showing today's visits and meeting all other requirements Future Appointments No visits were found meeting these conditions. Showing future appointments within next 90 days and meeting all other requirements  Disposition: Discharge home  Discharge (Date  Time): 06/29/2024; 1443 hrs.   Primary Care Physician: Rollene Almarie LABOR, MD Location: [facility] Note by: Nurse Marcelino, MD (TTS technology used. I apologize for any typographical errors that were not detected and corrected.) Date: 06/29/2024; Time: 2:44 PM  Disclaimer:  Medicine is not an visual merchandiser. The only guarantee in medicine is that nothing is guaranteed. It is important to note that the decision to proceed with this intervention was based on the information collected from the patient. The Data and conclusions were drawn from the patient's questionnaire, the interview, and the physical examination. Because the information was provided in large part by the patient, it cannot be guaranteed that it has not been purposely or unconsciously manipulated. Every effort has been made to obtain as much relevant data as possible for this evaluation. It is important to note that the conclusions that lead to this procedure are derived  in large part from the available data. Always take into account that the  treatment will also be dependent on availability of resources and existing treatment guidelines, considered by other Pain Management Practitioners as being common knowledge and practice, at the time of the intervention. For Medico-Legal purposes, it is also important to point out that variation in procedural techniques and pharmacological choices are the acceptable norm. The indications, contraindications, technique, and results of the above procedure should only be interpreted and judged by a Board-Certified Interventional Pain Specialist with extensive familiarity and expertise in the same exact procedure and technique.

## 2024-06-30 ENCOUNTER — Ambulatory Visit
Admission: RE | Admit: 2024-06-30 | Discharge: 2024-06-30 | Disposition: A | Discharge status: Home / Self Care | Source: Ambulatory Visit | Attending: Emergency Medicine | Admitting: Emergency Medicine

## 2024-06-30 ENCOUNTER — Telehealth: Payer: Self-pay

## 2024-06-30 VITALS — BP 166/89 | HR 76 | Temp 98.7°F | Resp 19

## 2024-06-30 DIAGNOSIS — R350 Frequency of micturition: Secondary | ICD-10-CM | POA: Insufficient documentation

## 2024-06-30 LAB — POCT URINE DIPSTICK
Bilirubin, UA: NEGATIVE
Glucose, UA: NEGATIVE mg/dL
Ketones, POC UA: NEGATIVE mg/dL
Nitrite, UA: NEGATIVE
Protein Ur, POC: NEGATIVE mg/dL
Spec Grav, UA: 1.015
Urobilinogen, UA: 0.2 U/dL
pH, UA: 5.5

## 2024-06-30 MED ORDER — PHENAZOPYRIDINE HCL 200 MG PO TABS: 200.0000 mg | ORAL_TABLET | Freq: Three times a day (TID) | ORAL | 0 refills | Status: AC

## 2024-06-30 MED ORDER — NITROFURANTOIN MONOHYD MACRO 100 MG PO CAPS: 100.0000 mg | ORAL_CAPSULE | Freq: Two times a day (BID) | ORAL | 0 refills | Status: AC

## 2024-06-30 NOTE — ED Provider Notes (Signed)
 " CAY RALPH PELT    CSN: [id number] Arrival date & time: 06/30/24  1538      History   Chief Complaint Chief Complaint  Patient presents with   Urinary Frequency    Possible uti - Entered by patient    HPI Audrey [name] is a 74 y.o. female.   Patient presents for evaluation of intermittent urinary frequency and dysuria present for 7 days.  Has attempted use of Advil .  Denies flank or abdominal pain, fever or vaginal symptoms.  Denies hematuria.     Past Medical History:  Diagnosis Date   Anxiety    Arthritis    Chicken pox    Depression    Dizziness    when bending over    Elevated liver enzymes    Fatty liver    Hyperlipidemia    Hypertension    Shingles    Wears glasses     Patient Active Problem List   Diagnosis Date Noted   Piriformis syndrome, right 06/21/2024   Trochanteric bursitis of right hip 05/10/2024   Right hip pain 05/10/2024   Sacroiliac joint pain 11/03/2023   SI joint arthritis 11/03/2023   Piriformis syndrome, left 11/03/2023   Spinal stenosis, lumbar region, with neurogenic claudication 07/21/2023   Degeneration of intervertebral disc of lumbar region with discogenic back pain 07/21/2023   Chronic pain syndrome 07/21/2023   Discharge from right nipple 06/27/2022   Routine general medical examination at a health care facility 04/23/2022   History of right hip replacement 08/19/2016   Anxiety and depression 11/13/2014   HLD (hyperlipidemia) 11/13/2014   Essential hypertension 11/13/2014    Past Surgical History:  Procedure Laterality Date   CESAREAN SECTION  1987, 1989   DILATION AND CURETTAGE OF UTERUS  1986   HALLUX FUSION Right 05/05/2022   Procedure: JULIOUS LIGHT, FIRST METATASALPHALANGEAL JOINT;  Surgeon: Tobie Franky SQUIBB, DPM;  Location: [facility];  Service: Podiatry;  Laterality: Right;  CHOICE WITH BLOCK   PILONIDAL CYST EXCISION  1972   TONSILLECTOMY  1958   TOTAL HIP ARTHROPLASTY Right 10/16/2015    Procedure: RIGHT TOTAL HIP ARTHROPLASTY ANTERIOR APPROACH;  Surgeon: Donnice Car, MD;  Location: [facility];  Service: Orthopedics;  Laterality: Right;   TOTAL HIP ARTHROPLASTY Left 08/19/2016   Procedure: LEFT TOTAL HIP ARTHROPLASTY ANTERIOR APPROACH;  Surgeon: Donnice Car, MD;  Location: [facility];  Service: Orthopedics;  Laterality: Left;  requests 70 mins    OB History   No obstetric history on file.      Home Medications    Prior to Admission medications  Medication Sig Start Date End Date Taking? Authorizing Provider  amLODipine -valsartan  (EXFORGE ) 5-320 MG tablet Take 1 tablet by mouth daily. 05/01/23   Rollene Almarie LABOR, MD  atorvastatin  (LIPITOR) 20 MG tablet TAKE 1 TABLET BY MOUTH AT BEDTIME 06/24/24   Rollene Almarie LABOR, MD  cephALEXin  (KEFLEX ) 500 MG capsule Take 1 capsule (500 mg total) by mouth 2 (two) times daily. Patient not taking: Reported on 06/21/2024 05/19/24   Vicky Charleston, PA-C  desvenlafaxine  (PRISTIQ ) 100 MG 24 hr tablet TAKE 1 TABLET BY MOUTH DAILY 04/15/24   Rollene Almarie LABOR, MD  diclofenac  (VOLTAREN ) 75 MG EC tablet Take 1 tablet (75 mg total) by mouth 2 (two) times daily. Patient not taking: Reported on 06/21/2024 05/10/24   Marcelino Nurse, MD  metoprolol  succinate (TOPROL -XL) 100 MG 24 hr tablet TAKE 1 TABLET BY MOUTH DAILY 05/26/24   Rollene Almarie LABOR, MD  mirtazapine  (REMERON ) 15 MG tablet TAKE 1 TABLET BY MOUTH AT BEDTIME. 06/24/24   Rollene Almarie LABOR, MD    Family History Family History  Problem Relation Age of Onset   Multiple sclerosis Mother    Mental illness Mother    Arthritis Father    Mental illness Maternal Aunt    Colon cancer Maternal Aunt 35   Heart disease Maternal Grandmother    Arthritis Paternal Grandmother    Hyperlipidemia Paternal Grandmother    Hypertension Paternal Grandmother    Heart disease Paternal Grandfather    Hypertension Paternal Grandfather    Mental illness Paternal Grandfather    Stomach cancer Neg  Hx    Rectal cancer Neg Hx    Esophageal cancer Neg Hx     Social History Social History[1]   Allergies   Zoloft [sertraline hcl], Lisinopril, and Wellbutrin [bupropion]   Review of Systems Review of Systems  Constitutional: Negative.   Genitourinary:  Positive for dysuria and frequency. Negative for decreased urine volume, difficulty urinating, dyspareunia, enuresis, flank pain, genital sores, hematuria, menstrual problem, pelvic pain, urgency, vaginal bleeding, vaginal discharge and vaginal pain.  Skin: Negative.   Neurological: Negative.      Physical Exam Triage Vital Signs ED Triage Vitals  Encounter Vitals Group     BP      Girls Systolic BP Percentile      Girls Diastolic BP Percentile      Boys Systolic BP Percentile      Boys Diastolic BP Percentile      Pulse      Resp      Temp      Temp src      SpO2      Weight      Height      Head Circumference      Peak Flow      Pain Score      Pain Loc      Pain Education      Exclude from Growth Chart    No data found.  Updated Vital Signs There were no vitals taken for this visit.  Visual Acuity Right Eye Distance:   Left Eye Distance:   Bilateral Distance:    Right Eye Near:   Left Eye Near:    Bilateral Near:     Physical Exam Constitutional:      Appearance: Normal appearance.  Eyes:     Extraocular Movements: Extraocular movements intact.  Pulmonary:     Effort: Pulmonary effort is normal.  Abdominal:     Tenderness: There is no abdominal tenderness. There is no right CVA tenderness, left CVA tenderness or guarding.  Neurological:     Mental Status: She is alert and oriented to person, place, and time.      UC Treatments / Results  Labs (all labs ordered are listed, but only abnormal results are displayed) Labs Reviewed  URINE CULTURE  POCT URINE DIPSTICK    EKG   Radiology DG PAIN CLINIC C-ARM 1-60 MIN NO REPORT Result Date: 06/29/2024 Fluoro was used, but no Radiologist  interpretation will be provided. Please refer to NOTES tab for provider progress note.   Procedures Procedures (including critical care time)  Medications Ordered in UC Medications - No data to display  Initial Impression / Assessment and Plan / UC Course  I have reviewed the triage vital signs and the nursing notes.  Pertinent labs & imaging results that were available during my care of the patient were  reviewed by me and considered in my medical decision making (see chart for details).  Urinary frequency  Urinalysis showed leukocytes, negative for nitrates, sent for culture empirically placed on Macrobid  if she is symptomatic, additionally prescribed Pyridium  for comfort, recommended nonpharmacological supportive care with follow-up as needed Final Clinical Impressions(s) / UC Diagnoses   Final diagnoses:  Urinary frequency   Discharge Instructions   None    ED Prescriptions   None    PDMP not reviewed this encounter.     [1]  Social History Tobacco Use   Smoking status: Every Day    Current packs/day: 0.50    Average packs/day: 0.9 packs/day for 58.8 years (54.4 ttl pk-yrs)    Types: Cigarettes    Start date: 09/22/1965    Last attempt to quit: 09/23/2015   Smokeless tobacco: Never  Vaping Use   Vaping status: Never Used  Substance Use Topics   Alcohol use: Yes    Alcohol/week: 9.0 standard drinks of alcohol    Types: 3 Glasses of wine, 6 Cans of beer per week   Drug use: Yes    Types: Marijuana    Comment: gummies     Teresa Shelba SAUNDERS, NP 06/30/24 1635  "

## 2024-06-30 NOTE — Telephone Encounter (Signed)
Post procedure follow up.  Patient states she is doing great.

## 2024-06-30 NOTE — Discharge Instructions (Addendum)
 Your urinalysis shows Sota Hetz blood cells but at the moment does not show bacteria your urine will be sent to the lab to determine exactly which bacteria is present, if any changes need to be made to your medications you will be notified  Begin use of Macrobid  twice a day for 5 days because you are symptomatic  You may use Pyridium  every 8 hours to help with bladder discomfort, will turn urine a bright orange  Increase your fluid intake through use of water   As always practice good hygiene, wiping front to back and avoidance of scented vaginal products to prevent further irritation  If symptoms continue to persist after use of medication or recur please follow-up with urgent care or your primary doctor as needed

## 2024-06-30 NOTE — ED Notes (Signed)
 Patient triage by  Teresa Shelba SAUNDERS, NP

## 2024-07-02 LAB — URINE CULTURE: Culture: 90000 — AB

## 2024-07-04 ENCOUNTER — Ambulatory Visit (HOSPITAL_COMMUNITY): Payer: Self-pay

## 2024-07-05 ENCOUNTER — Other Ambulatory Visit: Payer: Self-pay | Admitting: Internal Medicine

## 2024-07-05 DIAGNOSIS — I10 Essential (primary) hypertension: Secondary | ICD-10-CM

## 2024-07-05 DIAGNOSIS — F32A Depression, unspecified: Secondary | ICD-10-CM

## 2024-07-13 ENCOUNTER — Encounter: Payer: Self-pay | Admitting: *Deleted

## 2024-07-13 NOTE — Progress Notes (Signed)
 LURLEAN KERNEN                                          MRN: [id number]   07/13/2024   The [facility] Quality Team Specialist reviewed this patient medical record for the purposes of chart review for care gap closure. The following were reviewed: abstraction for care gap closure-controlling blood pressure.    [facility] Quality Team

## 2024-08-09 ENCOUNTER — Telehealth: Admitting: Student in an Organized Health Care Education/Training Program

## 2025-05-12 ENCOUNTER — Encounter: Admitting: Internal Medicine

## 2025-05-15 ENCOUNTER — Encounter: Admitting: Internal Medicine
